# Patient Record
Sex: Male | Born: 1950 | Race: White | Hispanic: No | Marital: Married | Smoking: Former smoker
Health system: Southern US, Community
[De-identification: ages and names within clinical notes are randomized; demographics above are authoritative.]

## PROBLEM LIST (undated history)

## (undated) DIAGNOSIS — R7989 Other specified abnormal findings of blood chemistry: Secondary | ICD-10-CM

## (undated) DIAGNOSIS — H9190 Unspecified hearing loss, unspecified ear: Secondary | ICD-10-CM

## (undated) DIAGNOSIS — D126 Benign neoplasm of colon, unspecified: Secondary | ICD-10-CM

## (undated) DIAGNOSIS — R945 Abnormal results of liver function studies: Secondary | ICD-10-CM

## (undated) DIAGNOSIS — I10 Essential (primary) hypertension: Secondary | ICD-10-CM

## (undated) DIAGNOSIS — E669 Obesity, unspecified: Secondary | ICD-10-CM

## (undated) DIAGNOSIS — M1990 Unspecified osteoarthritis, unspecified site: Secondary | ICD-10-CM

## (undated) DIAGNOSIS — G4733 Obstructive sleep apnea (adult) (pediatric): Secondary | ICD-10-CM

## (undated) DIAGNOSIS — N400 Enlarged prostate without lower urinary tract symptoms: Secondary | ICD-10-CM

## (undated) DIAGNOSIS — E785 Hyperlipidemia, unspecified: Secondary | ICD-10-CM

## (undated) DIAGNOSIS — C4491 Basal cell carcinoma of skin, unspecified: Secondary | ICD-10-CM

## (undated) DIAGNOSIS — L718 Other rosacea: Secondary | ICD-10-CM

## (undated) HISTORY — DX: Unspecified osteoarthritis, unspecified site: M19.90

## (undated) HISTORY — DX: Obesity, unspecified: E66.9

## (undated) HISTORY — DX: Obstructive sleep apnea (adult) (pediatric): G47.33

## (undated) HISTORY — DX: Essential (primary) hypertension: I10

## (undated) HISTORY — DX: Benign neoplasm of colon, unspecified: D12.6

## (undated) HISTORY — DX: Benign prostatic hyperplasia without lower urinary tract symptoms: N40.0

## (undated) HISTORY — DX: Hyperlipidemia, unspecified: E78.5

## (undated) HISTORY — DX: Basal cell carcinoma of skin, unspecified: C44.91

## (undated) HISTORY — DX: Other rosacea: L71.8

## (undated) HISTORY — DX: Other specified abnormal findings of blood chemistry: R79.89

## (undated) HISTORY — DX: Unspecified hearing loss, unspecified ear: H91.90

## (undated) HISTORY — DX: Abnormal results of liver function studies: R94.5

## (undated) HISTORY — PX: TONSILLECTOMY: SUR1361

---

## 2004-01-22 LAB — HM COLONOSCOPY

## 2005-01-04 ENCOUNTER — Ambulatory Visit: Payer: Self-pay | Admitting: Cardiology

## 2005-03-08 ENCOUNTER — Ambulatory Visit: Payer: Self-pay | Admitting: Cardiology

## 2005-06-14 ENCOUNTER — Ambulatory Visit: Payer: Self-pay | Admitting: Cardiology

## 2007-01-09 ENCOUNTER — Ambulatory Visit: Payer: Self-pay | Admitting: Cardiology

## 2007-02-20 ENCOUNTER — Ambulatory Visit: Payer: Self-pay

## 2007-02-20 LAB — CONVERTED CEMR LAB
ALT: 77 units/L — ABNORMAL HIGH (ref 0–53)
AST: 47 units/L — ABNORMAL HIGH (ref 0–37)
Albumin: 4.1 g/dL (ref 3.5–5.2)
Alkaline Phosphatase: 44 units/L (ref 39–117)
BUN: 12 mg/dL (ref 6–23)
Calcium: 9.3 mg/dL (ref 8.4–10.5)
Chloride: 108 meq/L (ref 96–112)
Creatinine, Ser: 0.9 mg/dL (ref 0.4–1.5)
GFR calc non Af Amer: 92 mL/min
LDL Cholesterol: 67 mg/dL (ref 0–99)
PSA: 0.53 ng/mL (ref 0.10–4.00)
VLDL: 13 mg/dL (ref 0–40)

## 2007-03-27 ENCOUNTER — Ambulatory Visit: Payer: Self-pay | Admitting: Cardiology

## 2007-03-27 LAB — CONVERTED CEMR LAB
ALT: 75 units/L — ABNORMAL HIGH (ref 0–53)
AST: 51 units/L — ABNORMAL HIGH (ref 0–37)
Bilirubin, Direct: 0.1 mg/dL (ref 0.0–0.3)
Total Bilirubin: 0.9 mg/dL (ref 0.3–1.2)
Total Protein: 7 g/dL (ref 6.0–8.3)

## 2007-09-25 ENCOUNTER — Ambulatory Visit: Payer: Self-pay | Admitting: Cardiology

## 2008-05-11 DIAGNOSIS — E785 Hyperlipidemia, unspecified: Secondary | ICD-10-CM

## 2008-05-11 DIAGNOSIS — R030 Elevated blood-pressure reading, without diagnosis of hypertension: Secondary | ICD-10-CM | POA: Insufficient documentation

## 2008-05-13 ENCOUNTER — Encounter: Payer: Self-pay | Admitting: Cardiology

## 2008-05-13 ENCOUNTER — Ambulatory Visit: Payer: Self-pay | Admitting: Cardiology

## 2008-05-20 ENCOUNTER — Ambulatory Visit: Payer: Self-pay | Admitting: Cardiology

## 2008-05-23 ENCOUNTER — Encounter (INDEPENDENT_AMBULATORY_CARE_PROVIDER_SITE_OTHER): Payer: Self-pay | Admitting: *Deleted

## 2008-05-23 LAB — CONVERTED CEMR LAB
AST: 40 units/L — ABNORMAL HIGH (ref 0–37)
Albumin: 4 g/dL (ref 3.5–5.2)
Cholesterol: 130 mg/dL (ref 0–200)
Triglycerides: 109 mg/dL (ref 0.0–149.0)
VLDL: 21.8 mg/dL (ref 0.0–40.0)

## 2008-06-03 ENCOUNTER — Ambulatory Visit: Payer: Self-pay | Admitting: Internal Medicine

## 2008-06-03 DIAGNOSIS — M722 Plantar fascial fibromatosis: Secondary | ICD-10-CM | POA: Insufficient documentation

## 2008-06-03 DIAGNOSIS — M1990 Unspecified osteoarthritis, unspecified site: Secondary | ICD-10-CM

## 2008-08-01 ENCOUNTER — Telehealth: Payer: Self-pay | Admitting: Cardiology

## 2008-08-12 ENCOUNTER — Ambulatory Visit: Payer: Self-pay | Admitting: Cardiology

## 2008-08-15 ENCOUNTER — Encounter: Payer: Self-pay | Admitting: Cardiology

## 2008-08-19 ENCOUNTER — Telehealth: Payer: Self-pay | Admitting: Cardiology

## 2008-12-09 ENCOUNTER — Ambulatory Visit: Payer: Self-pay | Admitting: Internal Medicine

## 2008-12-09 DIAGNOSIS — R945 Abnormal results of liver function studies: Secondary | ICD-10-CM

## 2008-12-14 LAB — CONVERTED CEMR LAB
ALT: 65 units/L — ABNORMAL HIGH (ref 0–53)
AST: 41 units/L — ABNORMAL HIGH (ref 0–37)
BUN: 14 mg/dL (ref 6–23)
Creatinine, Ser: 1 mg/dL (ref 0.4–1.5)
GFR calc non Af Amer: 80.84 mL/min (ref >60)
HDL: 36.5 mg/dL — ABNORMAL LOW (ref >39.00)
Total CK: 480 units/L — ABNORMAL HIGH (ref 7–232)
VLDL: 28 mg/dL (ref 0.0–40.0)

## 2009-01-27 ENCOUNTER — Ambulatory Visit: Payer: Self-pay | Admitting: Internal Medicine

## 2009-01-30 LAB — CONVERTED CEMR LAB
ALT: 60 units/L — ABNORMAL HIGH (ref 0–53)
Total CHOL/HDL Ratio: 5

## 2009-03-24 ENCOUNTER — Telehealth (INDEPENDENT_AMBULATORY_CARE_PROVIDER_SITE_OTHER): Payer: Self-pay | Admitting: *Deleted

## 2009-06-09 ENCOUNTER — Ambulatory Visit: Payer: Self-pay | Admitting: Internal Medicine

## 2009-06-09 LAB — CONVERTED CEMR LAB
ALT: 64 units/L — ABNORMAL HIGH (ref 0–53)
AST: 41 units/L — ABNORMAL HIGH (ref 0–37)

## 2009-06-13 ENCOUNTER — Telehealth: Payer: Self-pay | Admitting: Internal Medicine

## 2009-06-13 DIAGNOSIS — H9190 Unspecified hearing loss, unspecified ear: Secondary | ICD-10-CM | POA: Insufficient documentation

## 2009-06-15 ENCOUNTER — Encounter (INDEPENDENT_AMBULATORY_CARE_PROVIDER_SITE_OTHER): Payer: Self-pay | Admitting: *Deleted

## 2009-07-26 ENCOUNTER — Encounter: Payer: Self-pay | Admitting: Internal Medicine

## 2009-08-06 ENCOUNTER — Encounter: Payer: Self-pay | Admitting: Internal Medicine

## 2009-08-07 ENCOUNTER — Encounter: Payer: Self-pay | Admitting: Internal Medicine

## 2009-08-09 ENCOUNTER — Telehealth (INDEPENDENT_AMBULATORY_CARE_PROVIDER_SITE_OTHER): Payer: Self-pay | Admitting: *Deleted

## 2009-09-14 ENCOUNTER — Telehealth (INDEPENDENT_AMBULATORY_CARE_PROVIDER_SITE_OTHER): Payer: Self-pay | Admitting: *Deleted

## 2009-09-15 DIAGNOSIS — G4733 Obstructive sleep apnea (adult) (pediatric): Secondary | ICD-10-CM

## 2009-09-22 ENCOUNTER — Ambulatory Visit: Payer: Self-pay | Admitting: Cardiology

## 2009-09-22 DIAGNOSIS — R351 Nocturia: Secondary | ICD-10-CM | POA: Insufficient documentation

## 2009-10-03 ENCOUNTER — Telehealth (INDEPENDENT_AMBULATORY_CARE_PROVIDER_SITE_OTHER): Payer: Self-pay | Admitting: *Deleted

## 2009-11-10 ENCOUNTER — Ambulatory Visit: Payer: Self-pay | Admitting: Cardiology

## 2009-11-14 LAB — CONVERTED CEMR LAB
ALT: 61 units/L — ABNORMAL HIGH (ref 0–53)
AST: 44 units/L — ABNORMAL HIGH (ref 0–37)
HDL: 30.5 mg/dL — ABNORMAL LOW (ref >39.00)
Total Bilirubin: 0.6 mg/dL (ref 0.3–1.2)
Triglycerides: 184 mg/dL — ABNORMAL HIGH (ref 0.0–149.0)

## 2009-11-15 ENCOUNTER — Telehealth: Payer: Self-pay | Admitting: Cardiology

## 2009-12-01 ENCOUNTER — Ambulatory Visit: Payer: Self-pay | Admitting: Internal Medicine

## 2009-12-11 LAB — CONVERTED CEMR LAB: ALT: 65 units/L — ABNORMAL HIGH (ref 0–53)

## 2010-02-18 LAB — CONVERTED CEMR LAB
ALT: 52 units/L (ref 0–53)
ALT: 79 units/L — ABNORMAL HIGH (ref 0–53)
AST: 36 units/L (ref 0–37)
Bilirubin, Direct: 0 mg/dL (ref 0.0–0.3)
HDL: 32.8 mg/dL — ABNORMAL LOW (ref >39.00)
PSA: 0.56 ng/mL (ref 0.10–4.00)
Total Bilirubin: 0.9 mg/dL (ref 0.3–1.2)
Total Bilirubin: 0.9 mg/dL (ref 0.3–1.2)
Total CHOL/HDL Ratio: 5
Total CK: 290 units/L — ABNORMAL HIGH (ref 7–232)
Total CK: 485 units/L — ABNORMAL HIGH (ref 7–232)
VLDL: 25.8 mg/dL (ref 0.0–40.0)

## 2010-02-20 NOTE — Assessment & Plan Note (Signed)
Summary: 6 month roa//lch   Vital Signs:  Patient profile:   60 year old male Weight:      265.13 pounds Pulse rate:   57 / minute Pulse rhythm:   regular BP sitting:   132 / 84  (left arm) Cuff size:   large  Vitals Entered By: Army Fossa CMA (December 01, 2009 10:36 AM) CC: 6 month f/u- fasting Comments [facility]   History of Present Illness: here for followup Since the  last office visit w/ me , he had blood work, his LFTs were slightly high, CK was in the 400s and he was having muscle aches and leg cramps. Cardiology discontinue Pravachol, labs were  rechecked-----> LFTs  were about the same, CK was not redone. he is not taking Pravachol  Review of systems Feeling well, no for muscle aches or cramps He is trying to walk 10 miles a week He is trying to eat healthier     Current Medications (verified): 1)  Aspirin 81 Mg Tbec (Aspirin) .... Take One Tablet By Mouth Daily 2)  Vitamin D 1000 Unit Tabs (Cholecalciferol) .Marland Kitchen.. 1 Tab Once Daily 3)  Naproxen 500 Mg Tabs (Naproxen) .Marland Kitchen.. 1 Tab Once Daily 4)  Fish Oil 1000 Mg Caps (Omega-3 Fatty Acids) .Marland Kitchen.. 1 Cap Once Daily 5)  Cpap Machine .... At Bedtime  Allergies (verified): No Known Drug Allergies  Past History:  Past Medical History: Reviewed history from 12/09/2008 and no changes required. HYPERTENSION, BORDERLINE   HYPERLIPIDEMIA-MIXED   OBESITY  Plantar fascitis increased LFTs  chronically     Past Surgical History: Reviewed history from 06/03/2008 and no changes required. Tonsillectomy  Social History: Reviewed history from 12/09/2008 and no changes required. 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   Lungs:  normal respiratory effort, no intercostal retractions, no accessory muscle use, and normal breath sounds.   Heart:  normal rate, regular rhythm, and no murmur.   Extremities:  no pretibial edema  bilaterally    Impression & Recommendations:  Problem # 1:  HYPERLIPIDEMIA-MIXED (ICD-272.4) he had muscle aches and cramps ,  CK was  in the 400s. Symptoms resolved after Pravachol was discontinued. CK was not recheck LFTs were rechecked and they are slt elevated  with or  without Pravachol   plan-- LFTs, CK. Restart Pravachol?   The following medications were removed from the medication list:    Pravachol 40 Mg Tabs (Pravastatin sodium) .Marland Kitchen... Take one tablet at bedtime -  Orders: Venipuncture (04540) TLB-CK Total Only(Creatine Kinase/CPK) (82550-CK) TLB-A1C / Hgb A1C (Glycohemoglobin) (83036-A1C) Specimen Handling (98119)  Problem # 2:  FUNCTION TESTS, ABNORMAL (ICD-794.9) LFTs are elevated slightly with or without pravachol He had a hepatitis serology consistent with her  previous Hep B. immunization  Orders: TLB-ALT (SGPT) (84460-ALT) TLB-AST (SGOT) (84450-SGOT) Specimen Handling (14782)  Complete Medication List: 1)  Aspirin 81 Mg Tbec (Aspirin) .... Take one tablet by mouth daily 2)  Vitamin D 1000 Unit Tabs (Cholecalciferol) .Marland Kitchen.. 1 tab once daily 3)  Naproxen 500 Mg Tabs (Naproxen) .Marland Kitchen.. 1 tab once daily 4)  Fish Oil 1000 Mg Caps (Omega-3 fatty acids) .Marland Kitchen.. 1 cap once daily 5)  Cpap Machine  .... At bedtime  Patient Instructions: 1)  Please schedule a follow-up appointment in 4 months .    Orders Added: 1)  Venipuncture [36415] 2)  TLB-ALT (SGPT) [84460-ALT] 3)  TLB-AST (SGOT) [84450-SGOT] 4)  TLB-CK Total Only(Creatine Kinase/CPK) [82550-CK] 5)  TLB-A1C / Hgb A1C (Glycohemoglobin) [83036-A1C] 6)  Specimen Handling [99000] 7)  Est. Patient Level III [56213]   Immunization History:  Influenza Immunization History:    Influenza:  historical (11/09/2009)   Immunization History:  Influenza Immunization History:    Influenza:  Historical (11/09/2009)

## 2010-02-20 NOTE — Progress Notes (Signed)
Summary: BLOOD RESULTS  Phone Note Call from Patient Call back at Home Phone [phone]   Caller: Patient Reason for Call: Talk to Nurse, Lab or Test Results Summary of Call: PT WANT TO KNOW BLOOD WORK RESULTS. PT WOULD LIKE TO HAVE A COPY FAXED TO HIM # [phone]. PT STATES NURSE CAN LEAVE A MESSAGE ON VM @ HOME. Initial call taken by: Roe Coombs,  November 15, 2009 1:31 PM  Follow-up for Phone Call        did we not call him? Please send ASAP. Follow-up by: Gaylord Shih, MD, [facility],  November 15, 2009 2:49 PM     Appended Document: BLOOD RESULTS Pt aware of results and faxed to him. Mylo Red RN

## 2010-02-20 NOTE — Progress Notes (Signed)
Summary: REFILL REQUEST  Phone Note Refill Request Call back at [phone] Message from:  Pharmacy on August 09, 2009 12:51 PM  Refills Requested: Medication #1:  PRAVACHOL 40 MG TABS take one tablet at bedtime -.   Dosage confirmed as above?Dosage Confirmed   Supply Requested: 1 month   Last Refilled: 07/03/2009 [facility]  [address] [place] [address]  Next Appointment Scheduled: NOV 19TH 2011 Initial call taken by: Lavell Islam,  August 09, 2009 12:52 PM    Prescriptions: PRAVACHOL 40 MG TABS (PRAVASTATIN SODIUM) take one tablet at bedtime -  #30 x 0   Entered by:   Army Fossa CMA   Authorized by:   Nolon Rod. Paz MD   Signed by:   Army Fossa CMA on 08/09/2009   Method used:   Print then Give to Patient   RxID:   [id number]  Faxed to pharmacy. Army Fossa CMA  August 09, 2009 1:09 PM

## 2010-02-20 NOTE — Consult Note (Signed)
Summary: RX-- sleep study----[facility]  [facility]   Imported By: Lanelle Bal 08/07/2009 12:34:47  _____________________________________________________________________  External Attachment:    Type:   Image     Comment:   External Document

## 2010-02-20 NOTE — Assessment & Plan Note (Signed)
Summary: 7mo f/u - jr   Vital Signs:  Patient profile:   60 year old male Height:      70.75 inches Weight:      261 pounds BMI:     36.79 Pulse rate:   60 / minute BP sitting:   130 / 82  Vitals Entered By: Shary Decamp (Jun 09, 2009 9:18 AM) CC: rov, fasting   History of Present Illness: routine office visit Here with his wife Feeling well  Current Medications (verified): 1)  Aspirin 81 Mg Tbec (Aspirin) .... Take One Tablet By Mouth Daily 2)  Vitamin D 2000 Unit Tabs (Cholecalciferol) .Marland Kitchen.. 1 Tab Once Daily 3)  Naproxen 500 Mg Tabs (Naproxen) .Marland Kitchen.. 1 Tab Once Daily 4)  Advil 200 Mg Tabs (Ibuprofen) .... As Needed 5)  Pravachol 40 Mg Tabs (Pravastatin Sodium) .... Take One Tablet At Bedtime -  Allergies (verified): No Known Drug Allergies  Past History:  Past Medical History: Reviewed history from 12/09/2008 and no changes required. HYPERTENSION, BORDERLINE   HYPERLIPIDEMIA-MIXED   OBESITY  Plantar fascitis increased LFTs  chronically     Past Surgical History: Reviewed history from 06/03/2008 and no changes required. Tonsillectomy  Social History: Reviewed history from 12/09/2008 and no changes required. 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  Physical Exam  General:  alert and well-developed.   Lungs:  normal respiratory effort, no intercostal retractions, no accessory muscle use, and normal breath sounds.   Heart:  normal rate, regular rhythm, and no murmur.   Abdomen:  soft, non-tender, no hepatomegaly, and no splenomegaly.   Skin:  no erythema palmaris   Impression  & Recommendations:  Problem # 1:  FUNCTION TESTS, ABNORMAL (ICD-794.9) long h/o increase in LFTs (> 20 years)  was told before he had a fatty liver  patient reports no previous ?u/s, ? hepatitis markers at some point, GI recommended a biopsy Interestingly his LFTs were normal on 07/2008 4 days  after he stopped vytorin today we discussed further testing, he agreed to have hepatitis markers He was started on Pravachol  and fortunately his LFTs remained stable  Orders: T-Hepatitis C Antibody ([address]) T-Hepatitis B Surface Antigen (09811-91478) T-Hepatitis B Core Antibody (29562-13086) T-Hepatitis B Surface Antibody (57846-96295)  Problem # 2:  HYPERTENSION, BORDERLINE (ICD-401.9) BP normal today BP today: 130/82 Prior BP: 110/80 (12/09/2008)  Labs Reviewed: K+: 5.1 (12/09/2008) Creat: : 1.0 (12/09/2008)   Chol: 157 (01/27/2009)   HDL: 34.30 (01/27/2009)   LDL: 102 (01/27/2009)   TG: 105.0 (01/27/2009)  Problem # 3:  HYPERLIPIDEMIA-MIXED (ICD-272.4) tolerates well Pravachol Monitor LFTs His updated medication list for this problem includes:    Pravachol 40 Mg Tabs (Pravastatin sodium) .Marland Kitchen... Take one tablet at bedtime -  Labs Reviewed: SGOT: 38 (01/27/2009)   SGPT: 60 (01/27/2009)   HDL:34.30 (01/27/2009), 36.50 (12/09/2008)  LDL:102 (01/27/2009), 77 (28/41/3244)  Chol:157 (01/27/2009), 231 (12/09/2008)  Trig:105.0 (01/27/2009), 140.0 (12/09/2008)  Orders: TLB-ALT (SGPT) (84460-ALT) TLB-AST (SGOT) (84450-SGOT)  Problem # 4:  HEALTH SCREENING (ICD-V70.0) tetanus shot 2010 shingles shot  05-2008  PSA 11-2008 had  a colonoscopy ([facility]) aprox 2007, + polyps      Complete Medication List: 1)  Aspirin 81 Mg Tbec (Aspirin) .... Take one tablet by mouth daily 2)  Vitamin D 2000 Unit Tabs (Cholecalciferol) .Marland Kitchen.. 1 tab once daily 3)  Naproxen 500 Mg Tabs (Naproxen) .Marland Kitchen.. 1 tab once daily 4)  Pravachol 40 Mg Tabs (Pravastatin sodium) .... Take one tablet at bedtime -  Patient  Instructions: 1)  Please schedule a follow-up appointment in 6 months .

## 2010-02-20 NOTE — Letter (Signed)
Summary: Primary Care Consult Scheduled Letter  [facility] [place]  [address] [place], [place] [address]   Phone: [phone]  Fax: [phone]      06/15/2009 MRN: [id number]  [facility] [address] [place], [place]  [address]    Dear Dale [name],    We have scheduled an appointment for you.  At the recommendation of Dr. Willow Ora, we have scheduled you a consult with Dr. Adriana Mccallum of [facility] on 06-23-2009 arrive by 9:45am.  Their address is [address], [place] [address]. The office phone number is [phone].  If this appointment day and time is not convenient for you, please feel free to call the office of the doctor you are being referred to at the number listed above and reschedule the appointment.    It is important for you to keep your scheduled appointments. We are here to make sure you are given good patient care.   Thank you,    Renee, Patient Care Coordinator [place] [facility]

## 2010-02-20 NOTE — Progress Notes (Signed)
Summary: REFILL  Phone Note Refill Request Message from:  Fax from Pharmacy on March 24, 2009 10:07 AM  Refills Requested: Medication #1:  PRAVACHOL 40 MG TABS take one tablet at bedtime. [facility] FAX [phone]   Method Requested: Fax to Local Pharmacy Next Appointment Scheduled: 06/09/09 Initial call taken by: Barb Merino,  March 24, 2009 10:12 AM    New/Updated Medications: PRAVACHOL 40 MG TABS (PRAVASTATIN SODIUM) take one tablet at bedtime - due office visit 05/2009 Prescriptions: PRAVACHOL 40 MG TABS (PRAVASTATIN SODIUM) take one tablet at bedtime - due office visit 05/2009  #30 x 3   Entered by:   Shary Decamp   Authorized by:   Nolon Rod. Paz MD   Signed by:   Shary Decamp on 03/24/2009   Method used:   Printed then faxed to ...         RxID:   [id number]

## 2010-02-20 NOTE — Progress Notes (Signed)
Summary: NEED LABS FAXED  Phone Note Call from Patient Call back at Home Phone [phone] Call back at [phone]   Caller: Patient Summary of Call: PT WOULD LIKE LABS FAXED TO HIM ,HE DID NOT GET THEM YESTERDAY Initial call taken by: Judie Grieve,  October 03, 2009 10:52 AM  Follow-up for Phone Call        fax'd to [phone]. Claris Gladden, RN

## 2010-02-20 NOTE — Progress Notes (Signed)
Summary: lab results  Phone Note Outgoing Call   Summary of Call: advise patient LFTs are stable Hepatitis serology negative, it does indicate that at some point he had the  hepatitis B shots ( if he never had them --->let me know) Lizzeth Meder E. Debralee Braaksma MD  Jun 13, 2009 5:39 PM   Follow-up for Phone Call        discussed with wife.  patient was to have a audiology referral @ last ov due to hearing problems......Marland Kitchen referral done, copy of labs mailed to pt....Marland KitchenMarland KitchenShary Decamp  Jun 14, 2009 4:02 PM   New Problems: DECREASED HEARING (ICD-389.9)   New Problems: DECREASED HEARING (ICD-389.9)

## 2010-02-20 NOTE — Assessment & Plan Note (Signed)
Summary: follow up 1 year/mt   Visit Type:  1 yr f/u Primary Cederic Mozley:  Nolon Rod. Paz MD  CC:  no cardiac complaints today.  History of Present Illness: Mr. Windmiller returns today for his cardiovascular risk factors including hypertension, hyperlipidemia, obesity, and obstructive sleep apnea.  He has been wearing his CPAP partially through the night for the last month. His daytime sleepiness has improved. His energy has not improved yet. He admits to the fact that his mask is uncomfortable and he only wears it for about half the night.  He had muscle aches with elevated CPK last fall. He was switched to Pravachol but has not returned for blood work. He denies muscle aches now.  His weight has not changed. Stress level is high. He's compliant with his medications. He's had no angina.  Clinical Reports Reviewed:  Nuclear Study:  02/20/2007:  Excerise capacity: Fair exercise capacity  Blood Pressure response: Normal blood pressure response  Clinical symptoms: No chest pain or dyspnea  ECG impression: No significant ST segment change suggestive of ischemia  Overall impression: There is no sign of scar or ischemia  Olga Millers, MD   10/05/2002:  Impression: Negative stress Cardiolite study revealing impaired exercise capacity, a normal stress EKG, normal left ventricular systolic function, and normal left ventricular size. By scintigraphic imaging, there was normal myocardial pefusion without evidence for ischemia or infarction. Other findings as noted.   Gerrit Friends. Dietrich Pates, MD, [facility]   Current Medications (verified): 1)  Aspirin 81 Mg Tbec (Aspirin) .... Take One Tablet By Mouth Daily 2)  Vitamin D 1000 Unit Tabs (Cholecalciferol) .Marland Kitchen.. 1 Tab Once Daily 3)  Naproxen 500 Mg Tabs (Naproxen) .Marland Kitchen.. 1 Tab Once Daily 4)  Pravachol 40 Mg Tabs (Pravastatin Sodium) .... Take One Tablet At Bedtime - 5)  Fish Oil 1000 Mg Caps (Omega-3 Fatty Acids) .Marland Kitchen.. 1 Cap Once Daily 6)  Cpap Machine  .... At Bedtime  Allergies (verified): No Known Drug Allergies  Review of Systems       negative other than history of present illness  Vital Signs:  Patient profile:   60 year old male Height:      70.75 inches Weight:      260.8 pounds BMI:     36.76 Pulse rate:   60 / minute Pulse rhythm:   regular BP sitting:   114 / 70  (left arm) Cuff size:   large  Vitals Entered By: Danielle Rankin, CMA (September 22, 2009 10:05 AM)  Physical Exam  General:  obese.  acute distress Head:  normocephalic and atraumatic Eyes:  PERRLA/EOM intact; conjunctiva and lids normal. Neck:  Neck supple, no JVD. No masses, thyromegaly or abnormal cervical nodes. Chest Wall:  no deformities or breast masses noted Lungs:  Clear bilaterally to auscultation and percussion. Heart:  PMI poorly appreciated, normal S1-S2, regular rate and rhythm, carotids equal bilaterally without bruits Msk:  decreased ROM.  no muscle tenderness Pulses:  pulses normal in all 4 extremities Extremities:  No clubbing or cyanosis. Neurologic:  Alert and oriented x 3. Skin:  Intact without lesions or rashes. Psych:  Normal affect.   Impression & Recommendations:  Problem # 1:  OBSTRUCTIVE SLEEP APNEA (ICD-327.23) Assessment Improved He has been advised to have his CPAP machine and mask adjusted. Hopefully can start wearing it throughout the night and received full benefit. It is artery had a significant impact on his blood pressure.  Problem # 2:  HYPERTENSION, BORDERLINE (ICD-401.9) Assessment: Improved  His updated medication list for this problem includes:    Aspirin 81 Mg Tbec (Aspirin) .Marland Kitchen... Take one tablet by mouth daily  Orders: EKG w/ Interpretation (93000) TLB-Lipid Panel (80061-LIPID) TLB-Hepatic/Liver Function Pnl (80076-HEPATIC) TLB-CK Total Only(Creatine Kinase/CPK) (82550-CK)  Problem # 3:  HYPERLIPIDEMIA-MIXED (ICD-272.4)  He has not had labs sent starting Pravachol. He also like to have a CPK  though he does not have any significant muscle aches. He clearly had it with Vytorin. His updated medication list for this problem includes:    Pravachol 40 Mg Tabs (Pravastatin sodium) .Marland Kitchen... Take one tablet at bedtime -  Orders: EKG w/ Interpretation (93000) TLB-Lipid Panel (80061-LIPID) TLB-Hepatic/Liver Function Pnl (80076-HEPATIC) TLB-CK Total Only(Creatine Kinase/CPK) (82550-CK)  Problem # 4:  OBESITY (ICD-278.00) Assessment: Unchanged  Other Orders: TLB-PSA (Prostate Specific Antigen) (84153-PSA)  Patient Instructions: 1)  Your physician recommends that you schedule a follow-up appointment in: 1 year with Dr. Daleen Squibb 2)  Your physician recommends that you haveFASTING lipid profile, liver cpk, psa  3)  Your physician recommends that you continue on your current medications as directed. Please refer to the Current Medication list given to you today.

## 2010-02-20 NOTE — Progress Notes (Signed)
Summary: REFILL REQUEST  Phone Note Refill Request Call back at [phone] Message from:  Pharmacy on September 14, 2009 2:27 PM  Refills Requested: Medication #1:  PRAVACHOL 40 MG TABS take one tablet at bedtime -.   Dosage confirmed as above?Dosage Confirmed   Supply Requested: 1 month   Last Refilled: 08/09/2009 [facility] [address] [id number]  Next Appointment Scheduled: 9.2.11 Initial call taken by: Lavell Islam,  September 14, 2009 2:29 PM    Prescriptions: PRAVACHOL 40 MG TABS (PRAVASTATIN SODIUM) take one tablet at bedtime -  #30 x 0   Entered by:   Army Fossa CMA   Authorized by:   Nolon Rod. Paz MD   Signed by:   Army Fossa CMA on 09/15/2009   Method used:   Printed then faxed to ...         RxID:   [id number]

## 2010-02-20 NOTE — Letter (Signed)
Summary: Rx a CPAP  [facility]   Imported By: Lanelle Bal 08/14/2009 13:57:53  _____________________________________________________________________  External Attachment:    Type:   Image     Comment:   External Document

## 2010-03-23 ENCOUNTER — Other Ambulatory Visit: Payer: Self-pay | Admitting: Internal Medicine

## 2010-03-23 ENCOUNTER — Ambulatory Visit (INDEPENDENT_AMBULATORY_CARE_PROVIDER_SITE_OTHER): Payer: Self-pay | Admitting: Internal Medicine

## 2010-03-23 ENCOUNTER — Encounter: Payer: Self-pay | Admitting: Internal Medicine

## 2010-03-23 DIAGNOSIS — R948 Abnormal results of function studies of other organs and systems: Secondary | ICD-10-CM

## 2010-03-23 DIAGNOSIS — E785 Hyperlipidemia, unspecified: Secondary | ICD-10-CM

## 2010-03-23 LAB — LIPID PANEL
Cholesterol: 158 mg/dL (ref 0–200)
HDL: 37.3 mg/dL — ABNORMAL LOW (ref >39.00)
LDL Cholesterol: 103 mg/dL — ABNORMAL HIGH (ref 0–99)
Total CHOL/HDL Ratio: 4
Triglycerides: 87 mg/dL (ref 0.0–149.0)
VLDL: 17.4 mg/dL (ref 0.0–40.0)

## 2010-03-23 LAB — CK: Total CK: 469 U/L — ABNORMAL HIGH (ref 7–232)

## 2010-03-23 LAB — HEPATIC FUNCTION PANEL
Albumin: 4.2 g/dL (ref 3.5–5.2)
Bilirubin, Direct: 0.1 mg/dL (ref 0.0–0.3)
Total Protein: 6.8 g/dL (ref 6.0–8.3)

## 2010-03-29 NOTE — Assessment & Plan Note (Signed)
Summary: 4 MONTH ROV/NTA   Vital Signs:  Patient profile:   60 year old male Weight:      262.13 pounds Pulse rate:   76 / minute Pulse rhythm:   regular BP sitting:   128 / 84  (left arm) Cuff size:   large  Vitals Entered By: Army Fossa CMA (March 23, 2010 9:14 AM) CC: 4 month f/u- fasting  Comments [facility]    History of Present Illness:  since the last office visit he is doing well, he started Lipitor, no apparent side effects   Review of systems  diet no healthy lately  he continues to exercise routinely No claudication perse but sometimes his legs get tired when he starts walking, as he cont. walking they feel ok  Current Medications (verified): 1)  Aspirin 81 Mg Tbec (Aspirin) .... Take One Tablet By Mouth Daily 2)  Vitamin D 1000 Unit Tabs (Cholecalciferol) .Marland Kitchen.. 1 Tab Once Daily 3)  Naproxen 500 Mg Tabs (Naproxen) .Marland Kitchen.. 1 Tab Once Daily 4)  Fish Oil 1000 Mg Caps (Omega-3 Fatty Acids) .Marland Kitchen.. 1 Cap Once Daily 5)  Cpap Machine .... At Bedtime 6)  Lipitor 10 Mg Tabs (Atorvastatin Calcium) .Marland Kitchen.. 1 By Mouth At Bedtime.  Allergies (verified): No Known Drug Allergies  Past History:  Past Medical History: Reviewed history from 12/09/2008 and no changes required. HYPERTENSION, BORDERLINE   HYPERLIPIDEMIA-MIXED   OBESITY  Plantar fascitis increased LFTs  chronically     Physical Exam  General:  alert and well-developed.   Lungs:  normal respiratory effort, no intercostal retractions, no accessory muscle use, and normal breath sounds.   Heart:  normal rate, regular rhythm, and no murmur.   Abdomen:  soft, non-tender, no hepatomegaly, and no splenomegaly.     Impression & Recommendations:  Problem # 1:  FUNCTION TESTS, ABNORMAL (ICD-794.9)   chronically elevated LFTs for many years We recently started Lipitor Recheck LFTs to ensure they are close to baseline   he also has moderately increased CKs , recheck CKs again to be sure they're close to baseline. At  some point we may need to refer him for further workup  (without insurance that can be very costly)  Orders: TLB-Hepatic/Liver Function Pnl (80076-HEPATIC) Specimen Handling ([address])  Problem # 2:  HYPERLIPIDEMIA-MIXED (ICD-272.4)   tolerating Lipitor well , labs His updated medication list for this problem includes:    Lipitor 10 Mg Tabs (Atorvastatin calcium) .Marland Kitchen... 1 by mouth at bedtime.  Labs Reviewed: SGOT: 42 (12/01/2009)   SGPT: 65 (12/01/2009)   HDL:30.50 (11/10/2009), 32.80 (09/22/2009)  LDL:121 (11/10/2009), 110 (09/22/2009)  Chol:188 (11/10/2009), 169 (09/22/2009)  Trig:184.0 (11/10/2009), 129.0 (09/22/2009)  Orders: Venipuncture ([address]) TLB-Lipid Panel (80061-LIPID) TLB-CK Total Only(Creatine Kinase/CPK) (82550-CK) Specimen Handling (09811)  Complete Medication List: 1)  Aspirin 81 Mg Tbec (Aspirin) .... Take one tablet by mouth daily 2)  Vitamin D 1000 Unit Tabs (Cholecalciferol) .Marland Kitchen.. 1 tab once daily 3)  Naproxen 500 Mg Tabs (Naproxen) .Marland Kitchen.. 1 tab once daily 4)  Fish Oil 1000 Mg Caps (Omega-3 fatty acids) .Marland Kitchen.. 1 cap once daily 5)  Cpap Machine  .... At bedtime 6)  Lipitor 10 Mg Tabs (Atorvastatin calcium) .Marland Kitchen.. 1 by mouth at bedtime.  Patient Instructions: 1)  Please schedule a follow-up appointment in 4 to 5  months, physical exam    Orders Added: 1)  Venipuncture [36415] 2)  TLB-Lipid Panel [80061-LIPID] 3)  TLB-Hepatic/Liver Function Pnl [80076-HEPATIC] 4)  TLB-CK Total Only(Creatine Kinase/CPK) [82550-CK] 5)  Specimen  Handling [99000] 6)  Est. Patient Level II [91478]

## 2010-04-04 ENCOUNTER — Telehealth (INDEPENDENT_AMBULATORY_CARE_PROVIDER_SITE_OTHER): Payer: Self-pay | Admitting: *Deleted

## 2010-04-10 NOTE — Progress Notes (Signed)
Summary: Lipitor refill  Phone Note Refill Request Message from:  Fax from Pharmacy on April 04, 2010 12:47 PM  Refills Requested: Medication #1:  LIPITOR 10 MG TABS 1 by mouth at bedtime.[facility] [address] [facility]  [address] [place], [place]   phone = [phone],    fax = [phone]   qty = 30  Next Appointment Scheduled: Fri 8/3   [facility] Initial call taken by: Jerolyn Shin,  April 04, 2010 12:48 PM    Prescriptions: LIPITOR 10 MG TABS (ATORVASTATIN CALCIUM) 1 by mouth at bedtime.  #30 x 5   Entered by:   Army Fossa CMA   Authorized by:   Nolon Rod. Paz MD   Signed by:   Army Fossa CMA on 04/04/2010   Method used:   Printed then faxed to ...         RxID:   [id number]

## 2010-06-05 NOTE — Assessment & Plan Note (Signed)
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  Unfortunately, she has been diagnosed with uterine cancer with a large  pelvic mass in June of this year.  She is on chemo and she has lost lot  of weight and her hair.  It has been a very difficult time for their  family and him as well.   He has had a remarkable response with his mixed hyperlipidemia to  Vytorin.  His LFTs have been mildly increased, but have been stable.  We  checked them several times.   He is having no chest pain or angina.  He is not very active and she is  trying to encourage him to walk on his treadmill.  I have tried to  reinforce that today.   MEDICATIONS:  1. Vytorin 10/40 daily.  2. Enteric-coated aspirin 81 mg a day.  3. Vitamin D.  4. Naproxen 500 mg a day.   PHYSICAL EXAMINATION:  VITAL SIGNS:  His blood pressure is 126/76, pulse  72 and regular, his weight is 254, which is stable.  HEENT:  Normal.  NECK:  Carotid upstrokes are equal bilaterally without bruits.  No JVD.  Thyroid is not enlarged.  Trachea is midline.  LUNGS:  Clear.  HEART:  Regular rate and rhythm.  Soft S1 and S2.  ABDOMEN:  Protuberant, good bowel sounds.  No obvious organomegaly.  No  pulsatile mass.  EXTREMITIES:  No cyanosis, clubbing, or edema.  Pulses are intact.  NEURO:  Intact.   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  During the stressful  time, I have asked him to try to walk 3 hours a week on his treadmill,  which will make him feel better and it will not only reduce his  cardiovascular risk but reduce stress.  It is not a good time to try to  lose weight because he is a stress eater.  We talked quite openly about  this.  He will stay on his  Vytorin and aspirin.  I will see him back in  February 2010.     Thomas C. Daleen Squibb, MD, [facility]  Electronically Signed    TCW/MedQ  DD: 09/25/2007  DT: 09/26/2007  Job #: [id number]

## 2010-06-05 NOTE — Assessment & Plan Note (Signed)
[name]                            CARDIOLOGY OFFICE NOTE   NAME:[name], [name]                   MRN:          [id number]  DATE:03/27/2007                            DOB:          October 28, 1950    REASON FOR VISIT:  Dr. Greer [name] returns today for close followup of the  following issues:  1. Mixed hyperlipidemia.  He has had an excellent response to Vytorin      with total cholesterol of 112, triglycerides 66, HDL 31.7, LDL 67.      His LFTs were remarkable for  AST of 47, ALT is 77.  I suspect this      is probably from fatty liver.  We will repeat this today.  2. Obesity.  3. Sedentary lifestyle.  4. Borderline hypertension.  His blood pressure at home is about      120/80.  He has lost 5 pounds in several months.  At this rate, he      will lose 20 a year which is great!  I have reinforced this today.      I have also encouraged him to walk 3 hours week on a treadmill.   PHYSICAL EXAMINATION:  VITAL SIGNS:  Blood pressure 125/79, pulse 57 and  regular, weight 249.  HEENT:  Unchanged.  NECK:  Carotid upstrokes are equal bilaterally without bruits, no JVD.  Thyroid is not enlarged.  Trachea is midline.  LUNGS:  Clear.  HEART:  Reveals a nondisplaced PMI, soft S1-S2.  ABDOMEN:  Soft, good bowel sounds.  There is no hepatomegaly or hepatic  tenderness.  EXTREMITIES:  No cyanosis or clubbing.  There is trace edema.  Pulses  are intact.  NEURO:  Intact.   ASSESSMENT/PLAN:  Mr. Dale [name] is doing well.  He has had an excellent  response to Vytorin.  He has lost 5 pounds.  I have encouraged him to  walk and to try to lose 5 pounds every several months.  At this rate, he  will lose 20 pounds which would be great by the time we see him back.   I will check LFTs today to follow up on his slightly elevated  transaminases.  We will get him scheduled to see Dr. Willow [name], his  primary care doctor along with his wife in  [place].  I will  see him back again in 6 months.     Dale C. Daleen Squibb, MD, [facility]  Electronically Signed    TCW/MedQ  DD: 03/27/2007  DT: 03/28/2007  Job #: [id number]   cc:   Dale Ora, MD

## 2010-06-05 NOTE — Assessment & Plan Note (Signed)
[facility]                            CARDIOLOGY OFFICE NOTE   NAME:Dale [name], Dale [name]                   MRN:          [id number]  DATE:01/09/2007                            DOB:          04-17-1950    Dr. Greer Pickerel returns today for further management of the following  issues:  1. Mixed hyperlipidemia.  2. Hypertension.  3. Sedentary lifestyle.  4. Obesity.   Since I last saw him, he discontinued Vytorin because of fatty liver.  We knew that his liver enzymes were mildly elevated back in 2004.  His  transaminases on Jun 14, 2005 were 50 and 84 on Vytorin 10/40.  He had a  really good result with the Vytorin with his LDL dropping down to 73.  His LDL was always low and was down at 29.   He became what sounds like orthostatic, taking Benicar 1 time.  He  discontinued it.   He does not check his blood pressure on a regular basis.   He is currently having no symptoms of angina.   CURRENT MEDICATIONS:  Naproxen 500 mg p.o. b.i.d.   He denies orthopnea, PND, or peripheral edema.   PHYSICAL EXAMINATION:  His blood pressure today is 133/82.  His pulse 60  and regular. His EKG is normal.  Weight is 254.  HEENT:  Normocephalic, atraumatic.  PERRLA.  Extraocular movements  intact.  Sclerae are clear.  Facial symmetry is normal.  NECK:  Carotid upstrokes are equal bilaterally without bruits.  No JVD.  Thyroid is not enlarged.  Trachea is midline.  LUNGS:  Clear.  HEART:  Reveals a nondisplaced PMI, but poorly appreciated.  Normal S1  and S2, without any murmur, rub, or gallop.  ABDOMEN:  Protuberant.  Good bowel sounds.  No midline bruit.  No  hepatomegalia.  EXTREMITIES:  No cyanosis, clubbing, or edema.  Pulses are intact.  NEUROLOGIC:  Intact.   ASSESSMENT AND PLAN:  I had about a 30 minute discussion with Dr.  Greer Pickerel and his wife.  I have strongly recommended treatment to  stabilize his plaques and decrease his risk of future vascular  events.  I do not think Vytorin is contraindicated if his LFTs are 3 times normal  or higher.  He also probably needs his blood pressure treated, but I  would like to get some readings.   PLAN:  1. Begin Vytorin 10/40.  2. Check lipids and comprehensive metabolic panel when he returns in 6      weeks.  3. Exercise rest stress Myoview.   I have encouraged him to lose weight.  I have encouraged him to try to  increase his activities.  He will check his blood pressure.  If he is  running about 135/85, will recommend antihypertensive therapy.  Otherwise I will see him back in 3 months.     Thomas C. Daleen Squibb, MD, [facility]  Electronically Signed    TCW/MedQ  DD: 01/09/2007  DT: 01/10/2007  Job #: [id number]

## 2010-06-07 ENCOUNTER — Encounter: Payer: Self-pay | Admitting: Internal Medicine

## 2010-06-08 ENCOUNTER — Ambulatory Visit (INDEPENDENT_AMBULATORY_CARE_PROVIDER_SITE_OTHER): Payer: Self-pay | Admitting: Internal Medicine

## 2010-06-08 ENCOUNTER — Encounter: Payer: Self-pay | Admitting: Internal Medicine

## 2010-06-08 VITALS — BP 130/88 | HR 65 | Wt 261.8 lb

## 2010-06-08 DIAGNOSIS — M21379 Foot drop, unspecified foot: Secondary | ICD-10-CM

## 2010-06-08 DIAGNOSIS — M216X9 Other acquired deformities of unspecified foot: Secondary | ICD-10-CM

## 2010-06-08 NOTE — Progress Notes (Signed)
  Subjective:    Patient ID: Dale [name], male    DOB: 12/27/1950, 60 y.o.   MRN: [id number]  HPI ~ 1 month ago, jumped from a rock (the height of a table) landed on his feet. 2 days later noted pain at the L lower back, worse if he stands for long. Also noted weakness at the muscles in the external side of the calves. L foot drop? Past Medical History  Diagnosis Date  . Hypertension   . Hyperlipidemia     mixed  . Obesity   . Plantar fasciitis   . Elevated liver function tests    Past Surgical History  Procedure Date  . 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 Neurological exam: Motor symmetric DTRs symmetric, slightly decreased throughout the lower extremities. Pinprick examination of the lower extremity normal Gait normal except for a very subtle left foot drop. Back: No tender to palpation       Assessment & Plan:

## 2010-06-08 NOTE — Assessment & Plan Note (Signed)
Develop back pain and foot drop 2 days after he jumped. Neurological exam showed a very subtle left foot drop and symmetrically decreased DTRs. I would like to get a neurologists opinion, and see  if further testing is needed v. observation

## 2010-06-11 NOTE — Progress Notes (Signed)
Addended by: Army Fossa on: 06/11/2010 02:16 PM   Modules accepted: Orders

## 2010-08-14 ENCOUNTER — Encounter: Payer: Self-pay | Admitting: Cardiology

## 2010-08-17 ENCOUNTER — Ambulatory Visit (INDEPENDENT_AMBULATORY_CARE_PROVIDER_SITE_OTHER): Payer: Self-pay | Admitting: Cardiology

## 2010-08-17 ENCOUNTER — Encounter: Payer: Self-pay | Admitting: Cardiology

## 2010-08-17 VITALS — BP 124/78 | HR 56 | Ht 70.0 in | Wt 261.8 lb

## 2010-08-17 DIAGNOSIS — M216X9 Other acquired deformities of unspecified foot: Secondary | ICD-10-CM

## 2010-08-17 DIAGNOSIS — E785 Hyperlipidemia, unspecified: Secondary | ICD-10-CM

## 2010-08-17 DIAGNOSIS — M21379 Foot drop, unspecified foot: Secondary | ICD-10-CM

## 2010-08-17 DIAGNOSIS — R948 Abnormal results of function studies of other organs and systems: Secondary | ICD-10-CM

## 2010-08-17 DIAGNOSIS — E669 Obesity, unspecified: Secondary | ICD-10-CM

## 2010-08-17 DIAGNOSIS — I10 Essential (primary) hypertension: Secondary | ICD-10-CM

## 2010-08-17 NOTE — Assessment & Plan Note (Signed)
Improved.  No change in treatment 

## 2010-08-17 NOTE — Assessment & Plan Note (Signed)
He is under well on atorvastatin. Values reviewed. No change in treatment.

## 2010-08-17 NOTE — Assessment & Plan Note (Signed)
Encouraged to increase activity and lose weight.

## 2010-08-17 NOTE — Progress Notes (Signed)
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 Hypertension, and obesity.  He injured his back and suffered left foot drop. He has been much less active since then. He is getting ready to train for a race this fall.  He denies any angina or chest discomfort. He has had no significant dyspnea on exertion, orthopnea, or edema.  He is tolerating atorvastatin well. His numbers look the best in March that they have looked. He only has a slight elevation in his LFTs.    EKG today shows sinus bradycardia otherwise normal.a Past Medical History  Diagnosis Date  . Hypertension   . Hyperlipidemia     mixed  . Obesity   . Plantar fasciitis   . Elevated liver function tests     Past Surgical History  Procedure Date  . Tonsillectomy     Family History  Problem Relation Age of Onset  . Prostate cancer Father   . Prostate cancer Father   . Prostate cancer      uncle  . Colon cancer Neg Hx   . Coronary artery disease Paternal Grandmother   . Coronary artery disease Mother     CHF  . Diabetes Mother   . Hypertension Mother   . Stroke Neg Hx     History   Social History  . Marital Status: Married    Spouse Name: N/A    Number of Children: 3  . Years of Education: N/A   Occupational History  . dentist    Social History Main Topics  . Smoking status: Former Games developer  . Smokeless tobacco: Not on file  . Alcohol Use: Yes     socially  . Drug Use: No  . Sexually Active: Not on file   Other Topics Concern  . Not on file   Social History Narrative  . No narrative on file    No Known Allergies  Current Outpatient Prescriptions  Medication Sig Dispense Refill  . aspirin 81 MG tablet Take 81 mg by mouth daily.        Marland Kitchen atorvastatin (LIPITOR) 10 MG tablet Take 10 mg by mouth daily.        . Cholecalciferol (VITAMIN D) 1000 UNITS capsule Take 1,000 Units by mouth daily.        . fish oil-omega-3  fatty acids 1000 MG capsule Take 2 g by mouth daily.        . naproxen (NAPROSYN) 500 MG tablet Take 500 mg by mouth daily.          ROS Negative other than HPI.   PE General Appearance: well developed, well nourished in no acute distress, obese HEENT: symmetrical face, PERRLA, good dentition  Neck: no JVD, thyromegaly, or adenopathy, trachea midline Chest: symmetric without deformity Cardiac: PMI non-displaced, RRR, normal S1, S2, no gallop or murmur Lung: clear to ausculation and percussion Vascular: all pulses full without bruits  Abdominal: nondistended, nontender, good bowel sounds, no HSM, no bruits Extremities: no cyanosis, clubbing or edema, no sign of DVT, no varicosities  Skin: normal color, no rashes Neuro: alert and oriented x 3, non-focal Pysch: normal affectwill Filed Vitals:   08/17/10 1023  BP: 124/78  Pulse: 56  Height: 5\' 10"  (1.778 m)  Weight: 261 lb 12.8 oz (118.752 kg)    EKG  Labs and Studies Reviewed.   No results found for this basename: WBC, HGB, HCT, MCV, PLT      Chemistry  Component Value Date/Time   NA 145 12/09/2008 1002   K 5.1 12/09/2008 1002   CL 108 12/09/2008 1002   CO2 30 12/09/2008 1002   BUN 14 12/09/2008 1002   CREATININE 1.0 12/09/2008 1002      Component Value Date/Time   CALCIUM 9.4 12/09/2008 1002   ALKPHOS 50 03/23/2010 1006   AST 35 03/23/2010 1006   ALT 46 03/23/2010 1006   BILITOT 1.1 03/23/2010 1006       Lab Results  Component Value Date   CHOL 158 03/23/2010   CHOL 188 11/10/2009   CHOL 169 09/22/2009   Lab Results  Component Value Date   HDL 37.30* 03/23/2010   HDL 16.10* 11/10/2009   HDL 32.80* 09/22/2009   Lab Results  Component Value Date   LDLCALC 103* 03/23/2010   LDLCALC 121* 11/10/2009   LDLCALC 110* 09/22/2009   Lab Results  Component Value Date   TRIG 87.0 03/23/2010   TRIG 184.0* 11/10/2009   TRIG 129.0 09/22/2009   Lab Results  Component Value Date   CHOLHDL 4 03/23/2010   CHOLHDL 6 11/10/2009    CHOLHDL 5 09/22/2009   Lab Results  Component Value Date   HGBA1C 5.4 12/01/2009   Lab Results  Component Value Date   ALT 46 03/23/2010   AST 35 03/23/2010   ALKPHOS 50 03/23/2010   BILITOT 1.1 03/23/2010   No results found for this basename: TSH  her

## 2010-08-17 NOTE — Patient Instructions (Signed)
Your physician recommends that you schedule a follow-up appointment in: 1 year with Dr. Wall  

## 2010-08-24 ENCOUNTER — Encounter: Payer: Self-pay | Admitting: Internal Medicine

## 2010-08-31 ENCOUNTER — Ambulatory Visit (INDEPENDENT_AMBULATORY_CARE_PROVIDER_SITE_OTHER): Payer: Self-pay | Admitting: Internal Medicine

## 2010-08-31 ENCOUNTER — Encounter: Payer: Self-pay | Admitting: Internal Medicine

## 2010-08-31 DIAGNOSIS — M21379 Foot drop, unspecified foot: Secondary | ICD-10-CM

## 2010-08-31 DIAGNOSIS — Z0000 Encounter for general adult medical examination without abnormal findings: Secondary | ICD-10-CM | POA: Insufficient documentation

## 2010-08-31 DIAGNOSIS — M778 Other enthesopathies, not elsewhere classified: Secondary | ICD-10-CM | POA: Insufficient documentation

## 2010-08-31 DIAGNOSIS — H9190 Unspecified hearing loss, unspecified ear: Secondary | ICD-10-CM

## 2010-08-31 LAB — TSH: TSH: 1.71 u[IU]/mL (ref 0.35–5.50)

## 2010-08-31 LAB — HEMOGLOBIN A1C: Hgb A1c MFr Bld: 5.4 % (ref 4.6–6.5)

## 2010-08-31 LAB — BASIC METABOLIC PANEL
BUN: 17 mg/dL (ref 6–23)
Calcium: 9.4 mg/dL (ref 8.4–10.5)
Creatinine, Ser: 1 mg/dL (ref 0.4–1.5)

## 2010-08-31 LAB — LIPID PANEL
HDL: 37.9 mg/dL — ABNORMAL LOW (ref >39.00)
LDL Cholesterol: 86 mg/dL (ref 0–99)
Total CHOL/HDL Ratio: 4
Triglycerides: 107 mg/dL (ref 0.0–149.0)

## 2010-08-31 NOTE — Assessment & Plan Note (Signed)
Likes to try PT 2 more weeks, if no better will call ortho, see instructions

## 2010-08-31 NOTE — Patient Instructions (Addendum)
Please call the  gastroenterologist and find out when you are due for a colonoscopy . [facility]  [address], [place] [address] Phone: [phone] Fax: [phone]

## 2010-08-31 NOTE — Assessment & Plan Note (Signed)
Worse on the left, has seen audiology before

## 2010-08-31 NOTE — Progress Notes (Signed)
  Subjective:    Patient ID: UZZIAH RIGG, male    DOB: Jun 03, 1950, 60 y.o.   MRN: [id number]  HPI Complaint physical exam Pain at left arm from the base of the thumb radiating to the forearm----> Diagnosed with tendinitis, status post physical therapy, still hurting.  Past Medical History  Diagnosis Date  . Hypertension   . Hyperlipidemia     mixed  . Obesity   . Elevated liver function tests   . Decreased hearing     Left, saw audiologist in 2011 per pt   . OSA (obstructive sleep apnea)     on CPAP   Past Surgical History  Procedure Date  . Tonsillectomy    Family History  Problem Relation Age of Onset  . Prostate cancer      F age 52s died at 58, uncle dx at age 33s   . Colon cancer Neg Hx   . Coronary artery disease Paternal Grandmother     M (CHF) , GM  . Diabetes Mother   . Hypertension Mother   . Stroke Neg Hx     Social History:  Married , 3 children Tobacco Use - quit 1979 Drug Use - no Occupation:  Education officer, community    Diet- not good at all (butter! Sugar!) Exercise-- training for a 5 K run  Review of Systems  Respiratory: Negative for cough and shortness of breath.   Cardiovascular: Negative for chest pain and leg swelling.  Gastrointestinal: Negative for abdominal pain and blood in stool.  Genitourinary: Negative for dysuria, hematuria and difficulty urinating.  Psychiatric/Behavioral:       No anxiety-depression       Objective:   Physical Exam  Constitutional: He is oriented to person, place, and time. He appears well-developed and well-nourished. No distress.  HENT:  Head: Normocephalic and atraumatic.  Neck: No thyromegaly present.       Normal carotid pulses  Cardiovascular: Normal rate, regular rhythm and normal heart sounds.   No murmur heard. Pulmonary/Chest: Effort normal and breath sounds normal. No respiratory distress. He has no wheezes. He has no rales.  Abdominal: Soft. Bowel sounds are normal. He exhibits no distension. There is no  tenderness. There is no rebound and no guarding.  Genitourinary: Rectum normal and prostate normal.  Musculoskeletal: He exhibits no edema.       Inspection and palpation of the wrists, hands and forearms normal  Neurological: He is alert and oriented to person, place, and time.  Skin: He is not diaphoretic.          Assessment & Plan:

## 2010-08-31 NOTE — Assessment & Plan Note (Addendum)
Status post neurology evaluation, felt to be traumatic; issue self-resolved

## 2010-08-31 NOTE — Assessment & Plan Note (Addendum)
Td 2010 Zostavax ~ 2010 Cscope w/ Dr Loreta [facility])  ~ 2008, had polyps, redundant colon , next ? 2013 ?----rec pt to call them  Getting more active, getting ready for a 5K run Diet remains his main challenge, several suggestions provided: Nutrition referral, Weight Watchers, calorie counting, etc.

## 2010-11-12 ENCOUNTER — Telehealth: Payer: Self-pay | Admitting: Internal Medicine

## 2010-11-12 MED ORDER — NAPROXEN 500 MG PO TABS: 500.0000 mg | ORAL_TABLET | Freq: Every day | ORAL | Status: DC

## 2010-11-12 MED ORDER — ATORVASTATIN CALCIUM 10 MG PO TABS: 10.0000 mg | ORAL_TABLET | Freq: Every day | ORAL | Status: DC

## 2010-11-12 NOTE — Telephone Encounter (Signed)
Done

## 2010-11-23 ENCOUNTER — Ambulatory Visit (INDEPENDENT_AMBULATORY_CARE_PROVIDER_SITE_OTHER): Payer: Self-pay

## 2010-11-23 DIAGNOSIS — Z23 Encounter for immunization: Secondary | ICD-10-CM

## 2011-02-14 ENCOUNTER — Telehealth: Payer: Self-pay | Admitting: Internal Medicine

## 2011-02-14 NOTE — Telephone Encounter (Signed)
Refill- naprosyn 500mg  tabs. Take one tablet by mouth daily. Qty 30 last fill 12.21.12

## 2011-02-14 NOTE — Telephone Encounter (Signed)
Refill-lipitor 10mg . Take one tablet by mouth daily. Qty 30 last fill 12.21.12

## 2011-02-15 MED ORDER — NAPROXEN 500 MG PO TABS: 500.0000 mg | ORAL_TABLET | Freq: Every day | ORAL | Status: DC

## 2011-02-15 MED ORDER — ATORVASTATIN CALCIUM 10 MG PO TABS: 10.0000 mg | ORAL_TABLET | Freq: Every day | ORAL | Status: DC

## 2011-02-15 NOTE — Telephone Encounter (Signed)
Refill done.  

## 2011-05-20 ENCOUNTER — Other Ambulatory Visit: Payer: Self-pay | Admitting: Internal Medicine

## 2011-05-20 MED ORDER — ATORVASTATIN CALCIUM 10 MG PO TABS: 10.0000 mg | ORAL_TABLET | Freq: Every day | ORAL | Status: DC

## 2011-05-20 MED ORDER — NAPROXEN 500 MG PO TABS: 500.0000 mg | ORAL_TABLET | Freq: Every day | ORAL | Status: DC

## 2011-05-20 NOTE — Telephone Encounter (Signed)
Refill done.  

## 2011-05-20 NOTE — Telephone Encounter (Signed)
refill x 2 for  Naprosyn EC 500MG  Tabs Qty 30 tabs  Take one tablet by mouth daily Last filled 3.28.13   &   Atorvastatin 10MG  Tablet Qty 30 tabs Take one tablet by mouth daily  Last filled 3.28.13  Last OV 08.10.12 Future f/u appt 5.24.13

## 2011-06-14 ENCOUNTER — Ambulatory Visit (INDEPENDENT_AMBULATORY_CARE_PROVIDER_SITE_OTHER): Payer: Self-pay | Admitting: Internal Medicine

## 2011-06-14 ENCOUNTER — Encounter: Payer: Self-pay | Admitting: Internal Medicine

## 2011-06-14 VITALS — BP 132/78 | HR 53 | Temp 97.8°F | Wt 263.0 lb

## 2011-06-14 DIAGNOSIS — I10 Essential (primary) hypertension: Secondary | ICD-10-CM

## 2011-06-14 DIAGNOSIS — E785 Hyperlipidemia, unspecified: Secondary | ICD-10-CM

## 2011-06-14 LAB — AST: AST: 38 U/L — ABNORMAL HIGH (ref 0–37)

## 2011-06-14 LAB — POTASSIUM: Potassium: 4.2 mEq/L (ref 3.5–5.1)

## 2011-06-14 NOTE — Progress Notes (Signed)
  Subjective:    Patient ID: Dale [name], male    DOB: August 31, 1950, 61 y.o.   MRN: [id number]  HPI 6 months followup. In general doing well. Reports he has been unable to exercise much, he has some stress and is not eating healthy. Gain a couple pounds since her last visit   Past Medical History  Diagnosis Date  . Hypertension   . Hyperlipidemia     mixed  . Obesity   . Elevated liver function tests   . Decreased hearing     Left, saw audiologist in 2011 per pt   . OSA (obstructive sleep apnea)     on CPAP     Review of Systems Good medication compliance, ambulatory BPs normal. Good compliance with Lipitor, denies any leg cramps or myalgias. Was seen with tendinitis, that resolved. Still uses naproxen when necessary. Good compliance with CPAP, energy level is normal.     Objective:   Physical Exam  General -- alert, well-developed, and overweight appearing. No apparent distress.  Lungs -- normal respiratory effort, no intercostal retractions, no accessory muscle use, and normal breath sounds.   Heart-- normal rate, regular rhythm, no murmur, and no gallop.   Extremities-- no pretibial edema bilaterally  psych-- Cognition and judgment appear intact. Alert and cooperative with normal attention span and concentration.  not anxious appearing and not depressed appearing.       Assessment & Plan:

## 2011-06-14 NOTE — Assessment & Plan Note (Signed)
Recheck a potassium level

## 2011-06-14 NOTE — Assessment & Plan Note (Addendum)
Good compliance with medications, check LFTs We discussed diet, [employer]? Also exercise at least 3 hours a week.

## 2011-06-18 ENCOUNTER — Encounter: Payer: Self-pay | Admitting: *Deleted

## 2011-07-22 ENCOUNTER — Ambulatory Visit (INDEPENDENT_AMBULATORY_CARE_PROVIDER_SITE_OTHER): Payer: Self-pay | Admitting: Family Medicine

## 2011-07-22 ENCOUNTER — Encounter: Payer: Self-pay | Admitting: Family Medicine

## 2011-07-22 ENCOUNTER — Telehealth: Payer: Self-pay | Admitting: Internal Medicine

## 2011-07-22 VITALS — BP 134/80 | HR 66 | Temp 98.6°F | Ht 70.25 in | Wt 263.2 lb

## 2011-07-22 DIAGNOSIS — H66019 Acute suppurative otitis media with spontaneous rupture of ear drum, unspecified ear: Secondary | ICD-10-CM

## 2011-07-22 MED ORDER — NEOMYCIN-COLIST-HC-THONZONIUM 3.3-3-10-0.5 MG/ML OT SUSP: 3.0000 [drp] | Freq: Three times a day (TID) | OTIC | Status: AC

## 2011-07-22 MED ORDER — AMOXICILLIN 875 MG PO TABS: 875.0000 mg | ORAL_TABLET | Freq: Two times a day (BID) | ORAL | Status: AC

## 2011-07-22 NOTE — Progress Notes (Signed)
  Subjective:    Patient ID: Dale [name], male    DOB: May 18, 1950, 61 y.o.   MRN: [id number]  HPI L ear pain- early last week had feeling of 'fluid in the ear'.  By late week feeling was 'significant', developed 'shooting pain', some dizziness, was unable to close back teeth normally.  Started Amox twice daily on Friday.  Started having 'brown liquid drainage' w/ only mild improvement in ear pressure.  No fevers.  Marked difference in hearing loss but this has improved since drainage started.  Now able to close jaw normally.   Review of Systems For ROS see HPI     Objective:   Physical Exam  Vitals reviewed. Constitutional: He appears well-developed and well-nourished. No distress.  HENT:  Head: Normocephalic and atraumatic.  Nose: Nose normal.  Mouth/Throat: Oropharynx is clear and moist. No oropharyngeal exudate.       R TM normal L TM obscured by copious ear drainage No TTP over L mastoid or w/ manipulation of pinna          Assessment & Plan:

## 2011-07-22 NOTE — Telephone Encounter (Signed)
Caller: Birdena Jubilee; PCP: Willow Ora; CB#: [phone]; ; ; Call regarding Ear Pain-Left;  Wife calling regarding left ear pain and brown drainage. Asking for same day appt. Onset 6/27. 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 Pain is a bit better this week but still having drainage and can not put back teeth together. Afebrile. Pt at work-not with caller. Appt made per wifes request at 1600pm with Dr. Chester Holstein appt's available with Dr. Drue Novel.

## 2011-07-22 NOTE — Patient Instructions (Addendum)
Start the Amoxicillin twice daily- take w/ food Use the Cortisporin ear drops as directed Tylenol/Ibuprofen as needed for pain Call with any questions or concerns Hang in there!!!

## 2011-07-22 NOTE — Assessment & Plan Note (Signed)
New.  Continue Amox as pt has already started this.  Start ear drops due to suspected perforation.  Reviewed supportive care and red flags that should prompt return.  Pt expressed understanding and is in agreement w/ plan.

## 2011-08-29 ENCOUNTER — Encounter: Payer: Self-pay | Admitting: *Deleted

## 2011-08-30 ENCOUNTER — Ambulatory Visit (INDEPENDENT_AMBULATORY_CARE_PROVIDER_SITE_OTHER): Payer: Self-pay | Admitting: Cardiology

## 2011-08-30 ENCOUNTER — Encounter: Payer: Self-pay | Admitting: Cardiology

## 2011-08-30 VITALS — BP 132/74 | HR 57 | Ht 70.25 in | Wt 264.0 lb

## 2011-08-30 DIAGNOSIS — I10 Essential (primary) hypertension: Secondary | ICD-10-CM

## 2011-08-30 DIAGNOSIS — E785 Hyperlipidemia, unspecified: Secondary | ICD-10-CM

## 2011-08-30 DIAGNOSIS — E669 Obesity, unspecified: Secondary | ICD-10-CM

## 2011-08-30 DIAGNOSIS — G4733 Obstructive sleep apnea (adult) (pediatric): Secondary | ICD-10-CM

## 2011-08-30 NOTE — Assessment & Plan Note (Signed)
Under good control. I've encouraged him to exercise and try to lose weight. I'll see him back when necessary. Further care by primary care.

## 2011-08-30 NOTE — Progress Notes (Signed)
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  He offers no complaints of angina or chest pain. Is not exercising and has not lost any weight. He is compliant with his meds. He is followed through primary care with Dr. Drue Novel.  Has orthopnea, PND, or edema.  Past Medical History  Diagnosis Date  . Hypertension   . Hyperlipidemia     mixed  . Obesity   . Elevated liver function tests   . Decreased hearing     Left, saw audiologist in 2011 per pt   . OSA (obstructive sleep apnea)     on CPAP    Current Outpatient Prescriptions  Medication Sig Dispense Refill  . aspirin 81 MG tablet Take 81 mg by mouth daily.        Marland Kitchen atorvastatin (LIPITOR) 10 MG tablet Take 1 tablet (10 mg total) by mouth daily.  30 tablet  3  . Cholecalciferol (VITAMIN D) 1000 UNITS capsule Take 1,000 Units by mouth daily.        . fish oil-omega-3 fatty acids 1000 MG capsule Take 1 g by mouth daily.       Marland Kitchen KRILL OIL PO Take 2 tablets by mouth daily.      . naproxen (NAPROSYN) 500 MG tablet Take 1 tablet (500 mg total) by mouth daily.  30 tablet  3    No Known Allergies  Family History  Problem Relation Age of Onset  . Prostate cancer Father 23    age 68s died at 7  . Colon cancer Neg Hx   . Coronary artery disease Paternal Grandmother   . Diabetes Mother   . Hypertension Mother   . Stroke Neg Hx   . Prostate cancer  48    uncle  . Heart failure Maternal Grandmother     History   Social History  . Marital Status: Married    Spouse Name: N/A    Number of Children: 3  . Years of Education: N/A   Occupational History  . dentist    Social History Main Topics  . Smoking status: Former Games developer  . Smokeless tobacco: Not on file  . Alcohol Use: Yes     socially  . Drug Use: No  . Sexually Active: Not on file   Other Topics Concern  . Not on file   Social History Narrative  . No narrative on file    ROS ALL NEGATIVE EXCEPT THOSE NOTED IN  HPI  PE  General Appearance: well developed, well nourished in no acute distress, obese HEENT: symmetrical face, PERRLA, good dentition  Neck: no JVD, thyromegaly, or adenopathy, trachea midline Chest: symmetric without deformity Cardiac: PMI non-displaced, RRR, normal S1, S2, no gallop or murmur Lung: clear to ausculation and percussion Vascular: all pulses full without bruits  Abdominal: nondistended, nontender, good bowel sounds, no HSM, no bruits Extremities: no cyanosis, clubbing or edema, no sign of DVT, no varicosities  Skin: normal color, no rashes Neuro: alert and oriented x 3, non-focal Pysch: normal affect  EKG Sinus bradycardia, otherwise normal EKG BMET    Component Value Date/Time   NA 144 08/31/2010 0904   K 4.2 06/14/2011 0842   CL 110 08/31/2010 0904   CO2 29 08/31/2010 0904   GLUCOSE 101* 08/31/2010 0904   BUN 17 08/31/2010 0904   CREATININE 1.0 08/31/2010 0904   CALCIUM 9.4 08/31/2010 0904   GFRNONAA 80.84 12/09/2008 1002   GFRAA 111 02/20/2007 0956  Lipid Panel     Component Value Date/Time   CHOL 145 08/31/2010 0904   TRIG 107.0 08/31/2010 0904   HDL 37.90* 08/31/2010 0904   CHOLHDL 4 08/31/2010 0904   VLDL 21.4 08/31/2010 0904   LDLCALC 86 08/31/2010 0904    CBC No results found for this basename: wbc, rbc, hgb, hct, plt, mcv, mch, mchc, rdw, neutrabs, lymphsabs, monoabs, eosabs, basosabs

## 2011-08-30 NOTE — Patient Instructions (Addendum)
Your physician recommends that you continue on your current medications as directed. Please refer to the Current Medication list given to you today.  Your physician recommends that you schedule a follow-up appointment in: 1year with Dr. Wall.  

## 2011-09-02 NOTE — Addendum Note (Signed)
Addended by: Micki Riley C on: 09/02/2011 03:55 PM   Modules accepted: Orders

## 2011-09-12 ENCOUNTER — Other Ambulatory Visit: Payer: Self-pay | Admitting: Internal Medicine

## 2011-09-12 MED ORDER — NAPROXEN 500 MG PO TABS: 500.0000 mg | ORAL_TABLET | Freq: Every day | ORAL | Status: DC

## 2011-09-12 NOTE — Telephone Encounter (Signed)
Naprosyn- EC- 500mg   Qty:30 Last refilled: 08/23/11 Take one tablet once a day by mouth

## 2011-09-12 NOTE — Telephone Encounter (Signed)
Refill done.  

## 2011-09-16 ENCOUNTER — Telehealth: Payer: Self-pay | Admitting: Internal Medicine

## 2011-09-16 MED ORDER — ATORVASTATIN CALCIUM 10 MG PO TABS: 10.0000 mg | ORAL_TABLET | Freq: Every day | ORAL | Status: DC

## 2011-09-16 NOTE — Telephone Encounter (Signed)
Refill done.  

## 2011-09-16 NOTE — Telephone Encounter (Signed)
Refill: Atorvastatin 10mg  tablet. Take one tablet once a day by mouth. Qty 30. Last fill 08-23-11

## 2011-11-08 ENCOUNTER — Ambulatory Visit: Payer: Self-pay | Admitting: *Deleted

## 2011-11-22 ENCOUNTER — Encounter: Payer: Self-pay | Admitting: Internal Medicine

## 2011-11-22 ENCOUNTER — Ambulatory Visit (INDEPENDENT_AMBULATORY_CARE_PROVIDER_SITE_OTHER): Payer: Self-pay | Admitting: Internal Medicine

## 2011-11-22 VITALS — BP 114/66 | HR 59 | Temp 98.3°F | Wt 265.0 lb

## 2011-11-22 DIAGNOSIS — E785 Hyperlipidemia, unspecified: Secondary | ICD-10-CM

## 2011-11-22 DIAGNOSIS — Z0000 Encounter for general adult medical examination without abnormal findings: Secondary | ICD-10-CM

## 2011-11-22 DIAGNOSIS — I10 Essential (primary) hypertension: Secondary | ICD-10-CM

## 2011-11-22 LAB — COMPREHENSIVE METABOLIC PANEL
ALT: 59 U/L — ABNORMAL HIGH (ref 0–53)
AST: 44 U/L — ABNORMAL HIGH (ref 0–37)
Albumin: 4.2 g/dL (ref 3.5–5.2)
Alkaline Phosphatase: 44 U/L (ref 39–117)
Potassium: 4.3 mEq/L (ref 3.5–5.1)
Sodium: 139 mEq/L (ref 135–145)
Total Protein: 7 g/dL (ref 6.0–8.3)

## 2011-11-22 LAB — LIPID PANEL
LDL Cholesterol: 86 mg/dL (ref 0–99)
Total CHOL/HDL Ratio: 4
Triglycerides: 132 mg/dL (ref 0.0–149.0)

## 2011-11-22 MED ORDER — NAPROXEN 500 MG PO TABS: 500.0000 mg | ORAL_TABLET | Freq: Every day | ORAL | Status: DC

## 2011-11-22 MED ORDER — ATORVASTATIN CALCIUM 10 MG PO TABS: 10.0000 mg | ORAL_TABLET | Freq: Every day | ORAL | Status: DC

## 2011-11-22 NOTE — Assessment & Plan Note (Signed)
On medications, normal ambulatory BPs.

## 2011-11-22 NOTE — Progress Notes (Signed)
  Subjective:    Patient ID: Dale [name], male    DOB: 10/22/1950, 61 y.o.   MRN: [id number]  HPI Routine visit Good medication compliance, no apparent side effects from the Lipitor   Past Medical History  Diagnosis Date  . Hypertension   . Hyperlipidemia     mixed  . Obesity   . Elevated liver function tests   . Decreased hearing     Left, saw audiologist in 2011 per pt   . OSA (obstructive sleep apnea)     on CPAP   Past Surgical History  Procedure Date  . Tonsillectomy    History   Social History  . Marital Status: Married    Spouse Name: N/A    Number of Children: 3  . Years of Education: N/A   Occupational History  . dentist    Social History Main Topics  . Smoking status: Former Smoker    Quit date: 11/21/1968  . Smokeless tobacco: Never Used   Comment: quit late 70s  . Alcohol Use: Yes     socially  . Drug Use: No  . Sexually Active: Not on file   Other Topics Concern  . Not on file   Social History Narrative  . No narrative on file    Review of Systems History of hypertension, on no medications. Normal ambulatory BPs Diet and exercise not improved since last visit mostly due to to lack of time    Objective:   Physical Exam General -- alert, well-developed, and overweight appearing. No apparent distress.  Lungs -- normal respiratory effort, no intercostal retractions, no accessory muscle use, and normal breath sounds.   Heart-- normal rate, regular rhythm, no murmur, and no gallop.    Psych-- Cognition and judgment appear intact. Alert and cooperative with normal attention span and concentration.  not anxious appearing and not depressed appearing.       Assessment & Plan:

## 2011-11-22 NOTE — Patient Instructions (Addendum)
Next visit in 6 to 8 months for a physical Naproxen --- always take it with food. Watch for stomach side effects (gastritis): nausea, stomach pain, change in the color of stools.

## 2011-11-22 NOTE — Assessment & Plan Note (Signed)
Good compliance with medication, check FLP and CMP

## 2011-11-22 NOTE — Assessment & Plan Note (Addendum)
Got a Flu shot already Reports he knows he is due for a colonoscopy, will schedule at his convenience

## 2011-11-26 ENCOUNTER — Encounter: Payer: Self-pay | Admitting: Internal Medicine

## 2012-03-07 ENCOUNTER — Other Ambulatory Visit: Payer: Self-pay

## 2012-06-24 ENCOUNTER — Other Ambulatory Visit: Payer: Self-pay | Admitting: Internal Medicine

## 2012-06-24 NOTE — Telephone Encounter (Signed)
Refill done.  

## 2012-07-31 ENCOUNTER — Ambulatory Visit (INDEPENDENT_AMBULATORY_CARE_PROVIDER_SITE_OTHER): Payer: Self-pay | Admitting: Internal Medicine

## 2012-07-31 ENCOUNTER — Encounter: Payer: Self-pay | Admitting: Internal Medicine

## 2012-07-31 VITALS — BP 128/75 | HR 63 | Temp 97.7°F | Ht 71.25 in | Wt 269.0 lb

## 2012-07-31 DIAGNOSIS — Z0000 Encounter for general adult medical examination without abnormal findings: Secondary | ICD-10-CM

## 2012-07-31 DIAGNOSIS — M549 Dorsalgia, unspecified: Secondary | ICD-10-CM

## 2012-07-31 LAB — COMPREHENSIVE METABOLIC PANEL
Albumin: 4.4 g/dL (ref 3.5–5.2)
BUN: 15 mg/dL (ref 6–23)
CO2: 28 mEq/L (ref 19–32)
Calcium: 9 mg/dL (ref 8.4–10.5)
GFR: 78.08 mL/min (ref >60.00)
Glucose, Bld: 84 mg/dL (ref 70–99)
Potassium: 5.1 mEq/L (ref 3.5–5.1)
Sodium: 141 mEq/L (ref 135–145)
Total Protein: 6.8 g/dL (ref 6.0–8.3)

## 2012-07-31 LAB — LIPID PANEL
Cholesterol: 153 mg/dL (ref 0–200)
HDL: 38.3 mg/dL — ABNORMAL LOW (ref >39.00)
LDL Cholesterol: 86 mg/dL (ref 0–99)
Total CHOL/HDL Ratio: 4
Triglycerides: 143 mg/dL (ref 0.0–149.0)
VLDL: 28.6 mg/dL (ref 0.0–40.0)

## 2012-07-31 LAB — CBC WITH DIFFERENTIAL/PLATELET
Basophils Relative: 0.7 % (ref 0.0–3.0)
Eosinophils Relative: 4.4 % (ref 0.0–5.0)
Lymphocytes Relative: 29.7 % (ref 12.0–46.0)
Monocytes Absolute: 0.4 10*3/uL (ref 0.1–1.0)
Monocytes Relative: 6.8 % (ref 3.0–12.0)
Neutrophils Relative %: 58.4 % (ref 43.0–77.0)
Platelets: 144 10*3/uL — ABNORMAL LOW (ref 150.0–400.0)
RBC: 4.93 Mil/uL (ref 4.22–5.81)
WBC: 6.1 10*3/uL (ref 4.5–10.5)

## 2012-07-31 LAB — TSH: TSH: 2.12 u[IU]/mL (ref 0.35–5.50)

## 2012-07-31 NOTE — Assessment & Plan Note (Signed)
On chronic naproxen, no apparent s/e

## 2012-07-31 NOTE — Assessment & Plan Note (Addendum)
Td 2010 Zostavax ~ 2010 Cscope w/ Dr Loreta [address] [facility])  ~ 2008, had polyps, redundant colon , next Cscope is due (cost is an issue), hemocult (-) today,IFOB provided  Self d/c ASA b/c does not like to take too many meds and worried about long term issues w/ ASA ( no FH CAD, no DM) Diet- exercise:  remains his main challenge, several suggestions provided  Labs  RTC 6 months

## 2012-07-31 NOTE — Progress Notes (Signed)
  Subjective:    Patient ID: Dale [name], male    DOB: 1950-02-16, 62 y.o.   MRN: [id number]  HPI  CPX  Past Medical History  Diagnosis Date  . Hypertension   . Hyperlipidemia     mixed  . Obesity   . Elevated liver function tests   . Decreased hearing     Left, saw audiologist in 2011 per pt   . OSA (obstructive sleep apnea)     on CPAP  . DJD (degenerative joint disease)    Past Surgical History  Procedure Laterality Date  . Tonsillectomy     History   Social History  . Marital Status: Married    Spouse Name: N/A    Number of Children: 3  . Years of Education: N/A   Occupational History  . dentist    Social History Main Topics  . Smoking status: Former Smoker    Quit date: 11/21/1968  . Smokeless tobacco: Never Used     Comment: quit late 70s  . Alcohol Use: Yes     Comment: socially  . Drug Use: No  . Sexually Active: Not on file   Other Topics Concern  . Not on file   Social History Narrative  . No narrative on file   Family History  Problem Relation Age of Onset  . Prostate cancer Father 48    age 66s died at 92  . Colon cancer Neg Hx   . Coronary artery disease Paternal Grandmother   . Diabetes Mother   . Hypertension Mother   . Stroke Neg Hx   . Prostate cancer  38    uncle  . Heart failure Maternal Grandmother      Review of Systems Diet exercise--definitely needs improvement. No chest pain or shortness or breath Denies nausea, vomiting, diarrhea or blood in the stools. No dysuria or gross hematuria. Emotionally doing well. Occasional leg cramps, Lipitor, would like to check a CK.     Objective:   Physical Exam BP 128/75  Pulse 63  Temp(Src) 97.7 F (36.5 C) (Oral)  Ht 5' 11.25" (1.81 m)  Wt 269 lb (122.018 kg)  BMI 37.24 kg/m2  SpO2 96%  General -- alert, well-developed,NAD  Neck --no thyromegaly , normal carotid pulse Lungs -- normal respiratory effort, no intercostal retractions, no accessory muscle use, and  normal breath sounds.   Heart-- normal rate, regular rhythm, no murmur, and no gallop.   Abdomen--soft, non-tender, no distention, no masses, no HSM, no guarding, and no rigidity.   Extremities-- no pretibial edema bilaterally Rectal-- No external abnormalities noted. Normal sphincter tone. No rectal masses or tenderness. Brown stool, Hemoccult negative Prostate:  Prostate gland firm and smooth, no enlargement, nodularity, tenderness, mass, asymmetry or induration. Neurologic-- alert & oriented X3 and strength normal in all extremities. Psych-- Cognition and judgment appear intact. Alert and cooperative with normal attention span and concentration.  not anxious appearing and not depressed appearing.       Assessment & Plan:

## 2012-08-01 ENCOUNTER — Encounter: Payer: Self-pay | Admitting: Internal Medicine

## 2012-08-10 ENCOUNTER — Encounter: Payer: Self-pay | Admitting: Internal Medicine

## 2012-08-11 NOTE — Telephone Encounter (Signed)
FYI

## 2012-08-13 ENCOUNTER — Telehealth: Payer: Self-pay | Admitting: *Deleted

## 2012-08-13 DIAGNOSIS — E785 Hyperlipidemia, unspecified: Secondary | ICD-10-CM

## 2012-08-13 NOTE — Telephone Encounter (Signed)
Message copied by Shirlee More I on Thu Aug 13, 2012  2:32 PM ------      Message from: Dale [name]      Created: Wed Aug 12, 2012  8:58 AM       Please enter a future order to be one month from now      Total CK ---dx  hyperlipidemia ------

## 2012-08-13 NOTE — Telephone Encounter (Signed)
Orders entered. Need to schedule labs only for one month from now.

## 2012-08-14 NOTE — Telephone Encounter (Signed)
Lab apt scheduled for 09/18/12.

## 2012-08-24 ENCOUNTER — Telehealth: Payer: Self-pay | Admitting: Internal Medicine

## 2012-08-25 NOTE — Telephone Encounter (Signed)
done

## 2012-08-25 NOTE — Telephone Encounter (Signed)
Ok to refill? Last OV 11.1.13 Last filled 6.4.14 #30 with 1 refill

## 2012-09-18 ENCOUNTER — Other Ambulatory Visit: Payer: Self-pay

## 2012-09-18 DIAGNOSIS — E785 Hyperlipidemia, unspecified: Secondary | ICD-10-CM

## 2012-09-19 ENCOUNTER — Telehealth: Payer: Self-pay | Admitting: Internal Medicine

## 2012-09-19 LAB — CK TOTAL AND CKMB (NOT AT ARMC): Relative Index: 2.5 (ref 0.0–2.5)

## 2012-09-19 NOTE — Telephone Encounter (Signed)
Dale [name] from [facility] [phone]) calling with Alert Lab Result done on blood drawn yesterday 09/18/2012 at 9:17 am.  Has Alert Lab CKMB High at 11.6 CK total 468 High with relative incex 2.5. Report called to [facility] office.

## 2012-09-19 NOTE — Telephone Encounter (Signed)
Received abnormal lab result - CK 400s, CKMB 11.  Not indicative of cardiac dysfunction in setting of elevated CK.  Reviewing chart, CK is being monitored for h/o HLD.  Will route to Dr. Drue Novel.

## 2012-10-22 ENCOUNTER — Ambulatory Visit (INDEPENDENT_AMBULATORY_CARE_PROVIDER_SITE_OTHER): Payer: Self-pay

## 2012-10-22 DIAGNOSIS — Z23 Encounter for immunization: Secondary | ICD-10-CM

## 2012-11-27 ENCOUNTER — Ambulatory Visit (INDEPENDENT_AMBULATORY_CARE_PROVIDER_SITE_OTHER): Payer: Self-pay | Admitting: Cardiology

## 2012-11-27 ENCOUNTER — Encounter: Payer: Self-pay | Admitting: Cardiology

## 2012-11-27 VITALS — BP 140/60 | HR 66 | Ht 71.5 in | Wt 273.0 lb

## 2012-11-27 DIAGNOSIS — I10 Essential (primary) hypertension: Secondary | ICD-10-CM

## 2012-11-27 DIAGNOSIS — E785 Hyperlipidemia, unspecified: Secondary | ICD-10-CM

## 2012-11-27 NOTE — Assessment & Plan Note (Signed)
Continue statin. Discussed importance of diet.

## 2012-11-27 NOTE — Assessment & Plan Note (Signed)
Blood pressure is controlled on no medications. Continue lifestyle modification. We discussed exercise.

## 2012-11-27 NOTE — Patient Instructions (Signed)
Your physician wants you to follow-up in: ONE YEAR WITH DR CRENSHAW You will receive a reminder letter in the mail two months in advance. If you don't receive a letter, please call our office to schedule the follow-up appointment.  

## 2012-11-27 NOTE — Progress Notes (Signed)
      HPI: 62 year old male previously followed by Dr. Daleen Squibb for followup of hypertension. Patient denies dyspnea on exertion, orthopnea, PND, pedal edema, chest pain or syncope.  Current Outpatient Prescriptions  Medication Sig Dispense Refill  . amoxicillin (AMOXIL) 500 MG capsule Take 500 mg by mouth 3 (three) times daily.      Marland Kitchen aspirin 81 MG tablet Take 81 mg by mouth daily.        Marland Kitchen atorvastatin (LIPITOR) 10 MG tablet Take 1 tablet (10 mg total) by mouth daily.  30 tablet  12  . Cholecalciferol (VITAMIN D) 1000 UNITS capsule Take 1,000 Units by mouth daily.        Marland Kitchen doxycycline (VIBRAMYCIN) 100 MG capsule Take 100 mg by mouth 2 (two) times daily.      Marland Kitchen EC-NAPROSYN 500 MG EC tablet TAKE ONE TABLET BY MOUTH DAILY.  30 tablet  6  . fish oil-omega-3 fatty acids 1000 MG capsule Take 1 g by mouth daily.       Marland Kitchen ibuprofen (ADVIL,MOTRIN) 800 MG tablet Take 800 mg by mouth every 8 (eight) hours as needed.      . Multiple Vitamin (MULTIVITAMIN) tablet Take 1 tablet by mouth daily.       No current facility-administered medications for this visit.     Past Medical History  Diagnosis Date  . Hypertension   . Hyperlipidemia     mixed  . Obesity   . Elevated liver function tests   . Decreased hearing     Left, saw audiologist in 2011 per pt   . OSA (obstructive sleep apnea)     on CPAP  . DJD (degenerative joint disease)     Past Surgical History  Procedure Laterality Date  . Tonsillectomy      History   Social History  . Marital Status: Married    Spouse Name: N/A    Number of Children: 3  . Years of Education: N/A   Occupational History  . dentist    Social History Main Topics  . Smoking status: Former Smoker    Quit date: 11/21/1968  . Smokeless tobacco: Never Used     Comment: quit late 70s  . Alcohol Use: Yes     Comment: socially  . Drug Use: No  . Sexual Activity: Not on file   Other Topics Concern  . Not on file   Social History Narrative  . No  narrative on file    ROS: no fevers or chills, productive cough, hemoptysis, dysphasia, odynophagia, melena, hematochezia, dysuria, hematuria, rash, seizure activity, orthopnea, PND, pedal edema, claudication. Remaining systems are negative.  Physical Exam: Well-developed obese in no acute distress.  Skin is warm and dry.  HEENT is normal.  Neck is supple.  Chest is clear to auscultation with normal expansion.  Cardiovascular exam is regular rate and rhythm.  Abdominal exam nontender or distended. No masses palpated. Extremities show no edema. neuro grossly intact  ECG sinus rhythm at a rate of 66. No ST changes.

## 2013-02-05 ENCOUNTER — Ambulatory Visit (INDEPENDENT_AMBULATORY_CARE_PROVIDER_SITE_OTHER): Payer: Self-pay | Admitting: Internal Medicine

## 2013-02-05 ENCOUNTER — Encounter: Payer: Self-pay | Admitting: Internal Medicine

## 2013-02-05 VITALS — BP 138/73 | HR 69 | Temp 98.2°F | Wt 272.0 lb

## 2013-02-05 DIAGNOSIS — I10 Essential (primary) hypertension: Secondary | ICD-10-CM

## 2013-02-05 DIAGNOSIS — L719 Rosacea, unspecified: Secondary | ICD-10-CM

## 2013-02-05 DIAGNOSIS — H579 Unspecified disorder of eye and adnexa: Secondary | ICD-10-CM

## 2013-02-05 DIAGNOSIS — E785 Hyperlipidemia, unspecified: Secondary | ICD-10-CM

## 2013-02-05 DIAGNOSIS — C4491 Basal cell carcinoma of skin, unspecified: Secondary | ICD-10-CM

## 2013-02-05 DIAGNOSIS — L718 Other rosacea: Secondary | ICD-10-CM | POA: Insufficient documentation

## 2013-02-05 LAB — CBC WITH DIFFERENTIAL/PLATELET
Basophils Absolute: 0 10*3/uL (ref 0.0–0.1)
Basophils Relative: 0.7 % (ref 0.0–3.0)
EOS ABS: 0.2 10*3/uL (ref 0.0–0.7)
Eosinophils Relative: 3.7 % (ref 0.0–5.0)
HCT: 42.5 % (ref 39.0–52.0)
Hemoglobin: 14.3 g/dL (ref 13.0–17.0)
Lymphocytes Relative: 25.2 % (ref 12.0–46.0)
Lymphs Abs: 1.5 10*3/uL (ref 0.7–4.0)
MCHC: 33.7 g/dL (ref 30.0–36.0)
MCV: 86 fl (ref 78.0–100.0)
MONO ABS: 0.4 10*3/uL (ref 0.1–1.0)
Monocytes Relative: 6.9 % (ref 3.0–12.0)
NEUTROS PCT: 63.5 % (ref 43.0–77.0)
Neutro Abs: 3.7 10*3/uL (ref 1.4–7.7)
PLATELETS: 143 10*3/uL — AB (ref 150.0–400.0)
RBC: 4.94 Mil/uL (ref 4.22–5.81)
RDW: 13.5 % (ref 11.5–14.6)
WBC: 5.8 10*3/uL (ref 4.5–10.5)

## 2013-02-05 LAB — AST: AST: 44 U/L — ABNORMAL HIGH (ref 0–37)

## 2013-02-05 LAB — ALT: ALT: 64 U/L — ABNORMAL HIGH (ref 0–53)

## 2013-02-05 NOTE — Assessment & Plan Note (Addendum)
Recheck LFTs, cholesterol well-controlled, good compliance with medication. Has chronic only mildly elevated CKs but no myalgias per se.

## 2013-02-05 NOTE — Assessment & Plan Note (Signed)
Diagnosed by Dr. Allyson Sabal, was prescribed doxycycline, redness is slightly decreased. Recommend to see ophthalmology

## 2013-02-05 NOTE — Progress Notes (Signed)
Pre visit review using our clinic review tool, if applicable. No additional management support is needed unless otherwise documented below in the visit note. 

## 2013-02-05 NOTE — Assessment & Plan Note (Signed)
Followup by Dr. Allyson Sabal

## 2013-02-05 NOTE — Progress Notes (Signed)
   Subjective:    Patient ID: Dale [name], male    DOB: November 03, 1950, 63 y.o.   MRN: [id number]  HPI Routine office visit, we discussed the following Hypertension--on no medication, no ambulatory BPs High cholesterol, good compliance with medication. Last CK was elevated, a chronic issue, no particular myalgias at this time. He was diagnosed with BCC and ocular rosacea, was a started on doxycycline by Dr. Allyson Sabal rosacea  is  slightly better.  Past Medical History  Diagnosis Date  . Hypertension   . Hyperlipidemia     mixed  . Obesity   . Elevated liver function tests   . Decreased hearing     Left, saw audiologist in 2011 per pt   . OSA (obstructive sleep apnea)     on CPAP  . DJD (degenerative joint disease)   . BCC (basal cell carcinoma of skin)     dr Allyson Sabal  . Ocular rosacea     on doxy as of 01-2013   Past Surgical History  Procedure Laterality Date  . Tonsillectomy      Review of Systems No chest pain, difficulty breathing. Diet and exercise--not improved    Objective:   Physical Exam  BP 138/73  Pulse 69  Temp(Src) 98.2 F (36.8 C)  Wt 272 lb (123.378 kg)  SpO2 97% General -- alert, well-developed, NAD.  HEENT-- Conjunctiva slightly red, worse on the left Lungs -- normal respiratory effort, no intercostal retractions, no accessory muscle use, and normal breath sounds.  Heart-- normal rate, regular rhythm, no murmur.  Extremities-- no pretibial edema bilaterally  Psych-- Cognition and judgment appear intact. Cooperative with normal attention span and concentration. No anxious or depressed appearing.     Assessment & Plan:

## 2013-02-05 NOTE — Assessment & Plan Note (Addendum)
BP today is satisfactory, not checking ambulatory BPs. CBC with mild thrombocytopenia, we'll recheck a CBC

## 2013-02-05 NOTE — Patient Instructions (Signed)
Get your blood work before you leave   Next visit is for a physical exam in 6 months , fasting Please make an appointment     If you need more information about a healthy diet,    hypertension visit  the American Heart Association, it  is a Financial planner at:  [email or web address]

## 2013-02-06 ENCOUNTER — Telehealth: Payer: Self-pay | Admitting: Internal Medicine

## 2013-02-06 NOTE — Telephone Encounter (Signed)
Relevant patient education assigned to patient using Emmi. ° °

## 2013-02-09 ENCOUNTER — Encounter: Payer: Self-pay | Admitting: Internal Medicine

## 2013-03-08 ENCOUNTER — Other Ambulatory Visit: Payer: Self-pay | Admitting: Internal Medicine

## 2013-04-07 ENCOUNTER — Other Ambulatory Visit: Payer: Self-pay | Admitting: Internal Medicine

## 2013-05-13 ENCOUNTER — Other Ambulatory Visit: Payer: Self-pay | Admitting: Internal Medicine

## 2013-05-13 DIAGNOSIS — M549 Dorsalgia, unspecified: Secondary | ICD-10-CM

## 2013-05-13 NOTE — Telephone Encounter (Signed)
Requesting EC-Naprosyn 500mg -Take 1 tablet by  Mouth every day. Last refill:04-08-13;#30,0 Last OV:02-05-13 Please advise.//AB/CMA

## 2013-05-20 ENCOUNTER — Other Ambulatory Visit: Payer: Self-pay | Admitting: Internal Medicine

## 2013-05-26 NOTE — Telephone Encounter (Signed)
Refill for naprosyn sent to [facility]

## 2013-08-06 ENCOUNTER — Ambulatory Visit (INDEPENDENT_AMBULATORY_CARE_PROVIDER_SITE_OTHER): Payer: Self-pay | Admitting: Internal Medicine

## 2013-08-06 ENCOUNTER — Encounter: Payer: Self-pay | Admitting: Internal Medicine

## 2013-08-06 VITALS — BP 151/78 | HR 66 | Temp 98.0°F | Ht 71.0 in | Wt 269.0 lb

## 2013-08-06 DIAGNOSIS — I10 Essential (primary) hypertension: Secondary | ICD-10-CM

## 2013-08-06 DIAGNOSIS — L718 Other rosacea: Secondary | ICD-10-CM

## 2013-08-06 DIAGNOSIS — Z0000 Encounter for general adult medical examination without abnormal findings: Secondary | ICD-10-CM

## 2013-08-06 LAB — COMPREHENSIVE METABOLIC PANEL
ALK PHOS: 49 U/L (ref 39–117)
ALT: 70 U/L — ABNORMAL HIGH (ref 0–53)
AST: 46 U/L — AB (ref 0–37)
Albumin: 4.2 g/dL (ref 3.5–5.2)
BILIRUBIN TOTAL: 0.9 mg/dL (ref 0.2–1.2)
BUN: 11 mg/dL (ref 6–23)
CO2: 26 mEq/L (ref 19–32)
Calcium: 9.4 mg/dL (ref 8.4–10.5)
Chloride: 107 mEq/L (ref 96–112)
Creatinine, Ser: 1 mg/dL (ref 0.4–1.5)
GFR: 83.47 mL/min (ref >60.00)
Glucose, Bld: 83 mg/dL (ref 70–99)
POTASSIUM: 4.1 meq/L (ref 3.5–5.1)
Sodium: 141 mEq/L (ref 135–145)
Total Protein: 7.1 g/dL (ref 6.0–8.3)

## 2013-08-06 LAB — LIPID PANEL
Cholesterol: 148 mg/dL (ref 0–200)
HDL: 36.8 mg/dL — AB (ref >39.00)
LDL Cholesterol: 91 mg/dL (ref 0–99)
NONHDL: 111.2
Total CHOL/HDL Ratio: 4
Triglycerides: 101 mg/dL (ref 0.0–149.0)
VLDL: 20.2 mg/dL (ref 0.0–40.0)

## 2013-08-06 NOTE — Assessment & Plan Note (Signed)
BP slightly elevated, we agreed on self- monitoring, see instructions

## 2013-08-06 NOTE — Assessment & Plan Note (Addendum)
Td 2010 pnm shot --likes to wait Zostavax ~ 2010 Cscope w/ Dr Fredda Hammed [facility])  ~ 2008, had polyps, redundant colon , next Cscope is due (cost is an issue) ; pt will get medicare soon, plans to do a cscope then Prostate cancer screening: DRE last year normal, PSA is stable over time. Will screening next year Diet- exercise discussed  Labs  RTC 1 year Check amb BPs

## 2013-08-06 NOTE — Progress Notes (Signed)
Pre visit review using our clinic review tool, if applicable. No additional management support is needed unless otherwise documented below in the visit note. 

## 2013-08-06 NOTE — Progress Notes (Signed)
Subjective:    Patient ID: Dale [name], male    DOB: 02-25-1950, 63 y.o.   MRN: [id number]  DOS:  08/06/2013 Type of visit - description: CPX History: doing well   ROS Denies chest pain or difficulty breathing, no lower extremity edema No nausea, vomiting, diarrhea. No GERD symptoms Emotionally doing well, no anxiety  depression No dysuria, gross hematuria, urinary frequency. Occasionally has difficulty urinating if he "hold the urine for too long".   Past Medical History  Diagnosis Date  . Hypertension   . Hyperlipidemia     mixed  . Obesity   . Elevated liver function tests   . Decreased hearing     Left, saw audiologist in 2011 per pt   . OSA (obstructive sleep apnea)     on CPAP  . DJD (degenerative joint disease)   . BCC (basal cell carcinoma of skin)     dr Allyson Sabal  . Ocular rosacea     on doxy as of 01-2013    Past Surgical History  Procedure Laterality Date  . Tonsillectomy      History   Social History  . Marital Status: Married    Spouse Name: N/A    Number of Children: 3  . Years of Education: N/A   Occupational History  . dentist    Social History Main Topics  . Smoking status: Former Smoker    Quit date: 11/21/1968  . Smokeless tobacco: Never Used     Comment: quit late 70s  . Alcohol Use: Yes     Comment: socially  . Drug Use: No  . Sexual Activity: Not on file   Other Topics Concern  . Not on file   Social History Narrative   Household , one child at home      Family History  Problem Relation Age of Onset  . Prostate cancer Father 74    age 29s died at 27, also an UNCLE  . Colon cancer Neg Hx   . Coronary artery disease Paternal Grandmother   . Diabetes Mother   . Hypertension Mother   . Stroke Neg Hx   . Heart failure Maternal Grandmother        Medication List       This list is accurate as of: 08/06/13 11:59 PM.  Always use your most recent med list.               aspirin 81 MG tablet  Take 81 mg by  mouth daily.     atorvastatin 10 MG tablet  Commonly known as:  LIPITOR  TAKE ONE TABLET BY MOUTH DAILY.     fish oil-omega-3 fatty acids 1000 MG capsule  Take 1 g by mouth daily.     ibuprofen 800 MG tablet  Commonly known as:  ADVIL,MOTRIN  Take 800 mg by mouth every 8 (eight) hours as needed.     multivitamin tablet  Take 1 tablet by mouth daily.     Vitamin D 1000 UNITS capsule  Take 1,000 Units by mouth daily.           Objective:   Physical Exam BP 151/78  Pulse 66  Temp(Src) 98 F (36.7 C)  Ht 5\' 11"  (1.803 m)  Wt 269 lb (122.018 kg)  BMI 37.53 kg/m2  SpO2 98% General -- alert, well-developed, NAD.  Neck --no thyromegaly  HEENT-- Not pale.  Lungs -- normal respiratory effort, no intercostal retractions, no accessory muscle use, and normal breath sounds.  Heart-- normal rate, regular rhythm, no murmur.  Abdomen-- Not distended, good bowel sounds,soft, non-tender. Extremities-- no pretibial edema bilaterally  Neurologic--  alert & oriented X3. Speech normal, gait appropriate for age, strength symmetric and appropriate for age.  Psych-- Cognition and judgment appear intact. Cooperative with normal attention span and concentration. No anxious or depressed appearing.        Assessment & Plan:

## 2013-08-06 NOTE — Assessment & Plan Note (Signed)
Of antibiotics, was recommended warm compresses as needed

## 2013-08-06 NOTE — Patient Instructions (Signed)
Get your blood work before you leave    Check the  blood pressure  Weekly  be sure it is between 110/60 and 140/85. Ideal blood pressure is 120/80. If it is consistently higher or lower, let me know  Next visit is for a physical exam in 1 year, fasting Please make an appointment

## 2013-10-19 ENCOUNTER — Other Ambulatory Visit: Payer: Self-pay | Admitting: Internal Medicine

## 2014-01-07 ENCOUNTER — Ambulatory Visit (INDEPENDENT_AMBULATORY_CARE_PROVIDER_SITE_OTHER): Payer: Self-pay

## 2014-01-07 DIAGNOSIS — Z23 Encounter for immunization: Secondary | ICD-10-CM

## 2014-01-07 NOTE — Progress Notes (Signed)
Pre visit review using our clinic review tool, if applicable. No additional management support is needed unless otherwise documented below in the visit note. 

## 2014-01-07 NOTE — Progress Notes (Signed)
Pt tolerated injection well.  No signs of a reaction upon leaving the clinic.   

## 2014-07-18 ENCOUNTER — Other Ambulatory Visit: Payer: Self-pay

## 2014-07-26 ENCOUNTER — Telehealth: Payer: Self-pay | Admitting: Internal Medicine

## 2014-07-26 NOTE — Telephone Encounter (Signed)
pre visit letter mailed 07/22/14

## 2014-08-11 ENCOUNTER — Encounter: Payer: Self-pay | Admitting: Behavioral Health

## 2014-08-11 ENCOUNTER — Telehealth: Payer: Self-pay | Admitting: Behavioral Health

## 2014-08-11 NOTE — Addendum Note (Signed)
Addended by: Eduard Roux E on: 08/11/2014 03:00 PM   Modules accepted: Medications

## 2014-08-11 NOTE — Telephone Encounter (Signed)
Unable to reach patient at time of Pre-Visit Call.  Left message for patient to return call when available.    

## 2014-08-11 NOTE — Telephone Encounter (Signed)
Pre-Visit Call completed with patient and chart updated.   Pre-Visit Info documented in Specialty Comments under [employer].    

## 2014-08-12 ENCOUNTER — Ambulatory Visit (INDEPENDENT_AMBULATORY_CARE_PROVIDER_SITE_OTHER): Payer: Medicare Other | Admitting: Internal Medicine

## 2014-08-12 ENCOUNTER — Encounter: Payer: Self-pay | Admitting: Internal Medicine

## 2014-08-12 VITALS — BP 128/76 | HR 78 | Temp 98.0°F | Ht 71.25 in | Wt 267.0 lb

## 2014-08-12 DIAGNOSIS — G4733 Obstructive sleep apnea (adult) (pediatric): Secondary | ICD-10-CM

## 2014-08-12 DIAGNOSIS — Z0000 Encounter for general adult medical examination without abnormal findings: Secondary | ICD-10-CM

## 2014-08-12 DIAGNOSIS — Z125 Encounter for screening for malignant neoplasm of prostate: Secondary | ICD-10-CM

## 2014-08-12 DIAGNOSIS — R945 Abnormal results of liver function studies: Secondary | ICD-10-CM

## 2014-08-12 DIAGNOSIS — L718 Other rosacea: Secondary | ICD-10-CM

## 2014-08-12 DIAGNOSIS — IMO0001 Reserved for inherently not codable concepts without codable children: Secondary | ICD-10-CM

## 2014-08-12 DIAGNOSIS — E785 Hyperlipidemia, unspecified: Secondary | ICD-10-CM

## 2014-08-12 DIAGNOSIS — R030 Elevated blood-pressure reading, without diagnosis of hypertension: Secondary | ICD-10-CM

## 2014-08-12 DIAGNOSIS — Z23 Encounter for immunization: Secondary | ICD-10-CM

## 2014-08-12 DIAGNOSIS — R7989 Other specified abnormal findings of blood chemistry: Secondary | ICD-10-CM

## 2014-08-12 LAB — CBC WITH DIFFERENTIAL/PLATELET
BASOS ABS: 0 10*3/uL (ref 0.0–0.1)
Basophils Relative: 0.5 % (ref 0.0–3.0)
EOS ABS: 0.2 10*3/uL (ref 0.0–0.7)
Eosinophils Relative: 3.7 % (ref 0.0–5.0)
HCT: 43.8 % (ref 39.0–52.0)
Hemoglobin: 14.7 g/dL (ref 13.0–17.0)
Lymphocytes Relative: 31.1 % (ref 12.0–46.0)
Lymphs Abs: 1.8 10*3/uL (ref 0.7–4.0)
MCHC: 33.7 g/dL (ref 30.0–36.0)
MCV: 86.5 fl (ref 78.0–100.0)
Monocytes Absolute: 0.4 10*3/uL (ref 0.1–1.0)
Monocytes Relative: 6.7 % (ref 3.0–12.0)
Neutro Abs: 3.4 10*3/uL (ref 1.4–7.7)
Neutrophils Relative %: 58 % (ref 43.0–77.0)
PLATELETS: 150 10*3/uL (ref 150.0–400.0)
RBC: 5.06 Mil/uL (ref 4.22–5.81)
RDW: 13.9 % (ref 11.5–15.5)
WBC: 5.9 10*3/uL (ref 4.0–10.5)

## 2014-08-12 LAB — BASIC METABOLIC PANEL
BUN: 17 mg/dL (ref 6–23)
CALCIUM: 9.1 mg/dL (ref 8.4–10.5)
CO2: 29 mEq/L (ref 19–32)
Chloride: 106 mEq/L (ref 96–112)
Creatinine, Ser: 1.02 mg/dL (ref 0.40–1.50)
GFR: 77.59 mL/min (ref >60.00)
Glucose, Bld: 86 mg/dL (ref 70–99)
POTASSIUM: 4.1 meq/L (ref 3.5–5.1)
Sodium: 141 mEq/L (ref 135–145)

## 2014-08-12 LAB — LIPID PANEL
CHOL/HDL RATIO: 4
CHOLESTEROL: 165 mg/dL (ref 0–200)
HDL: 40.5 mg/dL (ref >39.00)
LDL CALC: 92 mg/dL (ref 0–99)
NONHDL: 124.5
Triglycerides: 164 mg/dL — ABNORMAL HIGH (ref 0.0–149.0)
VLDL: 32.8 mg/dL (ref 0.0–40.0)

## 2014-08-12 LAB — ALT: ALT: 50 U/L (ref 0–53)

## 2014-08-12 LAB — AST: AST: 35 U/L (ref 0–37)

## 2014-08-12 LAB — PSA: PSA: 0.98 ng/mL (ref 0.10–4.00)

## 2014-08-12 MED ORDER — ATORVASTATIN CALCIUM 10 MG PO TABS: 10.0000 mg | ORAL_TABLET | Freq: Every day | ORAL | Status: DC

## 2014-08-12 NOTE — Assessment & Plan Note (Signed)
Reports LFTs have been elevated for many years,  blood work available is hepatitis serology, costo of further  workup is an issue, we'll reassess next year.

## 2014-08-12 NOTE — Assessment & Plan Note (Addendum)
Failed to improve with Doxy, ophthalmology recommended when necessary use of warm compresses

## 2014-08-12 NOTE — Assessment & Plan Note (Signed)
Reports he uses his CPAP consistently

## 2014-08-12 NOTE — Patient Instructions (Signed)
Get your blood work before you leave    

## 2014-08-12 NOTE — Assessment & Plan Note (Signed)
Td 2010 pnm shot --today Reports previous hepatitis B shots Zostavax ~ 2010 Cscope w/ Dr Fredda Hammed ([place])  ~ 2008, had polyps, redundant colon , next Cscope is due (cost is an issue) ; pt will get medicare then do the cscope, hopefully this year Prostate cancer screening : Father had prostate cancer at age 66, DRE normal today, check a PSA   Diet- exercise discussed  Labs  RTC 1 year Check amb BPs

## 2014-08-12 NOTE — Progress Notes (Signed)
Pre visit review using our clinic review tool, if applicable. No additional management support is needed unless otherwise documented below in the visit note. 

## 2014-08-12 NOTE — Progress Notes (Signed)
Subjective:    Patient ID: Dale [name], male    DOB: 1950-02-05, 64 y.o.   MRN: [id number]  DOS:  08/12/2014 Type of visit - description : CPX  In general feeling well. Has chronic problems with hearing but declined referral. Not very physically active.     Review of Systems Constitutional: No fever. No chills. No unexplained wt changes. No unusual sweats  HEENT: No dental problems, no ear discharge, no facial swelling, no voice changes. No eye discharge, no eye  redness , no  intolerance to light   Respiratory: No wheezing , no  difficulty breathing. No cough , no mucus production  Cardiovascular: No CP, no leg swelling , no  Palpitations  GI: no nausea, no vomiting, no diarrhea , no  abdominal pain.  No blood in the stools. No dysphagia, no odynophagia    Endocrine: No polyphagia, no polyuria , no polydipsia  GU: No dysuria, gross hematuria, difficulty urinating. No urinary urgency, no frequency.  Musculoskeletal: No joint swellings or unusual aches or pains  Skin: No change in the color of the skin, palor , no  Rash  Allergic, immunologic: No environmental allergies , no  food allergies  Neurological: No dizziness no  syncope. No headaches. No diplopia, no slurred, no slurred speech, no motor deficits, no facial  Numbness  Hematological: No enlarged lymph nodes, no easy bruising , no unusual bleedings  Psychiatry: No suicidal ideas, no hallucinations, no beavior problems, no confusion.  No unusual/severe anxiety, no depression   Past Medical History  Diagnosis Date  . Hypertension   . Hyperlipidemia     mixed  . Obesity   . Elevated liver function tests   . Decreased hearing     Left, saw audiologist in 2011 per pt   . OSA (obstructive sleep apnea)     on CPAP  . DJD (degenerative joint disease)   . BCC (basal cell carcinoma of skin)     dr Allyson Sabal  . Ocular rosacea     on doxy as of 01-2013    Past Surgical History  Procedure Laterality Date  .  Tonsillectomy      History   Social History  . Marital Status: Married    Spouse Name: N/A  . Number of Children: 3  . Years of Education: N/A   Occupational History  . dentist    Social History Main Topics  . Smoking status: Former Smoker    Quit date: 11/21/1968  . Smokeless tobacco: Never Used     Comment: quit late 70s  . Alcohol Use: Yes     Comment: socially  . Drug Use: No  . Sexual Activity: Not on file   Other Topics Concern  . Not on file   Social History Narrative   Household , one child at home      Family History  Problem Relation Age of Onset  . Prostate cancer Father 17    age 66s died at 33, also an UNCLE  . Colon cancer Neg Hx   . Coronary artery disease Paternal Grandmother   . Diabetes Mother   . Hypertension Mother   . Stroke Neg Hx   . Heart failure Maternal Grandmother        Medication List       This list is accurate as of: 08/12/14  8:29 PM.  Always use your most recent med list.  aspirin 81 MG tablet  Take 81 mg by mouth daily.     atorvastatin 10 MG tablet  Commonly known as:  LIPITOR  Take 1 tablet (10 mg total) by mouth daily.     fish oil-omega-3 fatty acids 1000 MG capsule  Take 1 g by mouth daily.     ibuprofen 800 MG tablet  Commonly known as:  ADVIL,MOTRIN  Take 800 mg by mouth every 8 (eight) hours as needed.     Vitamin D 1000 UNITS capsule  Take 1,000 Units by mouth daily.           Objective:   Physical Exam BP 128/76 mmHg  Pulse 78  Temp(Src) 98 F (36.7 C) (Oral)  Ht 5' 11.25" (1.81 m)  Wt 267 lb (121.11 kg)  BMI 36.97 kg/m2  SpO2 97%  General:   Well developed, well nourished . NAD.  Neck:  Full range of motion. Supple. No  thyromegaly , normal carotid pulse HEENT:  Normocephalic . Face symmetric, atraumatic Lungs:  CTA B Normal respiratory effort, no intercostal retractions, no accessory muscle use. Heart: RRR,  no murmur.  No pretibial edema bilaterally  Abdomen:    Not distended, soft, non-tender. No rebound or rigidity. No mass,organomegaly Rectal:  External abnormalities: none. Normal sphincter tone. No rectal masses or tenderness.  Stool brown  Prostate: Prostate gland firm and smooth, no enlargement, nodularity, tenderness, mass, asymmetry or induration.  Skin: Exposed areas without rash. Not pale. Not jaundice Neurologic:  alert & oriented X3.  Speech normal, gait appropriate for age and unassisted Strength symmetric and appropriate for age.  Psych: Cognition and judgment appear intact.  Cooperative with normal attention span and concentration.  Behavior appropriate. No anxious or depressed appearing.    Assessment & Plan:

## 2014-10-07 ENCOUNTER — Ambulatory Visit (INDEPENDENT_AMBULATORY_CARE_PROVIDER_SITE_OTHER): Payer: Self-pay

## 2014-10-07 DIAGNOSIS — Z23 Encounter for immunization: Secondary | ICD-10-CM

## 2014-10-07 NOTE — Progress Notes (Signed)
Pt tolerated injection well

## 2014-10-07 NOTE — Progress Notes (Signed)
Pre visit review using our clinic review tool, if applicable. No additional management support is needed unless otherwise documented below in the visit note. 

## 2015-01-22 DIAGNOSIS — D126 Benign neoplasm of colon, unspecified: Secondary | ICD-10-CM

## 2015-01-22 HISTORY — DX: Benign neoplasm of colon, unspecified: D12.6

## 2015-08-18 ENCOUNTER — Ambulatory Visit (HOSPITAL_BASED_OUTPATIENT_CLINIC_OR_DEPARTMENT_OTHER)
Admission: RE | Admit: 2015-08-18 | Discharge: 2015-08-18 | Disposition: A | Discharge status: Home / Self Care | Payer: Medicare Other | Source: Ambulatory Visit | Attending: Internal Medicine | Admitting: Internal Medicine

## 2015-08-18 ENCOUNTER — Encounter: Payer: Self-pay | Admitting: Internal Medicine

## 2015-08-18 ENCOUNTER — Ambulatory Visit (INDEPENDENT_AMBULATORY_CARE_PROVIDER_SITE_OTHER): Payer: Medicare Other | Admitting: Internal Medicine

## 2015-08-18 VITALS — BP 126/70 | HR 58 | Temp 98.4°F | Resp 14 | Ht 71.0 in | Wt 258.5 lb

## 2015-08-18 DIAGNOSIS — M8938 Hypertrophy of bone, other site: Secondary | ICD-10-CM | POA: Diagnosis not present

## 2015-08-18 DIAGNOSIS — E785 Hyperlipidemia, unspecified: Secondary | ICD-10-CM | POA: Diagnosis not present

## 2015-08-18 DIAGNOSIS — Z1211 Encounter for screening for malignant neoplasm of colon: Secondary | ICD-10-CM

## 2015-08-18 DIAGNOSIS — IMO0001 Reserved for inherently not codable concepts without codable children: Secondary | ICD-10-CM

## 2015-08-18 DIAGNOSIS — E669 Obesity, unspecified: Secondary | ICD-10-CM

## 2015-08-18 DIAGNOSIS — M47897 Other spondylosis, lumbosacral region: Secondary | ICD-10-CM | POA: Diagnosis not present

## 2015-08-18 DIAGNOSIS — M150 Primary generalized (osteo)arthritis: Secondary | ICD-10-CM | POA: Insufficient documentation

## 2015-08-18 DIAGNOSIS — Z23 Encounter for immunization: Secondary | ICD-10-CM

## 2015-08-18 DIAGNOSIS — Z0000 Encounter for general adult medical examination without abnormal findings: Secondary | ICD-10-CM

## 2015-08-18 DIAGNOSIS — M47816 Spondylosis without myelopathy or radiculopathy, lumbar region: Secondary | ICD-10-CM | POA: Diagnosis not present

## 2015-08-18 DIAGNOSIS — Z09 Encounter for follow-up examination after completed treatment for conditions other than malignant neoplasm: Secondary | ICD-10-CM | POA: Insufficient documentation

## 2015-08-18 DIAGNOSIS — R030 Elevated blood-pressure reading, without diagnosis of hypertension: Secondary | ICD-10-CM | POA: Diagnosis not present

## 2015-08-18 DIAGNOSIS — M25552 Pain in left hip: Secondary | ICD-10-CM | POA: Diagnosis not present

## 2015-08-18 DIAGNOSIS — M159 Polyosteoarthritis, unspecified: Secondary | ICD-10-CM

## 2015-08-18 LAB — LIPID PANEL
Cholesterol: 138 mg/dL (ref 0–200)
HDL: 34.4 mg/dL — AB (ref >39.00)
LDL Cholesterol: 74 mg/dL (ref 0–99)
NONHDL: 103.41
TRIGLYCERIDES: 146 mg/dL (ref 0.0–149.0)
Total CHOL/HDL Ratio: 4
VLDL: 29.2 mg/dL (ref 0.0–40.0)

## 2015-08-18 LAB — COMPREHENSIVE METABOLIC PANEL
ALK PHOS: 47 U/L (ref 39–117)
ALT: 48 U/L (ref 0–53)
AST: 36 U/L (ref 0–37)
Albumin: 4.5 g/dL (ref 3.5–5.2)
BUN: 11 mg/dL (ref 6–23)
CALCIUM: 9.4 mg/dL (ref 8.4–10.5)
CO2: 32 mEq/L (ref 19–32)
Chloride: 104 mEq/L (ref 96–112)
Creatinine, Ser: 0.99 mg/dL (ref 0.40–1.50)
GFR: 80.06 mL/min (ref >60.00)
GLUCOSE: 90 mg/dL (ref 70–99)
POTASSIUM: 4.2 meq/L (ref 3.5–5.1)
Sodium: 138 mEq/L (ref 135–145)
Total Bilirubin: 1.1 mg/dL (ref 0.2–1.2)
Total Protein: 7.2 g/dL (ref 6.0–8.3)

## 2015-08-18 LAB — TSH: TSH: 2.26 u[IU]/mL (ref 0.35–4.50)

## 2015-08-18 NOTE — Progress Notes (Signed)
Pre visit review using our clinic review tool, if applicable. No additional management support is needed unless otherwise documented below in the visit note. 

## 2015-08-18 NOTE — Progress Notes (Signed)
Subjective:    Patient ID: Dale [name], male    DOB: 1951/01/07, 65 y.o.   MRN: [id number]  DOS:  08/18/2015 Type of visit - description : ROV    Musculoskeletal:  Over the last year has developed a midline low back pain, occasional anterior hip pain without a bulging mass, often times both legs feel weak, also has sometimes paresthesias on the left on a L5 distribution. High cholesterol: Good compliance with medications, due for labs Obesity: Trying to look better, has not lost much weight. OSA: Good compliance with CPAP Ocular rosacea: Sees ophthalmology regularly.  Review of Systems  Constitutional: No fever. No chills. No unexplained wt changes. No unusual sweats  HEENT: No dental problems, no ear discharge, no facial swelling, no voice changes. No eye discharge, no eye  redness , no  intolerance to light   Respiratory: No wheezing , no  difficulty breathing. No cough , no mucus production  Cardiovascular: No CP, no leg swelling , no  Palpitations  GI: no nausea, no vomiting, no diarrhea , no  abdominal pain.  No blood in the stools. No dysphagia, no odynophagia    Endocrine: No polyphagia, no polyuria , no polydipsia  GU: No dysuria, gross hematuria, difficulty urinating. No urinary urgency, no frequency.   Skin: No change in the color of the skin, palor , no  Rash  Allergic, immunologic: No environmental allergies , no  food allergies  Neurological: No dizziness no  syncope. No headaches. No diplopia, no slurred, no slurred speech, no motor deficits, no facial  Numbness  Hematological: No enlarged lymph nodes, no easy bruising , no unusual bleedings  Psychiatry: No suicidal ideas, no hallucinations, no beavior problems, no confusion.  No unusual/severe anxiety, no depression  Past Medical History:  Diagnosis Date  . BCC (basal cell carcinoma of skin)    dr Allyson Sabal  . Decreased hearing    Left, saw audiologist in 2011 per pt   . DJD (degenerative joint  disease)   . Elevated liver function tests   . Hyperlipidemia    mixed  . Hypertension   . Obesity   . Ocular rosacea    on doxy as of 01-2013  . OSA (obstructive sleep apnea)    on CPAP    Past Surgical History:  Procedure Laterality Date  . TONSILLECTOMY      Social History   Social History  . Marital status: Married    Spouse name: N/A  . Number of children: 3  . Years of education: N/A   Occupational History  . dentist    Social History Main Topics  . Smoking status: Former Smoker    Quit date: 11/21/1968  . Smokeless tobacco: Never Used     Comment: quit late 70s  . Alcohol use Yes     Comment: socially  . Drug use: No  . Sexual activity: Not on file   Other Topics Concern  . Not on file   Social History Narrative   Household , one child at home      Family History  Problem Relation Age of Onset  . Prostate cancer Father 25    age 90s died at 40, also an UNCLE  . Coronary artery disease Paternal Grandmother   . Diabetes Mother   . Hypertension Mother   . Heart failure Maternal Grandmother   . Esophageal cancer Paternal Grandfather   . Colon cancer Neg Hx   . Stroke Neg Hx  Medication List       Accurate as of 08/18/15  5:18 PM. Always use your most recent med list.          aspirin 81 MG tablet Take 81 mg by mouth daily.   atorvastatin 10 MG tablet Commonly known as:  LIPITOR Take 1 tablet (10 mg total) by mouth daily.   fish oil-omega-3 fatty acids 1000 MG capsule Take 1 g by mouth daily.   ibuprofen 800 MG tablet Commonly known as:  ADVIL,MOTRIN Take 800 mg by mouth every 8 (eight) hours as needed.   Vitamin D 1000 units capsule Take 1,000 Units by mouth daily.          Objective:   Physical Exam BP 126/70 (BP Location: Left Arm, Patient Position: Sitting, Cuff Size: Normal)   Pulse (!) 58   Temp 98.4 F (36.9 C) (Oral)   Resp 14   Ht 5\' 11"  (1.803 m)   Wt 258 lb 8 oz (117.3 kg)   SpO2 97%   BMI 36.05 kg/m     General:   Well developed, well nourished . NAD.  Neck: No  thyromegaly  HEENT:  Normocephalic . Face symmetric, atraumatic Lungs:  CTA B Normal respiratory effort, no intercostal retractions, no accessory muscle use. Heart: RRR,  no murmur.  No pretibial edema bilaterally  Abdomen:  Not distended, soft, non-tender. No rebound or rigidity.   Groin: no hernias Skin: Exposed areas without rash. Not pale. Not jaundice Neurologic:  alert & oriented X3.  Speech normal, gait appropriate for age and unassisted Strength symmetric and appropriate for age.  Straight leg test negative, strength symmetric, DTRs: Absent right knee, otherwise decreased throughout. Psych: Cognition and judgment appear intact.  Cooperative with normal attention span and concentration.  Behavior appropriate. No anxious or depressed appearing.    Assessment & Plan:   Assessment Elevated BP Hyperlipidemia Elevated LFTs DJD Obesity OSA, + CPAP BCC, Dr Allyson Sabal Ocular rosacea HOH, L   PLAN: Elevated BP?: BP normal today Hyperlipidemia: Continue statins, checking labs DJD: Having back and L hip pain with some radicular features. Will get x-rays and refer to orthopedic. Obesity: Counseled about diet and exercise RTC 1 year

## 2015-08-18 NOTE — Assessment & Plan Note (Addendum)
We review his chart today: Td 2010;  pnm shot --2016; prevnar 07-2015  Reports previous hepatitis B shots Zostavax ~ 2010  Cscope w/ Dr Fredda Hammed ([place])  ~ 2008, had polyps, redundant colon , next Cscope is due ; refer to GI (first pt's choice would be  Dr Fredda Hammed since he know him)   Prostate cancer screening : Father had prostate cancer at age 67, DRE - PSA  wnl 2016 Diet- exercise discussed  Labs: CMP, FLP, TSH.

## 2015-08-18 NOTE — Assessment & Plan Note (Addendum)
Elevated BP?: BP normal today Hyperlipidemia: Continue statins, checking labs DJD: Having back and L hip pain with some radicular features. Will get x-rays and refer to orthopedic. Obesity: Counseled about diet and exercise RTC 1 year

## 2015-08-18 NOTE — Patient Instructions (Signed)
GO TO THE LAB : Get the blood work     GO TO THE FRONT DESK Schedule your next appointment for a  Physical 1 year     STOP BY THE FIRST FLOOR:  get the XR

## 2015-09-04 ENCOUNTER — Other Ambulatory Visit: Payer: Self-pay | Admitting: Internal Medicine

## 2015-09-22 DIAGNOSIS — M25552 Pain in left hip: Secondary | ICD-10-CM | POA: Diagnosis not present

## 2015-10-03 DIAGNOSIS — M25552 Pain in left hip: Secondary | ICD-10-CM | POA: Diagnosis not present

## 2015-10-06 ENCOUNTER — Ambulatory Visit (INDEPENDENT_AMBULATORY_CARE_PROVIDER_SITE_OTHER): Payer: Medicare Other | Admitting: Family Medicine

## 2015-10-06 ENCOUNTER — Encounter: Payer: Self-pay | Admitting: Family Medicine

## 2015-10-06 VITALS — BP 110/50 | HR 61 | Temp 98.3°F | Ht 71.0 in | Wt 259.4 lb

## 2015-10-06 DIAGNOSIS — R195 Other fecal abnormalities: Secondary | ICD-10-CM

## 2015-10-06 DIAGNOSIS — R1032 Left lower quadrant pain: Secondary | ICD-10-CM

## 2015-10-06 NOTE — Progress Notes (Signed)
Chief Complaint  Patient presents with  . Diarrhea    clear mucous-started on Wed and Thurs    Subjective: Patient is a 65 y.o. male here for strange bowel movements.  For the past 2 days, he had clear mucus and flatulence. No stool appeared. His gut was fine until the evening of the following night where he had another episode. His last normal bowel movement was 4 days ago. He had scant amounts of stool in his other 2 episodes this week. He denies nausea, vomiting, bleeding, fevers, pain, recent travel, medication change, or sick contacts.  ROS: Const: No fevers Abd: As noted in HPI  Family History  Problem Relation Age of Onset  . Prostate cancer Father 32    age 48s died at 63, also an UNCLE  . Coronary artery disease Paternal Grandmother   . Diabetes Mother   . Hypertension Mother   . Heart failure Maternal Grandmother   . Esophageal cancer Paternal Grandfather   . Colon cancer Neg Hx   . Stroke Neg Hx    Past Medical History:  Diagnosis Date  . BCC (basal cell carcinoma of skin)    dr Allyson Sabal  . Decreased hearing    Left, saw audiologist in 2011 per pt   . DJD (degenerative joint disease)   . Elevated liver function tests   . Hyperlipidemia    mixed  . Hypertension   . Obesity   . Ocular rosacea    on doxy as of 01-2013  . OSA (obstructive sleep apnea)    on CPAP   No Known Allergies  Current Outpatient Prescriptions:  .  aspirin 81 MG tablet, Take 81 mg by mouth daily.  , Disp: , Rfl:  .  atorvastatin (LIPITOR) 10 MG tablet, Take 1 tablet (10 mg total) by mouth daily., Disp: 90 tablet, Rfl: 3 .  Cholecalciferol (VITAMIN D) 1000 UNITS capsule, Take 1,000 Units by mouth daily.  , Disp: , Rfl:  .  fish oil-omega-3 fatty acids 1000 MG capsule, Take 1 g by mouth daily. , Disp: , Rfl:  .  ibuprofen (ADVIL,MOTRIN) 800 MG tablet, Take 800 mg by mouth every 8 (eight) hours as needed., Disp: , Rfl:   Objective: BP (!) 110/50 (BP Location: Left Arm, Patient Position:  Sitting, Cuff Size: Large)   Pulse 61   Temp 98.3 F (36.8 C) (Oral)   Ht 5\' 11"  (1.803 m)   Wt 259 lb 6.4 oz (117.7 kg)   SpO2 98%   BMI 36.18 kg/m  General: Awake, appears stated age HEENT: MMM, EOMi Heart: RRR, no murmurs Lungs: CTAB, no rales, wheezes or rhonchi. Normal effort Abd: BS+, soft, TTP in the LLQ, ND, no masses or organomegaly; Neg Murphy's, Rovsing's, McBurney's, and Carnett's.  Psych: Age appropriate judgment and insight, normal affect and mood  Assessment and Plan: LLQ abdominal pain  Mucus in stool  Discussed using a laxative to facilitate stool passage given LLQ and lack of poop. Could also keep going with conservative measures. Offered stool culture as well to rule out bacterial etiology. Seek care if he starts developing fevers, bleeding, worsening pain/nausea/vomiting. F/u prn otherwise. The patient voiced understanding and agreement to the plan.  [place], DO 10/06/15  2:36 PM

## 2015-10-06 NOTE — Progress Notes (Signed)
Pre visit review using our clinic review tool, if applicable. No additional management support is needed unless otherwise documented below in the visit note. 

## 2015-10-06 NOTE — Patient Instructions (Addendum)
You could try a stool softener or laxative. OK to continue with supportive care.  If you have worsening nausea or develop vomiting, have a fever, or worsening pain, seek emergent care.

## 2015-10-12 DIAGNOSIS — M25552 Pain in left hip: Secondary | ICD-10-CM | POA: Diagnosis not present

## 2015-10-20 DIAGNOSIS — Z1211 Encounter for screening for malignant neoplasm of colon: Secondary | ICD-10-CM | POA: Diagnosis not present

## 2015-10-20 DIAGNOSIS — D123 Benign neoplasm of transverse colon: Secondary | ICD-10-CM | POA: Diagnosis not present

## 2015-10-20 HISTORY — PX: COLONOSCOPY W/ POLYPECTOMY: SHX1380

## 2015-10-20 LAB — HM COLONOSCOPY

## 2015-10-31 ENCOUNTER — Encounter: Payer: Self-pay | Admitting: Internal Medicine

## 2015-12-01 DIAGNOSIS — M542 Cervicalgia: Secondary | ICD-10-CM | POA: Diagnosis not present

## 2015-12-08 DIAGNOSIS — M542 Cervicalgia: Secondary | ICD-10-CM | POA: Diagnosis not present

## 2015-12-29 ENCOUNTER — Ambulatory Visit (INDEPENDENT_AMBULATORY_CARE_PROVIDER_SITE_OTHER): Payer: Medicare Other | Admitting: Behavioral Health

## 2015-12-29 DIAGNOSIS — Z23 Encounter for immunization: Secondary | ICD-10-CM | POA: Diagnosis not present

## 2015-12-29 DIAGNOSIS — M50122 Cervical disc disorder at C5-C6 level with radiculopathy: Secondary | ICD-10-CM | POA: Diagnosis not present

## 2015-12-29 DIAGNOSIS — M542 Cervicalgia: Secondary | ICD-10-CM | POA: Diagnosis not present

## 2016-02-01 ENCOUNTER — Encounter: Payer: Self-pay | Admitting: Physical Therapy

## 2016-02-01 ENCOUNTER — Ambulatory Visit (INDEPENDENT_AMBULATORY_CARE_PROVIDER_SITE_OTHER): Payer: Medicare Other | Admitting: Physical Therapy

## 2016-02-01 DIAGNOSIS — M62838 Other muscle spasm: Secondary | ICD-10-CM | POA: Diagnosis not present

## 2016-02-01 DIAGNOSIS — M436 Torticollis: Secondary | ICD-10-CM | POA: Diagnosis not present

## 2016-02-01 DIAGNOSIS — M25552 Pain in left hip: Secondary | ICD-10-CM | POA: Diagnosis not present

## 2016-02-01 DIAGNOSIS — M542 Cervicalgia: Secondary | ICD-10-CM

## 2016-02-01 NOTE — Patient Instructions (Addendum)
Trigger Point Dry Needling  . What is Trigger Point Dry Needling (DN)? o DN is a physical therapy technique used to treat muscle pain and dysfunction. Specifically, DN helps deactivate muscle trigger points (muscle knots).  o A thin filiform needle is used to penetrate the skin and stimulate the underlying trigger point. The goal is for a local twitch response (LTR) to occur and for the trigger point to relax. No medication of any kind is injected during the procedure.   . What Does Trigger Point Dry Needling Feel Like?  o The procedure feels different for each individual patient. Some patients report that they do not actually feel the needle enter the skin and overall the process is not painful. Very mild bleeding may occur. However, many patients feel a deep cramping in the muscle in which the needle was inserted. This is the local twitch response.   Marland Kitchen How Will I feel after the treatment? o Soreness is normal, and the onset of soreness may not occur for a few hours. Typically this soreness does not last longer than two days.  o Bruising is uncommon, however; ice can be used to decrease any possible bruising.  o In rare cases feeling tired or nauseous after the treatment is normal. In addition, your symptoms may get worse before they get better, this period will typically not last longer than 24 hours.   . What Can I do After My Treatment? o Increase your hydration by drinking more water for the next 24 hours. o You may place ice or heat on the areas treated that have become sore, however, do not use heat on inflamed or bruised areas. Heat often brings more relief post needling. o You can continue your regular activities, but vigorous activity is not recommended initially after the treatment for 24 hours. o DN is best combined with other physical therapy such as strengthening, stretching, and other therapies.   Flexibility: Neck Retraction    Pull head straight back, keeping eyes and jaw  level. Repeat __3-5__ times per set. Do between patients.  Scapular Retraction (Standing)    With arms at sides, pinch shoulder blades together. Repeat __3-5__ times per set. Do through out the day between patients  Resisted Horizontal Abduction: Bilateral    Sit or stand, tubing in both hands, arms out in front. Keeping arms straight, pinch shoulder blades together and stretch arms out. Repeat _10___ times per set. Do __2-3__ sets per session. Do __1__ sessions per day.  Thoracic Self-Mobilization Stretch (Supine)    With small rolled towel at lower ribs level, gently lie back until stretch is felt. Hold __30-60__ seconds. Relax. Can press elbows down.  Repeat __1__ times per set. Do __1__ sets per session. Do _1___ sessions per day.   Thoracic Self-Mobilization (Supine) -     With rolled towel placed lengthwise at lower ribs level, lie back on towel with arms outstretched. Hold _120-180___ seconds. Relax. Repeat __1__ times per set. Do _1___ sets per session. Do _1___ sessions per day Copyright  [employer]. All rights reserved.

## 2016-02-01 NOTE — Therapy (Signed)
[facility] [place], [place], [address] Phone: [phone]   Fax:  [phone]  Physical Therapy Evaluation  Patient Details  Name: Dale [name] MRN: [id number] Date of Birth: Apr 08, 1950 Referring Provider: Dr Trenton Gammon  Encounter Date: 02/01/2016      PT End of Session - 02/01/16 1508    Visit Number 1   Number of Visits 8   Date for PT Re-Evaluation 02/29/16   PT Start Time 1509   PT Stop Time 1605   PT Time Calculation (min) 56 min      Past Medical History:  Diagnosis Date  . BCC (basal cell carcinoma of skin)    dr Allyson Sabal  . Decreased hearing    Left, saw audiologist in 2011 per pt   . DJD (degenerative joint disease)   . Elevated liver function tests   . Hyperlipidemia    mixed  . Hypertension   . Obesity   . Ocular rosacea    on doxy as of 01-2013  . OSA (obstructive sleep apnea)    on CPAP  . Tubular adenoma of colon 2017    Past Surgical History:  Procedure Laterality Date  . COLONOSCOPY W/ POLYPECTOMY  10/20/2015   tubular adenoma  . TONSILLECTOMY      There were no vitals filed for this visit.       Subjective Assessment - 02/01/16 1502    Subjective Pt reports about 10 yrs ago he had pain down the Lt UE, this responded to PT in [place].  Then it went away.  A couple of months ago he noticed pain in the upper Lt shoulder/neck.  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  Not dropping anything from the Lt hand.    Pertinent History had a hip injection this AM, had a cervical fx when he was young playing football - has had restricted ROM since   How long can you sit comfortably? 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    Patient Stated Goals hold x-ray up and look at it, get rid of pain   Currently in Pain? Yes   Pain Score 1   on a bad day 10/10 when he has a busy  day at work    Pain Location Neck   Pain Orientation Left  origin of levator, SCM and upper traps   Pain Descriptors / Indicators Aching   Pain Type Acute pain   Pain Onset More than a month ago   Pain Frequency Constant   Aggravating Factors  being up, sitting and standing.    Pain Relieving Factors lying down, advil   Effect of Pain on Daily Activities makes it difficult to work            [facility] PT Assessment - 02/01/16 0001      Assessment   Medical Diagnosis cervical radiculopathy C5-6   Referring Provider Dr Trenton Gammon   Onset Date/Surgical Date 12/02/15   Hand Dominance Right  ambidextrous   Next MD Visit PRN   Prior Therapy not for this      Precautions   Precautions None     Balance Screen   Has the patient fallen in the past 6 months No     Prior Function   Level of Independence Independent   Vocation Full time employment   Vocation Requirements dentist  Leisure nature photography, has trouble getting up/down from the ground, uses a tripod to hold his camera.      Observation/Other Assessments   Focus on Therapeutic Outcomes (FOTO)  40% limited     Posture/Postural Control   Posture/Postural Control Postural limitations   Postural Limitations Forward head;Rounded Shoulders;Increased thoracic kyphosis  extra abdominal girth     ROM / Strength   AROM / PROM / Strength AROM;Strength;PROM     AROM   AROM Assessment Site Shoulder;Cervical   Right/Left Shoulder --  bilat WNL   Cervical Flexion WNL   Cervical Extension 27  feels like he hits a block   Cervical - Right Side Bend 17   Cervical - Left Side Bend 27  stretching on Lt   Cervical - Right Rotation 42  no pain   Cervical - Left Rotation 58     PROM   PROM Assessment Site Cervical   Cervical Extension good through upper cervical , blocked in lower cervical      Strength   Strength Assessment Site Shoulder;Elbow   Right/Left Shoulder --  bilat WNL except Lt ER 4+/5 with slight pain    Right/Left Elbow --  bilat WNL     Palpation   Spinal mobility hypomobile in cervical and thoracic spine   Palpation comment tightness in bilat upper traps, levators      Special Tests    Special Tests --  negative spurlings bilat.                    [facility] Adult PT Treatment/Exercise - 02/01/16 0001      Exercises   Exercises Neck     Neck Exercises: Theraband   Horizontal ABduction 20 reps;Red  seated     Neck Exercises: Seated   Neck Retraction 5 reps   Other Seated Exercise scapular retractions x 5 reps     Neck Exercises: Supine   Other Supine Exercise over pool noodle horizontal in thoracic spine and vertical along whole spine., requires pillow support for head due to posture.                 PT Education - 02/01/16 1608    Education provided Yes   Education Details HEP & DN   Person(s) Educated Patient   Methods Explanation;Demonstration;Handout   Comprehension Returned demonstration;Verbalized understanding             PT Long Term Goals - 02/01/16 1503      PT LONG TERM GOAL #1   Title I with advanced HEP (02/29/16)    Time 4   Period Weeks   Status New     PT LONG TERM GOAL #2   Title report overall reduction on pain =/> 75% at the end of his work day.  (02/29/16)    Time 4   Period Weeks   Status New     PT LONG TERM GOAL #3   Title improve FOTO =/< 31% limited, CJ level (02/29/16)    Time 4   Period Weeks   Status New     PT LONG TERM GOAL #4   Title report =/> 50% improvement in feelings of increased cervical ROM ( 02/29/16)    Time 4   Period Weeks   Status New               Plan - 02/01/16 1611    Clinical Impression Statement 66 yo male presents with a couple month onset of acute  neck pain with intermittent radicular symptoms into the Lt UE.  He works full time as a Pharmacist, community and finds he has increased symptoms at the end of the day.  He has postural changes moving into a forward flexed position, his spine is  hypomobile and he has trigger points in the upper shoulder and neck musculature.  Overall his strength is pretty good.  Saralyn Pilar reports an old neck injury and doesn't think he has had full cervical motion since then.    Rehab Potential Good   PT Frequency 2x / week   PT Duration 4 weeks   PT Treatment/Interventions Moist Heat;Traction;Ultrasound;Therapeutic exercise;Dry needling;Taping;Manual techniques;Neuromuscular re-education;Cryotherapy;Electrical Stimulation;Patient/family education   PT Next Visit Plan manual work to neck and upper shoulders,possible DN.  Postural re-ed exercise.    Consulted and Agree with Plan of Care Patient      Patient will benefit from skilled therapeutic intervention in order to improve the following deficits and impairments:  Postural dysfunction, Pain, Impaired UE functional use, Hypomobility, Decreased range of motion  Visit Diagnosis: Pain, neck - Plan: PT plan of care cert/re-cert  Stiffness of neck - Plan: PT plan of care cert/re-cert  Other muscle spasm - Plan: PT plan of care cert/re-cert     Problem List Patient Active Problem List   Diagnosis Date Noted  . PCP NOTES >>>>>>>>>>>>> 08/18/2015  . BCC (basal cell carcinoma of skin)   . Ocular rosacea   . Annual physical exam 08/31/2010  . NOCTURIA 09/22/2009  . Obstructive sleep apnea 09/15/2009  . DECREASED HEARING 06/13/2009  . Elevated LFTs 12/09/2008  . DJD (degenerative joint disease) 06/03/2008  . Dyslipidemia 05/11/2008  . OBESITY 05/11/2008  . Elevated BP 05/11/2008    Jeral Pinch PT  02/01/2016, 4:17 PM  [facility] [place], [place], [address] Phone: [phone]   Fax:  [phone]  Name: KEIYON LARE MRN: [id number] Date of Birth: [birth year]

## 2016-02-09 ENCOUNTER — Encounter: Payer: Self-pay | Admitting: Rehabilitative and Restorative Service Providers"

## 2016-02-09 ENCOUNTER — Ambulatory Visit (INDEPENDENT_AMBULATORY_CARE_PROVIDER_SITE_OTHER): Payer: Medicare Other | Admitting: Rehabilitative and Restorative Service Providers"

## 2016-02-09 DIAGNOSIS — M436 Torticollis: Secondary | ICD-10-CM | POA: Diagnosis not present

## 2016-02-09 DIAGNOSIS — M542 Cervicalgia: Secondary | ICD-10-CM

## 2016-02-09 DIAGNOSIS — M62838 Other muscle spasm: Secondary | ICD-10-CM | POA: Diagnosis not present

## 2016-02-09 NOTE — Therapy (Signed)
[place]ale [name], [place], [address] Phone: [phone]   Fax:  [phone]  Physical Therapy Treatment  Patient Details  Name: Dale [name] MRN: [id number] Date of Birth: 05-11-1950 Referring Provider: Dr Trenton Gammon  Encounter Date: 02/09/2016      PT End of Session - 02/09/16 0848    Visit Number 2   Number of Visits 8   Date for PT Re-Evaluation 02/29/16   PT Start Time [id number]   PT Stop Time 0942   PT Time Calculation (min) 55 min   Activity Tolerance Patient tolerated treatment well      Past Medical History:  Diagnosis Date  . BCC (basal cell carcinoma of skin)    dr Allyson Sabal  . Decreased hearing    Left, saw audiologist in 2011 per pt   . DJD (degenerative joint disease)   . Elevated liver function tests   . Hyperlipidemia    mixed  . Hypertension   . Obesity   . Ocular rosacea    on doxy as of 01-2013  . OSA (obstructive sleep apnea)    on CPAP  . Tubular adenoma of colon 2017    Past Surgical History:  Procedure Laterality Date  . COLONOSCOPY W/ POLYPECTOMY  10/20/2015   tubular adenoma  . TONSILLECTOMY      There were no vitals filed for this visit.      Subjective Assessment - 02/09/16 0852    Subjective Patient reports that he has been working on the exercises at home. Has had a good week.    Currently in Pain? No/denies            [facility] PT Assessment - 02/09/16 0001      Assessment   Medical Diagnosis cervical radiculopathy C5-6   Referring Provider Dr Trenton Gammon   Onset Date/Surgical Date 12/02/15   Hand Dominance Right  ambidextrous   Next MD Visit PRN   Prior Therapy not for this      Palpation   Palpation comment tightness noted ant/lat/post cervical musculature; upper traps/leveator; pecs bilat Lt > Rt      Special Tests    Special Tests --  (+) neural tension test Lt > Rt UE                     [facility] Adult PT Treatment/Exercise -  02/09/16 0001      Therapeutic Activites    Therapeutic Activities --  myofacial ball release work in standing      Neuro Re-ed    Neuro Re-ed Details  working on posture and alignment      Neck Exercises: Theraband   Scapula Retraction 20 reps;Red  w/ noodle along spine    Shoulder Extension 15 reps;Red   Rows Red;15 reps   Horizontal ABduction 20 reps;Red  seated     Neck Exercises: Seated   Neck Retraction 5 reps;15 secs     Neck Exercises: Supine   Neck Retraction 5 reps;15 secs   Other Supine Exercise occipital inhibition with larger massage blocks    Other Supine Exercise neural mobilization 1 min x 2 Lt tighter than Rt      Moist Heat Therapy   Number Minutes Moist Heat 20 Minutes   Moist Heat Location Cervical     Electrical Stimulation   Electrical Stimulation Location bilat cervical    Electrical Stimulation Action IFC   Electrical Stimulation Parameters to tolerance   Electrical  Stimulation Goals Tone     Manual Therapy   Manual therapy comments pt supine    Joint Mobilization cervical CPA mobs   Soft tissue mobilization anterior/lateral/posterior cervical musculature; upper traps/leveator   Myofascial Release anterior chest   Passive ROM cervical flexion; lateral flexion; flexion with slight rotation - note significant limitations in cervical mobility                 PT Education - 02/09/16 0900    Education provided Yes   Education Details HEP TENS    Person(s) Educated Patient   Methods Explanation;Demonstration;Tactile cues;Verbal cues;Handout   Comprehension Verbalized understanding;Returned demonstration;Verbal cues required;Tactile cues required             PT Long Term Goals - 02/09/16 0853      PT LONG TERM GOAL #1   Title I with advanced HEP (02/29/16)    Time 4   Period Weeks   Status On-going     PT LONG TERM GOAL #2   Title report overall reduction on pain =/> 75% at the end of his work day.  (02/29/16)    Time 4    Period Weeks   Status On-going     PT LONG TERM GOAL #3   Title improve FOTO =/< 31% limited, CJ level (02/29/16)    Time 4   Period Weeks   Status On-going     PT LONG TERM GOAL #4   Title report =/> 50% improvement in feelings of increased cervical ROM ( 02/29/16)    Time 4   Period Weeks   Status On-going               Plan - 02/09/16 [id number]    Clinical Impression Statement Patient reports that he has had a pretty good week. He has worked on exercises at home. Positive neural tension test - added neural mobilization. Added exercises for home without difficulty. gradual progress toward stated goals of therapy.    Rehab Potential Good   PT Frequency 2x / week   PT Duration 4 weeks   PT Treatment/Interventions Moist Heat;Traction;Ultrasound;Therapeutic exercise;Dry needling;Taping;Manual techniques;Neuromuscular re-education;Cryotherapy;Electrical Stimulation;Patient/family education   PT Next Visit Plan manual work to neck and upper shoulders,possible DN.  Postural re-ed exercise.    Consulted and Agree with Plan of Care Patient      Patient will benefit from skilled therapeutic intervention in order to improve the following deficits and impairments:  Postural dysfunction, Pain, Impaired UE functional use, Hypomobility, Decreased range of motion  Visit Diagnosis: Pain, neck  Stiffness of neck  Other muscle spasm     Problem List Patient Active Problem List   Diagnosis Date Noted  . PCP NOTES >>>>>>>>>>>>> 08/18/2015  . BCC (basal cell carcinoma of skin)   . Ocular rosacea   . Annual physical exam 08/31/2010  . NOCTURIA 09/22/2009  . Obstructive sleep apnea 09/15/2009  . DECREASED HEARING 06/13/2009  . Elevated LFTs 12/09/2008  . DJD (degenerative joint disease) 06/03/2008  . Dyslipidemia 05/11/2008  . OBESITY 05/11/2008  . Elevated BP 05/11/2008    Dale [name] Nilda Simmer PT, MPH  02/09/2016, 10:12 AM  [facility] [place], [place], [address] Phone: [phone]   Fax:  [phone]  Name: Dale [name] MRN: [id number] Date of Birth: 1950-10-30

## 2016-02-09 NOTE — Patient Instructions (Addendum)
Resisted External Rotation: in Neutral - Bilateral   PALMS UP Sit or stand, tubing in both hands, elbows at sides, bent to 90, forearms forward. Pinch shoulder blades together and rotate forearms out. Keep elbows at sides. Repeat __10__ times per set. Do _2-3___ sets per session. Do _2-3___ sessions per day.   Low Row: Standing   Face anchor, feet shoulder width apart. Palms up, pull arms back, squeezing shoulder blades together. Repeat 10__ times per set. Do 2-3__ sets per session. Do 2-3__ sessions per week. Anchor Height: Waist   Strengthening: Resisted Extension   Hold tubing in right hand, arm forward. Pull arm back, elbow straight. Repeat _10___ times per set. Do 2-3____ sets per session. Do 2-3____ sessions per day.    Neurovascular: Median Nerve Glide With Cervical Bias - Supine    Lie with neck supported, right arm out to side, elbow straight, thumb down, fingers and wrist bent back. Slowly move opposite side ear toward shoulder as far as possible without pain. 1 min hold Repeat __2__ times per set. Two times/day   TENS UNIT: This is helpful for muscle pain and spasm.   Search and Purchase a TENS 7000 2nd edition at www.tenspros.com. It should be less than $30.     TENS unit instructions: Do not shower or bathe with the unit on Turn the unit off before removing electrodes or batteries If the electrodes lose stickiness add a drop of water to the electrodes after they are disconnected from the unit and place on plastic sheet. If you continued to have difficulty, call the TENS unit company to purchase more electrodes. Do not apply lotion on the skin area prior to use. Make sure the skin is clean and dry as this will help prolong the life of the electrodes. After use, always check skin for unusual red areas, rash or other skin difficulties. If there are any skin problems, does not apply electrodes to the same area. Never remove the electrodes from the unit by pulling  the wires. Do not use the TENS unit or electrodes other than as directed. Do not change electrode placement without consultating your therapist or physician. Keep 2 fingers with between each electrode.

## 2016-02-16 ENCOUNTER — Ambulatory Visit (INDEPENDENT_AMBULATORY_CARE_PROVIDER_SITE_OTHER): Payer: Medicare Other | Admitting: Rehabilitative and Restorative Service Providers"

## 2016-02-16 ENCOUNTER — Encounter: Payer: Self-pay | Admitting: Rehabilitative and Restorative Service Providers"

## 2016-02-16 DIAGNOSIS — M62838 Other muscle spasm: Secondary | ICD-10-CM

## 2016-02-16 DIAGNOSIS — M436 Torticollis: Secondary | ICD-10-CM

## 2016-02-16 DIAGNOSIS — M542 Cervicalgia: Secondary | ICD-10-CM

## 2016-02-16 NOTE — Therapy (Signed)
[place], [place], [address] Phone: [phone]   Fax:  [phone]  Physical Therapy Treatment  Patient Details  Name: Dale [name] MRN: [id number] Date of Birth: [birth year] Referring Provider: Dr Trenton Gammon  Encounter Date: 02/16/2016      PT End of Session - 02/16/16 0845    Visit Number 3   Number of Visits 8   Date for PT Re-Evaluation 02/29/16   PT Start Time 0845   PT Stop Time 0941   PT Time Calculation (min) 56 min   Activity Tolerance Patient tolerated treatment well      Past Medical History:  Diagnosis Date  . BCC (basal cell carcinoma of skin)    dr Allyson Sabal  . Decreased hearing    Left, saw audiologist in 2011 per pt   . DJD (degenerative joint disease)   . Elevated liver function tests   . Hyperlipidemia    mixed  . Hypertension   . Obesity   . Ocular rosacea    on doxy as of 01-2013  . OSA (obstructive sleep apnea)    on CPAP  . Tubular adenoma of colon 2017    Past Surgical History:  Procedure Laterality Date  . COLONOSCOPY W/ POLYPECTOMY  10/20/2015   tubular adenoma  . TONSILLECTOMY      There were no vitals filed for this visit.      Subjective Assessment - 02/16/16 0846    Subjective Patient reports that he is feeling pretty good. Has had a good week. He is working on the posture and notices that he can straighten up his spine when he is Mindful of his posture. Has worked with the noodle on the floor and feels that helps as well.    Currently in Pain? No/denies            [facility] PT Assessment - 02/16/16 0001      Assessment   Medical Diagnosis cervical radiculopathy C5-6   Referring Provider Dr Trenton Gammon   Onset Date/Surgical Date 12/02/15   Hand Dominance Right  ambidextrous   Next MD Visit PRN     AROM   Cervical Flexion 50   Cervical Extension 41   Cervical - Right Side Bend 29   Cervical - Left Side Bend 27   Cervical - Right  Rotation 49   Cervical - Left Rotation 62                     [facility] Adult PT Treatment/Exercise - 02/16/16 0001      Neck Exercises: Theraband   Scapula Retraction 20 reps;Red  w/ noodle along spine    Shoulder Extension 15 reps;Red   Rows Red;15 reps  added row at 45 deg red TB x 15   Shoulder External Rotation 15 reps;Red     Neck Exercises: Standing   Other Standing Exercises W's x 10      Neck Exercises: Seated   Neck Retraction 5 reps;15 secs     Neck Exercises: Supine   Neck Retraction 5 reps;15 secs   Other Supine Exercise neural mobilization 1 min x 2 Lt tighter than Rt      Moist Heat Therapy   Number Minutes Moist Heat 20 Minutes   Moist Heat Location Cervical  Lt shoulder     Electrical Stimulation   Electrical Stimulation Location Lt cervical Lt pecs   Electrical Stimulation Action IFC   Electrical Stimulation Parameters to  tolerance   Electrical Stimulation Goals Tone     Manual Therapy   Manual therapy comments pt supine    Joint Mobilization cervical CPA mobs; mobs Lt clavical/AC joint   Soft tissue mobilization anterior/lateral/posterior cervical musculature; upper traps/leveator; pecs   Myofascial Release anterior chest; upper traps    Passive ROM cervical flexion; lateral flexion; flexion with slight rotation - note significant limitations in cervical mobility      Neck Exercises: Stretches   Other Neck Stretches 3 way doorway 30 sec x 3                 PT Education - 02/16/16 0907    Education provided Yes   Education Details HEP   Person(s) Educated Patient   Methods Explanation;Demonstration;Tactile cues;Verbal cues;Handout   Comprehension Verbalized understanding;Returned demonstration;Verbal cues required;Tactile cues required             PT Long Term Goals - 02/16/16 0845      PT LONG TERM GOAL #1   Title I with advanced HEP (02/29/16)    Time 4   Period Weeks   Status On-going     PT LONG TERM GOAL #2    Title report overall reduction on pain =/> 75% at the end of his work day.  (02/29/16)    Time 4   Period Weeks   Status On-going     PT LONG TERM GOAL #3   Title improve FOTO =/< 31% limited, CJ level (02/29/16)    Time 4   Period Weeks   Status On-going     PT LONG TERM GOAL #4   Title report =/> 50% improvement in feelings of increased cervical ROM ( 02/29/16)    Time 4   Period Weeks   Status On-going               Plan - 02/16/16 [id number]    Clinical Impression Statement Patient is progressing well with rehab. He has less tightness to palpation and demonstrates improving cervical mobilty as well as improved posture and alignment. Progressing well toward stated goals of therapy.    Rehab Potential Good   PT Frequency 2x / week   PT Duration 4 weeks   PT Treatment/Interventions Moist Heat;Traction;Ultrasound;Therapeutic exercise;Dry needling;Taping;Manual techniques;Neuromuscular re-education;Cryotherapy;Electrical Stimulation;Patient/family education   PT Next Visit Plan manual work to neck and upper shoulders,possible DN.  Postural re-ed exercise.       Patient will benefit from skilled therapeutic intervention in order to improve the following deficits and impairments:  Postural dysfunction, Pain, Impaired UE functional use, Hypomobility, Decreased range of motion  Visit Diagnosis: Pain, neck  Stiffness of neck  Other muscle spasm     Problem List Patient Active Problem List   Diagnosis Date Noted  . PCP NOTES >>>>>>>>>>>>> 08/18/2015  . BCC (basal cell carcinoma of skin)   . Ocular rosacea   . Annual physical exam 08/31/2010  . NOCTURIA 09/22/2009  . Obstructive sleep apnea 09/15/2009  . DECREASED HEARING 06/13/2009  . Elevated LFTs 12/09/2008  . DJD (degenerative joint disease) 06/03/2008  . Dyslipidemia 05/11/2008  . OBESITY 05/11/2008  . Elevated BP 05/11/2008    Kinsly Hild Nilda Simmer PT, MPH  02/16/2016, 9:33 AM  [facility] [place], [place], [address] Phone: [phone]   Fax:  [phone]  Name: Dale [name] MRN: [id number] Date of Birth: 1950-05-13

## 2016-02-16 NOTE — Patient Instructions (Addendum)
Scapular Retraction: Rowing (Eccentric) - Arms - 45 Degrees (Resistance Band)    Hold end of band in each hand. Pull back until elbows are even with trunk. Keep elbows out from sides at 45, thumbs up. Slowly release for 3-5 seconds. Use resistance band. _10__ reps per set, ___ sets per day 1 time/day  Strengthening: Resisted External Rotation    Hold tubing in right hand, elbow at side and forearm across body. Rotate forearm out. Repeat _10___ times per set. Do _1-3___ sets per session. Do __1__ sessions per day.    Scapular Retraction: Elbow Flexion (Standing)    With elbows bent to 90, pinch shoulder blades together and rotate arms out, keeping elbows bent. Repeat __10__ times per set. Do ___1-2_ sets per session. Do __2-3__ sessions per day.   Scapula Adduction With Pectoralis Stretch: Low - Standing   Shoulders at 45 hands even with shoulders, keeping weight through legs, shift weight forward until you feel pull or stretch through the front of your chest. Hold _30__ seconds. Do _3__ times, _2-4__ times per day.   Scapula Adduction With Pectoralis Stretch: Mid-Range - Standing   Shoulders at 90 elbows even with shoulders, keeping weight through legs, shift weight forward until you feel pull or strength through the front of your chest. Hold __30_ seconds. Do _3__ times, __2-4_ times per day.   Scapula Adduction With Pectoralis Stretch: High - Standing   Shoulders at 120 hands up high on the doorway, keeping weight on feet, shift weight forward until you feel pull or stretch through the front of your chest. Hold _30__ seconds. Do _3__ times, _2-3__ times per day.

## 2016-02-23 ENCOUNTER — Ambulatory Visit (INDEPENDENT_AMBULATORY_CARE_PROVIDER_SITE_OTHER): Payer: Medicare Other | Admitting: Rehabilitative and Restorative Service Providers"

## 2016-02-23 ENCOUNTER — Encounter: Payer: Self-pay | Admitting: Rehabilitative and Restorative Service Providers"

## 2016-02-23 DIAGNOSIS — M436 Torticollis: Secondary | ICD-10-CM | POA: Diagnosis not present

## 2016-02-23 DIAGNOSIS — M542 Cervicalgia: Secondary | ICD-10-CM | POA: Diagnosis present

## 2016-02-23 DIAGNOSIS — M62838 Other muscle spasm: Secondary | ICD-10-CM | POA: Diagnosis not present

## 2016-02-23 NOTE — Patient Instructions (Addendum)
Scapular Retraction: Elbow Flexion (Standing)    With elbows bent to 90, pinch shoulder blades together and rotate arms out, keeping elbows bent. Repeat __10__ times per set. Do _1-2___ sets per session. Do _several___ sessions per day.    Side Bend, Sitting    Sit, head in comfortable, centered position, chin slightly tucked. Gently tilt head, bringing ear toward same-side shoulder. Hold __15-20_ seconds.  Repeat __3_ times per session. Do __2-4_ sessions per day.    Levator Scapula Stretch, Sitting    Sit, one hand tucked under hip on side to be stretched, other hand over top of head. Turn head toward other side and look down. Use hand on head to gently stretch neck in that position. Hold _20__ seconds. Repeat _2-3__ times per session. Do __2-3_ sessions per day.  Scapular Retraction: Rowing (Eccentric) - Arms - 45 Degrees (Resistance Band)    Hold end of band in each hand. Pull back until elbows are even with trunk. Keep elbows out from sides at 45, thumbs up. Slowly release for 3-5 seconds. Use ___green_____ resistance band. 10___ reps per set, 1-3___ sets per day, _1 time/day

## 2016-02-23 NOTE — Therapy (Addendum)
[name], [place], [address] Phone: [phone]   Fax:  [phone]  Physical Therapy Treatment  Patient Details  Name: Dale [name] MRN: [id number] Date of Birth: 1951-01-19 Referring Provider: Dr Trenton Gammon  Encounter Date: 02/23/2016      PT End of Session - 02/23/16 0856    Visit Number 4   Number of Visits 8   Date for PT Re-Evaluation 02/29/16   PT Start Time 0842   PT Stop Time 0939   PT Time Calculation (min) 57 min   Activity Tolerance Patient tolerated treatment well      Past Medical History:  Diagnosis Date  . BCC (basal cell carcinoma of skin)    dr Allyson Sabal  . Decreased hearing    Left, saw audiologist in 2011 per pt   . DJD (degenerative joint disease)   . Elevated liver function tests   . Hyperlipidemia    mixed  . Hypertension   . Obesity   . Ocular rosacea    on doxy as of 01-2013  . OSA (obstructive sleep apnea)    on CPAP  . Tubular adenoma of colon 2017    Past Surgical History:  Procedure Laterality Date  . COLONOSCOPY W/ POLYPECTOMY  10/20/2015   tubular adenoma  . TONSILLECTOMY      There were no vitals filed for this visit.      Subjective Assessment - 02/23/16 0902    Subjective Pain free - pleased with progress.            [facility] PT Assessment - 02/23/16 0001      Assessment   Medical Diagnosis cervical radiculopathy C5-6   Referring Provider Dr Trenton Gammon   Onset Date/Surgical Date 12/02/15   Hand Dominance Right  ambidextrous   Next MD Visit PRN     Observation/Other Assessments   Focus on Therapeutic Outcomes (FOTO)  26% limitation      AROM   Cervical Flexion 50   Cervical Extension 43   Cervical - Right Side Bend 30   Cervical - Left Side Bend 27   Cervical - Right Rotation 52   Cervical - Left Rotation 61     Palpation   Palpation comment improving tightness noted ant/lat/post cervical musculature; upper traps/leveator;  pecs bilat Lt > Rt                      [facility] Adult PT Treatment/Exercise - 02/23/16 0001      Neck Exercises: Theraband   Scapula Retraction 20 reps;Red  w/ noodle along spine    Shoulder Extension 15 reps;Green   Rows 15 reps;Green  added row at 45 deg red TB x 15   Shoulder External Rotation 15 reps;Green   Other Theraband Exercises row at 45 deg green TB    Other Theraband Exercises W's      Neck Exercises: Seated   Neck Retraction 5 reps;15 secs   Lateral Flexion Right;Left;5 reps  20 sec hold    Other Seated Exercise leveator stretch 20 sec x 3 each      Neck Exercises: Supine   Neck Retraction 5 reps;15 secs   Other Supine Exercise neural mobilization 1 min x 2 Lt tighter than Rt      Moist Heat Therapy   Number Minutes Moist Heat 20 Minutes   Moist Heat Location Cervical  Lt shoulder     Electrical Stimulation   Electrical Stimulation  Location Lt cervical Lt Librarian, academic IFC   Electrical Stimulation Parameters to tolerance   Electrical Stimulation Goals Tone     Manual Therapy   Manual therapy comments pt supine    Joint Mobilization cervical CPA mobs; mobs Lt clavical/AC joint   Soft tissue mobilization anterior/lateral/posterior cervical musculature; upper traps/leveator; pecs   Myofascial Release anterior chest; upper traps    Passive ROM cervical flexion; lateral flexion; flexion with slight rotation - note significant limitations in cervical mobility      Neck Exercises: Stretches   Other Neck Stretches 3 way doorway 30 sec x 3                 PT Education - 02/23/16 0913    Education provided Yes   Education Details HEP   Person(s) Educated Patient   Methods Explanation;Demonstration;Tactile cues;Verbal cues;Handout   Comprehension Verbalized understanding;Returned demonstration;Verbal cues required;Tactile cues required             PT Long Term Goals - 02/23/16 1024      PT LONG TERM GOAL #1    Title I with advanced HEP (02/29/16)    Time 4   Period Weeks   Status Achieved     PT LONG TERM GOAL #2   Title report overall reduction on pain =/> 75% at the end of his work day.  (02/29/16)    Time 4   Period Weeks   Status Achieved     PT LONG TERM GOAL #3   Title improve FOTO =/< 31% limited, CJ level (02/29/16)    Time 4   Period Weeks   Status Achieved     PT LONG TERM GOAL #4   Title report =/> 50% improvement in feelings of increased cervical ROM ( 02/29/16)    Time 4   Period Weeks   Status Achieved               Plan - 02/23/16 1025    Clinical Impression Statement Dale [name] reports that he is doing well and is pleased with progress. He has had no symptoms in the past week. Goals of therapy are accomplished. Patient will continue with independent HEP and call with any questions or problems.    Rehab Potential Good   PT Frequency 2x / week   PT Duration 4 weeks   PT Treatment/Interventions Moist Heat;Traction;Ultrasound;Therapeutic exercise;Dry needling;Taping;Manual techniques;Neuromuscular re-education;Cryotherapy;Electrical Stimulation;Patient/family education   PT Next Visit Plan Patient will continue with HEP and call with any questions or problems. We will keep chart open for 3-4 weeks to see if symptoms return    Consulted and Agree with Plan of Care Patient      Patient will benefit from skilled therapeutic intervention in order to improve the following deficits and impairments:  Postural dysfunction, Pain, Impaired UE functional use, Hypomobility, Decreased range of motion  Visit Diagnosis: Pain, neck  Stiffness of neck  Other muscle spasm     Problem List Patient Active Problem List   Diagnosis Date Noted  . PCP NOTES >>>>>>>>>>>>> 08/18/2015  . BCC (basal cell carcinoma of skin)   . Ocular rosacea   . Annual physical exam 08/31/2010  . NOCTURIA 09/22/2009  . Obstructive sleep apnea 09/15/2009  . DECREASED HEARING 06/13/2009  . Elevated LFTs  12/09/2008  . DJD (degenerative joint disease) 06/03/2008  . Dyslipidemia 05/11/2008  . OBESITY 05/11/2008  . Elevated BP 05/11/2008    Lesle Faron Nilda Simmer PT, MPH  02/23/2016, 10:29 AM  [place], [place], [address] Phone: [phone]   Fax:  [phone]  Name: Dale [name] MRN: [id number] Date of Birth: Feb 20, 1950  PHYSICAL THERAPY DISCHARGE SUMMARY  Visits from Start of Care: 4  Current functional level related to goals / functional outcomes: See last progress note for discharge status   Remaining deficits: Unknown    Education / Equipment: HEP Plan: Patient agrees to discharge.  Patient goals were partially met. Patient is being discharged due to not returning since the last visit.  ?????    Dale [name] P. Helene Kelp PT, MPH 04/05/16 3:53 PM

## 2016-03-28 DIAGNOSIS — L821 Other seborrheic keratosis: Secondary | ICD-10-CM | POA: Diagnosis not present

## 2016-03-28 DIAGNOSIS — L570 Actinic keratosis: Secondary | ICD-10-CM | POA: Diagnosis not present

## 2016-03-28 DIAGNOSIS — L820 Inflamed seborrheic keratosis: Secondary | ICD-10-CM | POA: Diagnosis not present

## 2016-03-28 DIAGNOSIS — D485 Neoplasm of uncertain behavior of skin: Secondary | ICD-10-CM | POA: Diagnosis not present

## 2016-05-02 ENCOUNTER — Encounter: Payer: Medicare Other | Admitting: Physical Therapy

## 2016-05-03 ENCOUNTER — Encounter: Payer: Self-pay | Admitting: Internal Medicine

## 2016-05-03 ENCOUNTER — Ambulatory Visit (INDEPENDENT_AMBULATORY_CARE_PROVIDER_SITE_OTHER): Payer: Medicare Other | Admitting: Internal Medicine

## 2016-05-03 VITALS — BP 124/68 | HR 64 | Temp 98.1°F | Resp 14 | Ht 71.0 in | Wt 261.4 lb

## 2016-05-03 DIAGNOSIS — R1032 Left lower quadrant pain: Secondary | ICD-10-CM

## 2016-05-03 DIAGNOSIS — M542 Cervicalgia: Secondary | ICD-10-CM

## 2016-05-03 DIAGNOSIS — E785 Hyperlipidemia, unspecified: Secondary | ICD-10-CM | POA: Diagnosis not present

## 2016-05-03 MED ORDER — PREDNISONE 10 MG PO TABS: ORAL_TABLET | ORAL | 0 refills | Status: DC

## 2016-05-03 MED ORDER — CYCLOBENZAPRINE HCL 10 MG PO TABS: 10.0000 mg | ORAL_TABLET | Freq: Every evening | ORAL | 0 refills | Status: DC | PRN

## 2016-05-03 NOTE — Patient Instructions (Signed)
Heating pad twice a day  Take prednisone as prescribed  Try to moderate the amount of ibuprofen you take.  Tylenol 500 mg 2 tablets every 8 hours as needed  Flexeril may help, take 1 at night, will cause drowsiness  Call if not gradually improving  Next month, tried to go back on atorvastatin for your cholesterol.

## 2016-05-03 NOTE — Progress Notes (Signed)
Pre visit review using our clinic review tool, if applicable. No additional management support is needed unless otherwise documented below in the visit note. 

## 2016-05-03 NOTE — Progress Notes (Signed)
Subjective:    Patient ID: Dale [name], male    DOB: 08-31-1950, 66 y.o.   MRN: [id number]  DOS:  05/03/2016 Type of visit - description : Acute visit Interval history: Has a history of on and off right neck pain, this "flareup" started 3 days ago, he woke up with pain. Located at the right side of the neck, right trapezoid and supraclavicular area. It is positional, certain positions make it better or worse. Denies any injury, fever, chills. Also, he is self DC Lipitor a few months ago due to severe leg cramps.   Review of Systems Was seen with LLQ pain 09-2015, eventually he figured out he was related to the left hip, had a injection and feels better. Denies any arm paresthesias. No gait difficulties or bladder or bowel incontinence  Past Medical History:  Diagnosis Date  . BCC (basal cell carcinoma of skin)    dr Allyson Sabal  . Decreased hearing    Left, saw audiologist in 2011 per pt   . DJD (degenerative joint disease)   . Elevated liver function tests   . Hyperlipidemia    mixed  . Hypertension   . Obesity   . Ocular rosacea    on doxy as of 01-2013  . OSA (obstructive sleep apnea)    on CPAP  . Tubular adenoma of colon 2017    Past Surgical History:  Procedure Laterality Date  . COLONOSCOPY W/ POLYPECTOMY  10/20/2015   tubular adenoma  . TONSILLECTOMY      Social History   Social History  . Marital status: Married    Spouse name: N/A  . Number of children: 3  . Years of education: N/A   Occupational History  . dentist    Social History Main Topics  . Smoking status: Former Smoker    Quit date: 11/21/1968  . Smokeless tobacco: Never Used     Comment: quit late 70s  . Alcohol use Yes     Comment: socially  . Drug use: No  . Sexual activity: Not on file   Other Topics Concern  . Not on file   Social History Narrative   Household , one child at home       Allergies as of 05/03/2016   No Known Allergies     Medication List       Accurate  as of 05/03/16 11:59 PM. Always use your most recent med list.          aspirin 81 MG tablet Take 81 mg by mouth daily.   atorvastatin 10 MG tablet Commonly known as:  LIPITOR Take 1 tablet (10 mg total) by mouth daily.   cyclobenzaprine 10 MG tablet Commonly known as:  FLEXERIL Take 1 tablet (10 mg total) by mouth at bedtime as needed for muscle spasms.   fish oil-omega-3 fatty acids 1000 MG capsule Take 1 g by mouth daily.   ibuprofen 800 MG tablet Commonly known as:  ADVIL,MOTRIN Take 800 mg by mouth every 8 (eight) hours as needed.   predniSONE 10 MG tablet Commonly known as:  DELTASONE 5 tablets x 2 days, 4 tablets x 2 days, 3 tabs x 2 days, 2 tabs x 2 days, 1 tab x 2 days   Vitamin D 1000 units capsule Take 1,000 Units by mouth daily.          Objective:   Physical Exam  Neck:     BP 124/68 (BP Location: Left Arm, Patient Position: Sitting, Cuff  Size: Normal)   Pulse 64   Temp 98.1 F (36.7 C) (Oral)   Resp 14   Ht 5\' 11"  (1.803 m)   Wt 261 lb 6 oz (118.6 kg)   SpO2 98%   BMI 36.45 kg/m  General:   Well developed, well nourished . NAD.  HEENT:  Normocephalic . Face symmetric, atraumatic Neck: Range of motion slightly limited due to pain. No TTP at the cervical spine. Skin: Not pale. Not jaundice Neurologic:  alert & oriented X3.  Speech normal, gait appropriate for age and unassisted. Motor symmetric. DTRs slightly decreased at the lower extremities but symmetric Psych--  Cognition and judgment appear intact.  Cooperative with normal attention span and concentration.  Behavior appropriate. No anxious or depressed appearing.      Assessment & Plan:    Assessment Elevated BP Hyperlipidemia Elevated LFTs DJD Obesity OSA, + CPAP BCC, Dr Allyson Sabal Ocular rosacea HOH, L   PLAN: Neck pain: Flareup of well known neck pain. He is taking 800 mg ibuprofen, 2-3  (?)  tablets daily. Will prescribe prednisone, Tylenol, minimize ibuprofen use. Has  not use a muscle relaxant, trial with Flexeril, discussed drowsiness. High chol: Decided to stop atorvastatin due to leg cramps, recommend to go back on them next month and see how he does LLQ abd pain: resolved patient thinks he was related to left hip pain. Got a local injection felt better. Did have a colonoscopy a few months ago.

## 2016-05-05 NOTE — Assessment & Plan Note (Signed)
Neck pain: Flareup of well known neck pain. He is taking 800 mg ibuprofen, 2-3  (?)  tablets daily. Will prescribe prednisone, Tylenol, minimize ibuprofen use. Has not use a muscle relaxant, trial with Flexeril, discussed drowsiness. High chol: Decided to stop atorvastatin due to leg cramps, recommend to go back on them next month and see how he does LLQ abd pain: resolved patient thinks he was related to left hip pain. Got a local injection felt better. Did have a colonoscopy a few months ago.

## 2016-05-09 DIAGNOSIS — M542 Cervicalgia: Secondary | ICD-10-CM | POA: Diagnosis not present

## 2016-05-09 DIAGNOSIS — R29898 Other symptoms and signs involving the musculoskeletal system: Secondary | ICD-10-CM | POA: Diagnosis not present

## 2016-05-09 DIAGNOSIS — M50122 Cervical disc disorder at C5-C6 level with radiculopathy: Secondary | ICD-10-CM | POA: Diagnosis not present

## 2016-05-10 DIAGNOSIS — H0289 Other specified disorders of eyelid: Secondary | ICD-10-CM | POA: Diagnosis not present

## 2016-05-10 DIAGNOSIS — H524 Presbyopia: Secondary | ICD-10-CM | POA: Diagnosis not present

## 2016-05-10 DIAGNOSIS — H2513 Age-related nuclear cataract, bilateral: Secondary | ICD-10-CM | POA: Diagnosis not present

## 2016-05-10 DIAGNOSIS — H02832 Dermatochalasis of right lower eyelid: Secondary | ICD-10-CM | POA: Diagnosis not present

## 2016-05-10 DIAGNOSIS — H25041 Posterior subcapsular polar age-related cataract, right eye: Secondary | ICD-10-CM | POA: Diagnosis not present

## 2016-05-10 DIAGNOSIS — H02831 Dermatochalasis of right upper eyelid: Secondary | ICD-10-CM | POA: Diagnosis not present

## 2016-06-21 DIAGNOSIS — M542 Cervicalgia: Secondary | ICD-10-CM | POA: Diagnosis not present

## 2016-08-19 ENCOUNTER — Telehealth: Payer: Self-pay | Admitting: Internal Medicine

## 2016-08-21 NOTE — Telephone Encounter (Signed)
Completed.

## 2016-08-23 ENCOUNTER — Ambulatory Visit (INDEPENDENT_AMBULATORY_CARE_PROVIDER_SITE_OTHER): Payer: Medicare Other | Admitting: Internal Medicine

## 2016-08-23 ENCOUNTER — Encounter: Payer: Self-pay | Admitting: Internal Medicine

## 2016-08-23 VITALS — BP 134/78 | HR 58 | Temp 98.1°F | Resp 14 | Ht 71.0 in | Wt 264.5 lb

## 2016-08-23 DIAGNOSIS — E785 Hyperlipidemia, unspecified: Secondary | ICD-10-CM | POA: Diagnosis not present

## 2016-08-23 DIAGNOSIS — G4733 Obstructive sleep apnea (adult) (pediatric): Secondary | ICD-10-CM | POA: Diagnosis not present

## 2016-08-23 DIAGNOSIS — R030 Elevated blood-pressure reading, without diagnosis of hypertension: Secondary | ICD-10-CM

## 2016-08-23 DIAGNOSIS — R399 Unspecified symptoms and signs involving the genitourinary system: Secondary | ICD-10-CM | POA: Diagnosis not present

## 2016-08-23 LAB — COMPREHENSIVE METABOLIC PANEL
ALT: 84 U/L — AB (ref 0–53)
AST: 48 U/L — ABNORMAL HIGH (ref 0–37)
Albumin: 4.6 g/dL (ref 3.5–5.2)
Alkaline Phosphatase: 48 U/L (ref 39–117)
BILIRUBIN TOTAL: 0.6 mg/dL (ref 0.2–1.2)
BUN: 19 mg/dL (ref 6–23)
CO2: 30 meq/L (ref 19–32)
Calcium: 9.5 mg/dL (ref 8.4–10.5)
Chloride: 107 mEq/L (ref 96–112)
Creatinine, Ser: 1.15 mg/dL (ref 0.40–1.50)
GFR: 67.14 mL/min (ref >60.00)
GLUCOSE: 95 mg/dL (ref 70–99)
POTASSIUM: 5 meq/L (ref 3.5–5.1)
SODIUM: 141 meq/L (ref 135–145)
Total Protein: 6.8 g/dL (ref 6.0–8.3)

## 2016-08-23 LAB — CBC WITH DIFFERENTIAL/PLATELET
BASOS PCT: 1.1 % (ref 0.0–3.0)
Basophils Absolute: 0.1 10*3/uL (ref 0.0–0.1)
EOS PCT: 4.1 % (ref 0.0–5.0)
Eosinophils Absolute: 0.2 10*3/uL (ref 0.0–0.7)
HEMATOCRIT: 46.2 % (ref 39.0–52.0)
Hemoglobin: 15.5 g/dL (ref 13.0–17.0)
LYMPHS ABS: 1.9 10*3/uL (ref 0.7–4.0)
LYMPHS PCT: 32.7 % (ref 12.0–46.0)
MCHC: 33.5 g/dL (ref 30.0–36.0)
MCV: 89.5 fl (ref 78.0–100.0)
MONOS PCT: 8.7 % (ref 3.0–12.0)
Monocytes Absolute: 0.5 10*3/uL (ref 0.1–1.0)
NEUTROS ABS: 3.2 10*3/uL (ref 1.4–7.7)
NEUTROS PCT: 53.4 % (ref 43.0–77.0)
PLATELETS: 159 10*3/uL (ref 150.0–400.0)
RBC: 5.16 Mil/uL (ref 4.22–5.81)
RDW: 13.4 % (ref 11.5–15.5)
WBC: 5.9 10*3/uL (ref 4.0–10.5)

## 2016-08-23 LAB — URINALYSIS, ROUTINE W REFLEX MICROSCOPIC
Bilirubin Urine: NEGATIVE
KETONES UR: NEGATIVE
Leukocytes, UA: NEGATIVE
Nitrite: NEGATIVE
PH: 6 (ref 5.0–8.0)
SPECIFIC GRAVITY, URINE: 1.02 (ref 1.000–1.030)
Total Protein, Urine: NEGATIVE
URINE GLUCOSE: NEGATIVE
Urobilinogen, UA: 0.2 (ref 0.0–1.0)

## 2016-08-23 LAB — LIPID PANEL
Cholesterol: 245 mg/dL — ABNORMAL HIGH (ref 0–200)
HDL: 41.7 mg/dL (ref >39.00)
LDL Cholesterol: 177 mg/dL — ABNORMAL HIGH (ref 0–99)
NonHDL: 203.2
Total CHOL/HDL Ratio: 6
Triglycerides: 131 mg/dL (ref 0.0–149.0)
VLDL: 26.2 mg/dL (ref 0.0–40.0)

## 2016-08-23 LAB — PSA, MEDICARE: PSA: 1.83 ng/mL (ref 0.10–4.00)

## 2016-08-23 NOTE — Progress Notes (Signed)
Pre visit review using our clinic review tool, if applicable. No additional management support is needed unless otherwise documented below in the visit note. 

## 2016-08-23 NOTE — Patient Instructions (Signed)
GO TO THE LAB : Get the blood work     GO TO THE FRONT DESK Schedule your next appointment for a  routine checkup in 6 months  Considered to Medicare wellness exam with one of her nurses.

## 2016-08-23 NOTE — Progress Notes (Signed)
Subjective:    Patient ID: Dale [name], male    DOB: October 06, 1950, 66 y.o.   MRN: [id number]  DOS:  08/23/2016 Type of visit - description : rov, here w/ wife, has concerns  Interval history: Leg cramps: Moderate and sometimes severe leg cramps, with or without Lipitor. when he takes vitamin D cramps improve. Sleep apnea, needs a new machine. LUTS for 2 years, request a urology referral. Reports severe difficulty urinating and in the morning, the rest of the day has mild difficulty. Denies dysuria, gross hematuria but reports significant frequency (urinates about 10 times a day) and occasional severe urgency. He also reports a decreased sensation when he urinates. High cholesterol: Off Lipitor for several months Was seen with neck pain, symptoms improved.  Wt Readings from Last 3 Encounters:  08/23/16 264 lb 8 oz (120 kg)  05/03/16 261 lb 6 oz (118.6 kg)  10/06/15 259 lb 6.4 oz (117.7 kg)     Review of Systems See above  Past Medical History:  Diagnosis Date  . BCC (basal cell carcinoma of skin)    dr Allyson Sabal  . Decreased hearing    Left, saw audiologist in 2011 per pt   . DJD (degenerative joint disease)   . Elevated liver function tests   . Hyperlipidemia    mixed  . Hypertension   . Obesity   . Ocular rosacea    on doxy as of 01-2013  . OSA (obstructive sleep apnea)    on CPAP  . Tubular adenoma of colon 2017    Past Surgical History:  Procedure Laterality Date  . COLONOSCOPY W/ POLYPECTOMY  10/20/2015   tubular adenoma  . TONSILLECTOMY      Social History   Social History  . Marital status: Married    Spouse name: N/A  . Number of children: 3  . Years of education: N/A   Occupational History  . dentist    Social History Main Topics  . Smoking status: Former Smoker    Quit date: 11/21/1968  . Smokeless tobacco: Never Used     Comment: quit late 70s  . Alcohol use Yes     Comment: socially  . Drug use: No  . Sexual activity: Not on file    Other Topics Concern  . Not on file   Social History Narrative   Household , one child at home       Allergies as of 08/23/2016   No Known Allergies     Medication List       Accurate as of 08/23/16 11:59 PM. Always use your most recent med list.          aspirin 81 MG tablet Take 81 mg by mouth daily.   fish oil-omega-3 fatty acids 1000 MG capsule Take 1 g by mouth daily.   ibuprofen 800 MG tablet Commonly known as:  ADVIL,MOTRIN Take 800 mg by mouth every 8 (eight) hours as needed.   Vitamin D 1000 units capsule Take 1,000 Units by mouth daily.          Objective:   Physical Exam BP 134/78 (BP Location: Left Arm, Patient Position: Sitting, Cuff Size: Normal)   Pulse (!) 58   Temp 98.1 F (36.7 C) (Oral)   Resp 14   Ht 5\' 11"  (1.803 m)   Wt 264 lb 8 oz (120 kg)   SpO2 98%   BMI 36.89 kg/m  General:   Well developed, well nourished . NAD.  Neck: No  thyromegaly  HEENT:  Normocephalic . Face symmetric, atraumatic Lungs:  CTA B Normal respiratory effort, no intercostal retractions, no accessory muscle use. Heart: RRR,  no murmur.  No pretibial edema bilaterally  Abdomen:  Not distended, soft, non-tender. No rebound or rigidity.   Skin: Exposed areas without rash. Not pale. Not jaundice DRE: Normal sphincter tone, brown stools. Prostate is a slightly enlarged, slightly more on the right, nontender to palpation, not nodular, slightly more firm that what I was  expecting Neurologic:  alert & oriented X3.  Speech normal, gait appropriate for age and unassisted Strength symmetric and appropriate for age.  Psych: Cognition and judgment appear intact.  Cooperative with normal attention span and concentration.  Behavior appropriate. No anxious or depressed appearing.    Assessment & Plan:   Assessment Elevated BP Hyperlipidemia Elevated LFTs DJD Morbid obesity: BMI 36 + OSA OSA, + CPAP BCC, Dr Allyson Sabal Ocular rosacea HOH, L   PLAN: Elevated  BP: BP today is very good, check a CMP and CBC Hyperlipidemia: Stopped Lipitor many months ago, has leg cramps but they seem to be unrelated to statins. Check labs, consider restart Lipitor. L UTS: See comments under physical exam. Patient is being referred to urology OSA: His CPAP is more than 68 years old, refer to neurology for reevaluation and hopefully get a new CPAP. Morbid obesity: BMI 36, h/o OSA. Diet and exercise discussed. Rec to see a bariatric doctor Leg cramps: Seem unrelated to statins, see HPI. Better with vitamin D. RTC 6 months

## 2016-08-23 NOTE — Assessment & Plan Note (Addendum)
Discussed shingrex Had a colonoscopy 09-2015, no actual report, was told normal. Prostate cancer screening: Has LUTS, DRE slightly abnormal, see physical exam. Will get a PSA, UA, urine culture, refer to urology

## 2016-08-24 LAB — URINE CULTURE: ORGANISM ID, BACTERIA: NO GROWTH

## 2016-08-26 NOTE — Assessment & Plan Note (Signed)
Elevated BP: BP today is very good, check a CMP and CBC Hyperlipidemia: Stopped Lipitor many months ago, has leg cramps but they seem to be unrelated to statins. Check labs, consider restart Lipitor. L UTS: See comments under physical exam. Patient is being referred to urology OSA: His CPAP is more than 68 years old, refer to neurology for reevaluation and hopefully get a new CPAP. Morbid obesity: BMI 36, h/o OSA. Diet and exercise discussed. Rec to see a bariatric doctor Leg cramps: Seem unrelated to statins, see HPI. Better with vitamin D. RTC 6 months

## 2016-08-27 MED ORDER — ATORVASTATIN CALCIUM 10 MG PO TABS: 10.0000 mg | ORAL_TABLET | Freq: Every day | ORAL | 2 refills | Status: DC

## 2016-08-27 NOTE — Addendum Note (Signed)
Addended byDamita Dunnings D on: 08/27/2016 10:12 AM   Modules accepted: Orders

## 2016-10-17 ENCOUNTER — Institutional Professional Consult (permissible substitution): Payer: Self-pay | Admitting: Neurology

## 2016-10-18 DIAGNOSIS — R3915 Urgency of urination: Secondary | ICD-10-CM | POA: Diagnosis not present

## 2016-10-18 DIAGNOSIS — N401 Enlarged prostate with lower urinary tract symptoms: Secondary | ICD-10-CM | POA: Diagnosis not present

## 2016-10-18 DIAGNOSIS — R350 Frequency of micturition: Secondary | ICD-10-CM | POA: Diagnosis not present

## 2016-10-18 DIAGNOSIS — Z8042 Family history of malignant neoplasm of prostate: Secondary | ICD-10-CM | POA: Diagnosis not present

## 2016-11-14 ENCOUNTER — Ambulatory Visit (INDEPENDENT_AMBULATORY_CARE_PROVIDER_SITE_OTHER): Payer: Medicare Other | Admitting: Neurology

## 2016-11-14 ENCOUNTER — Encounter: Payer: Self-pay | Admitting: Neurology

## 2016-11-14 VITALS — BP 152/72 | HR 64 | Ht 70.5 in | Wt 270.0 lb

## 2016-11-14 DIAGNOSIS — G4733 Obstructive sleep apnea (adult) (pediatric): Secondary | ICD-10-CM | POA: Diagnosis not present

## 2016-11-14 DIAGNOSIS — M542 Cervicalgia: Secondary | ICD-10-CM | POA: Diagnosis not present

## 2016-11-14 DIAGNOSIS — R635 Abnormal weight gain: Secondary | ICD-10-CM

## 2016-11-14 DIAGNOSIS — Z9989 Dependence on other enabling machines and devices: Secondary | ICD-10-CM

## 2016-11-14 DIAGNOSIS — E669 Obesity, unspecified: Secondary | ICD-10-CM

## 2016-11-14 DIAGNOSIS — G4719 Other hypersomnia: Secondary | ICD-10-CM

## 2016-11-14 NOTE — Progress Notes (Signed)
Subjective:    Patient ID: Dale [name] is a 66 y.o. male.  HPI     Dale Age, MD, PhD [facility] [address], [address] [place], [place] [address]  Dear Dr. Larose Kells,   I saw your patient, Dale [name], upon your kind request in my neurologic clinic today for initial consultation of his sleep disorder, in particular, reevaluation of prior diagnosis of OSA. 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 His sleep study was over 10 years ago, prior study results are not available for my review today. A CPAP download was not available for my review today as the machine is older. I reviewed your office note from 08/23/2016. His Epworth sleepiness score is 7 out of 24, fatigue score is 10 out of 63. He lives with his wife and son. He has 3 children. He works as a Pharmacist, community. He quit smoking in 1979 and drinks alcohol about 3 times a week and reports drinking caffeine "a lot": multiple cups of coffee per day. His wife reports that he is not necessarily all that well rested, when he first started on CPAP some 13 years ago he had a great response to it, felt a big difference in his daytime energy. He does report an approximately 30 pound weight gain since his original diagnosis. He has 2 daughters, ages 69 and 43, their 96 year old son lives with them. He had issues with leg cramps for a while. He temporarily stopped his atorvastatin at the time but has been able to restart it. Denies FHx of OSA and has no Sx of RLS. He has no night to night nocturia and denies morning headaches.  His Past Medical History Is Significant For: Past Medical  History:  Diagnosis Date  . BCC (basal cell carcinoma of skin)    dr Allyson Sabal  . Decreased hearing    Left, saw audiologist in 2011 per pt   . DJD (degenerative joint disease)   . Elevated liver function tests   . Hyperlipidemia    mixed  . Hypertension   . Obesity   . Ocular rosacea    on doxy as of 01-2013  . OSA (obstructive sleep apnea)    on CPAP  . Tubular adenoma of colon 2017    His Past Surgical History Is Significant For: Past Surgical History:  Procedure Laterality Date  . COLONOSCOPY W/ POLYPECTOMY  10/20/2015   tubular adenoma  . TONSILLECTOMY      His Family History Is Significant For: Family History  Problem Relation [name] of Onset  . Prostate cancer Father 53       [name] 99s died at 44, also an UNCLE  . Coronary artery disease Paternal Grandmother   . Diabetes Mother   . Hypertension Mother   . Heart failure Maternal Grandmother   . Esophageal cancer Paternal Grandfather   . Colon cancer Neg Hx   . Stroke Neg Hx     His Social History Is Significant For: Social History   Social History  . Marital status: Married    Spouse name: N/A  . Number of children: 3  . Years of education: N/A   Occupational History  . dentist    Social History Main Topics  .  Smoking status: Former Smoker    Quit date: 11/21/1968  . Smokeless tobacco: Never Used     Comment: quit late 70s  . Alcohol use Yes     Comment: socially  . Drug use: No  . Sexual activity: Not Asked   Other Topics Concern  . None   Social History Narrative   Household , one child at home     His Allergies Are:  No Known Allergies:   His Current Medications Are:  Outpatient Encounter Prescriptions as of 11/14/2016  Medication Sig  . aspirin 81 MG tablet Take 81 mg by mouth daily.    Marland Kitchen atorvastatin (LIPITOR) 10 MG tablet Take 1 tablet (10 mg total) by mouth at bedtime.  . Cholecalciferol (VITAMIN D) 1000 UNITS capsule Take 1,000 Units by mouth daily.    . fish oil-omega-3 fatty acids  1000 MG capsule Take 1 g by mouth daily.   Marland Kitchen ibuprofen (ADVIL,MOTRIN) 800 MG tablet Take 800 mg by mouth every 8 (eight) hours as needed.  . tamsulosin (FLOMAX) 0.4 MG CAPS capsule Take 0.4 mg by mouth daily.   No facility-administered encounter medications on file as of 11/14/2016.   :  Review of Systems:  Out of a complete 14 point review of systems, all are reviewed and negative with the exception of these symptoms as listed below: Review of Systems  Neurological:       Pt presents today to discuss his cpap. Pt needs a new cpap. Pt's current cpap is unable to be downloaded in the office.  Epworth Sleepiness Scale 0= would never doze 1= slight chance of dozing 2= moderate chance of dozing 3= high chance of dozing  Sitting and reading: 2 Watching TV: 2 Sitting inactive in a public place (ex. Theater or meeting): 0 As a passenger in a car for an hour without a break: 0 Lying down to rest in the afternoon: 3 Sitting and talking to someone: 0 Sitting quietly after lunch (no alcohol): 0 In a car, while stopped in traffic: 0 Total: 7     Objective:  Neurological Exam  Physical Exam Physical Examination:   Vitals:   11/14/16 1559  BP: (!) 152/72  Pulse: 64    General Examination: The patient is a very pleasant 66 y.o. male in no acute distress. He appears well-developed and well-nourished and well groomed.   HEENT: Normocephalic, atraumatic, pupils are equal, round and reactive to light and accommodation. Funduscopic exam is normal with sharp disc margins noted. Extraocular tracking is good without limitation to gaze excursion or nystagmus noted. Normal smooth pursuit is noted. Hearing is grossly intact. Tympanic membranes are clear bilaterally. Face is symmetric with normal facial animation and normal facial sensation. Speech is clear with no dysarthria noted. There is no hypophonia. There is no lip, neck/head, jaw or voice tremor. Neck is supple with full range of passive  and active motion. There are no carotid bruits on auscultation. Oropharynx exam reveals: mild mouth dryness, adequate dental hygiene and mild airway crowding, due to smaller airway entry and longish uvula. Mallampati is class II. Tongue protrudes centrally and palate elevates symmetrically. Tonsils are absent. Neck size is 18 3/8 inches.   Chest: Clear to auscultation without wheezing, rhonchi or crackles noted.  Heart: S1+S2+0, regular and normal without murmurs, rubs or gallops noted.   Abdomen: Soft, non-tender and non-distended with normal bowel sounds appreciated on auscultation.  Extremities: There is no pitting edema in the distal lower extremities bilaterally. Pedal pulses are  intact.  Skin: Warm and dry without trophic changes noted. Rosacea around nose.   Musculoskeletal: exam reveals no obvious joint deformities, tenderness or joint swelling or erythema.   Neurologically:  Mental status: The patient is awake, alert and oriented in all 4 spheres. His immediate and remote memory, attention, language skills and fund of knowledge are appropriate. There is no evidence of aphasia, agnosia, apraxia or anomia. Speech is clear with normal prosody and enunciation. Thought process is linear. Mood is normal and affect is normal.  Cranial nerves II - XII are as described above under HEENT exam. In addition: shoulder shrug is normal with equal shoulder height noted. Motor exam: Normal bulk, strength and tone is noted. There is no drift, tremor or rebound. Romberg is negative. Reflexes are 2+ throughout. Fine motor skills and coordination: intact with normal finger taps, normal hand movements, normal rapid alternating patting, normal foot taps and normal foot agility.  Cerebellar testing: No dysmetria or intention tremor on finger to nose testing. Heel to shin is unremarkable bilaterally. There is no truncal or gait ataxia.  Sensory exam: intact to light touch in the upper and lower extremities.   Gait, station and balance: He stands easily. No veering to one side is noted. No leaning to one side is noted. Posture is [name]-appropriate and stance is narrow based. Gait shows normal stride length and normal pace. No problems turning are noted.               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 He has been on CPAP therapy and indicates compliance with treatment but also some reemergence of symptoms including nonrestorative sleep at times and sleep disruption. He may have some decrease in daytime energy with time. His wife feels that it may not treat his sleep apnea appropriately at this time. He certainly would benefit from reevaluation, particularly in light of his overall weight gain of approximately 30 pounds per his estimation in the past years. He believes his machine is around 68 years old. He would need a new machine and new supplies. I suggested we proceed with sleep study testing for reevaluation and optimization of his treatment and updating his equipment. I had a long chat with the patient and his wife about my findings and the diagnosis of OSA, its prognosis and treatment options. We talked about medical treatments, surgical interventions and non-pharmacological approaches. I explained in particular the risks and ramifications of untreated moderate to severe OSA, especially with respect to developing cardiovascular disease down the Road, including congestive heart failure, difficult to treat hypertension, cardiac arrhythmias, or stroke. Even type 2 diabetes has, in part, been linked to untreated OSA. Symptoms of untreated OSA include daytime sleepiness,  memory problems, mood irritability and mood disorder such as depression and anxiety, lack of energy, as well as recurrent headaches, especially morning headaches. We talked about trying to maintain a healthy lifestyle in general, as well as the importance of weight control. I encouraged the patient to eat healthy, exercise daily and keep well hydrated, to keep a scheduled bedtime and wake time routine, to not skip any meals and eat healthy snacks in between meals. I advised the patient not to drive when  feeling sleepy. I recommended the following at this time: sleep study with potential positive airway pressure titration. (We will score hypopneas at 4%).   I explained the sleep test procedure to the patient and also outlined possible surgical and non-surgical treatment options of OSA, including the use of a custom-made dental device (which would require a referral to a specialist dentist or oral surgeon), upper airway surgical options, such as pillar implants, radiofrequency surgery, tongue base surgery, and UPPP (which would involve a referral to an ENT surgeon). Rarely, jaw surgery such as mandibular advancement may be considered.  I also explained the CPAP treatment option to the patient, who indicated that he would be willing to continue with CPAP if the need arises. I explained the importance of being compliant with PAP treatment, not only for insurance purposes but primarily to improve His symptoms, and for the patient's long term health benefit, including to reduce His cardiovascular risks. I answered all their questions today and the patient and his wife were in agreement. I would like to see him back after the sleep study is completed and encouraged him to call with any interim questions, concerns, problems or updates.   Thank you very much for allowing me to participate in the care of this nice patient. If I can be of any further assistance to you please do not hesitate to call me at  [phone].  Sincerely,   Dale Age, MD, PhD

## 2016-11-14 NOTE — Patient Instructions (Signed)
Thank you for choosing [facility] for your sleep related care! It was nice to meet you today! I appreciate that you entrust me with your sleep related healthcare concerns. I hope, I was able to address at least some of your concerns today, and that I can help you feel reassured and also get better.    Here is what we discussed today and what we came up with as our plan for you:    Based on your symptoms and your exam I believe you are still at risk for obstructive sleep apnea or OSA, and I think we should proceed with a sleep study to determine how severe it is. If you have more than mild OSA, I want you to consider treatment with CPAP. Please remember, the risks and ramifications of moderate to severe obstructive sleep apnea or OSA are: Cardiovascular disease, including congestive heart failure, stroke, difficult to control hypertension, arrhythmias, and even type 2 diabetes has been linked to untreated OSA. Sleep apnea causes disruption of sleep and sleep deprivation in most cases, which, in turn, can cause recurrent headaches, problems with memory, mood, concentration, focus, and vigilance. Most people with untreated sleep apnea report excessive daytime sleepiness, which can affect their ability to drive. Please do not drive if you feel sleepy.   I will likely see you back after your sleep study to go over the test results and where to go from there. We will call you after your sleep study to advise about the results (most likely, you will hear from [place], my nurse) and to set up an appointment at the time, as necessary.    Our sleep lab administrative assistant, Arrie Aran will call you to schedule your sleep study. If you don't hear back from her by about 2 weeks from now, please feel free to call her at [phone]. You can leave a message with your phone number and concerns, if you get the voicemail box. She will call back as soon as possible.

## 2016-11-15 DIAGNOSIS — R350 Frequency of micturition: Secondary | ICD-10-CM | POA: Diagnosis not present

## 2016-11-15 DIAGNOSIS — R3915 Urgency of urination: Secondary | ICD-10-CM | POA: Diagnosis not present

## 2016-11-15 DIAGNOSIS — N401 Enlarged prostate with lower urinary tract symptoms: Secondary | ICD-10-CM | POA: Diagnosis not present

## 2016-11-15 DIAGNOSIS — Z8042 Family history of malignant neoplasm of prostate: Secondary | ICD-10-CM | POA: Diagnosis not present

## 2016-11-28 DIAGNOSIS — D485 Neoplasm of uncertain behavior of skin: Secondary | ICD-10-CM | POA: Diagnosis not present

## 2016-12-05 ENCOUNTER — Encounter: Payer: Self-pay | Admitting: Internal Medicine

## 2016-12-27 ENCOUNTER — Ambulatory Visit (INDEPENDENT_AMBULATORY_CARE_PROVIDER_SITE_OTHER): Payer: Medicare Other

## 2016-12-27 DIAGNOSIS — Z23 Encounter for immunization: Secondary | ICD-10-CM

## 2017-01-02 ENCOUNTER — Other Ambulatory Visit: Payer: Self-pay | Admitting: Internal Medicine

## 2017-01-23 ENCOUNTER — Ambulatory Visit (INDEPENDENT_AMBULATORY_CARE_PROVIDER_SITE_OTHER): Payer: Medicare Other | Admitting: Neurology

## 2017-01-23 DIAGNOSIS — G4719 Other hypersomnia: Secondary | ICD-10-CM

## 2017-01-23 DIAGNOSIS — R635 Abnormal weight gain: Secondary | ICD-10-CM

## 2017-01-23 DIAGNOSIS — Z9989 Dependence on other enabling machines and devices: Secondary | ICD-10-CM

## 2017-01-23 DIAGNOSIS — G4720 Circadian rhythm sleep disorder, unspecified type: Secondary | ICD-10-CM

## 2017-01-23 DIAGNOSIS — G4733 Obstructive sleep apnea (adult) (pediatric): Secondary | ICD-10-CM | POA: Diagnosis not present

## 2017-01-23 DIAGNOSIS — M542 Cervicalgia: Secondary | ICD-10-CM

## 2017-01-23 DIAGNOSIS — E669 Obesity, unspecified: Secondary | ICD-10-CM

## 2017-01-30 DIAGNOSIS — D225 Melanocytic nevi of trunk: Secondary | ICD-10-CM | POA: Diagnosis not present

## 2017-01-30 DIAGNOSIS — L814 Other melanin hyperpigmentation: Secondary | ICD-10-CM | POA: Diagnosis not present

## 2017-01-30 DIAGNOSIS — L821 Other seborrheic keratosis: Secondary | ICD-10-CM | POA: Diagnosis not present

## 2017-01-30 DIAGNOSIS — Z85828 Personal history of other malignant neoplasm of skin: Secondary | ICD-10-CM | POA: Diagnosis not present

## 2017-01-30 DIAGNOSIS — D1801 Hemangioma of skin and subcutaneous tissue: Secondary | ICD-10-CM | POA: Diagnosis not present

## 2017-01-31 ENCOUNTER — Other Ambulatory Visit: Payer: Self-pay | Admitting: Neurology

## 2017-01-31 DIAGNOSIS — G4733 Obstructive sleep apnea (adult) (pediatric): Secondary | ICD-10-CM

## 2017-01-31 NOTE — Procedures (Signed)
PATIENT'S NAME:  Dale [name], Dale [name] DOB:      06-May-1950      MR#:    [id number]     DATE OF RECORDING: 01/23/2017 REFERRING M.D.:  Kathlene November, MD Study Performed:   Baseline Polysomnogram HISTORY: 67 year old man with a history of hypertension, obesity, degenerative joint disease, elevated liver enzymes, hyperlipidemia, rosacea, and colonic polyps, who was previously diagnosed with obstructive sleep apnea and placed on CPAP therapy. He presents for re-evaluation. The patient endorsed the Epworth Sleepiness Scale at 7 points. The patient's weight 269 pounds with a height of 70 (inches), resulting in a BMI of 38.5 kg/m2. The patient's neck circumference measured 18.5 inches.  CURRENT MEDICATIONS: Aspirin, Lipitor, Advil, Flomax   PROCEDURE:  This is a multichannel digital polysomnogram utilizing the Somnostar 11.2 system.  Electrodes and sensors were applied and monitored per AASM Specifications.   EEG, EOG, Chin and Limb EMG, were sampled at 200 Hz.  ECG, Snore and Nasal Pressure, Thermal Airflow, Respiratory Effort, CPAP Flow and Pressure, Oximetry was sampled at 50 Hz. Digital video and audio were recorded.      BASELINE STUDY  Lights Out was at 22:30 and Lights On at 04:57.  Total recording time (TRT) was 387.5 minutes, with a total sleep time (TST) of  310.5 minutes.   The patient's sleep latency was 5.5 minutes.  REM latency was 147 minutes, which is delayed. The sleep efficiency was 80.1 %.     SLEEP ARCHITECTURE: WASO (Wake after sleep onset) was 64 minutes with mild to moderate sleep fragmentation noted.  There were 54 minutes in Stage N1, 93.5 minutes Stage N2, 144 minutes Stage N3 and 19 minutes in Stage REM.  The percentage of Stage N1 was 17.4%, which is increased, Stage N2 was 30.1%, Stage N3 was 46.4%, which is increased, and Stage R (REM sleep) was 6.1%, which is reduced. The arousals were noted as: 31 were spontaneous, 0 were associated with PLMs, 24 were associated with respiratory  events.  Audio and video analysis did not show any abnormal or unusual movements, behaviors, phonations or vocalizations. The patient took no bathroom breaks. Mild to moderate snoring was noted. The EKG was in keeping with normal sinus rhythm (NSR).  RESPIRATORY ANALYSIS:  There were a total of 65 respiratory events:  1 obstructive apneas, 57 central apneas and 0 mixed apneas with a total of 58 apneas and an apnea index (AI) of 11.2 /hour. There were 7 hypopneas with a hypopnea index of 1.4 /hour. The patient also had 0 respiratory event related arousals (RERAs).      The total APNEA/HYPOPNEA INDEX (AHI) was 12.6/hour and the total RESPIRATORY DISTURBANCE INDEX was 12.6 /hour.  25 events occurred in REM sleep and 46 events in NREM. The REM AHI was 78.9 /hour, versus a non-REM AHI of 8.2. The patient spent 7 minutes of total sleep time in the supine position and 304 minutes in non-supine.. The supine AHI was 0.0 versus a non-supine AHI of 12.9.  OXYGEN SATURATION & C02:  The Wake baseline 02 saturation was 96%, with the lowest being 83%. Time spent below 89% saturation equaled 22 minutes.  PERIODIC LIMB MOVEMENTS: The patient had a total of 0 Periodic Limb Movements.  The Periodic Limb Movement (PLM) index was 0 and the PLM Arousal index was 0/hour.  Post-study, the patient indicated that sleep was worse than usual.   IMPRESSION:  1. Obstructive Sleep Apnea (OSA) 2. Dysfunctions associated with sleep stages or arousal from sleep  RECOMMENDATIONS:  1. This study demonstrates overall mild obstructive sleep apnea, severe in REM sleep with a total AHI of 12.6/hour, REM AHI of 78.9/hour, and O2 nadir of 83%. Given the patient's medical history and sleep related complaints, a full-night CPAP titration study is recommended to optimize therapy. Other treatment options may include avoidance of supine sleep position along with weight loss, upper airway or jaw surgery in selected patients or the use of an  oral appliance in certain patients. ENT evaluation and/or consultation with a maxillofacial surgeon or dentist may be feasible in some instances.  2. Please note that untreated obstructive sleep apnea carries additional perioperative morbidity. Patients with significant obstructive sleep apnea should receive perioperative PAP therapy and the surgeons and particularly the anesthesiologist should be informed of the diagnosis and the severity of the sleep disordered breathing. 3. This study shows sleep fragmentation and abnormal sleep stage percentages; these are nonspecific findings and per se do not signify an intrinsic sleep disorder or a cause for the patient's sleep-related symptoms. Causes include (but are not limited to) the first night effect of the sleep study, circadian rhythm disturbances, medication effect or an underlying mood disorder or medical problem.  4. The patient should be cautioned not to drive, work at heights, or operate dangerous or heavy equipment when tired or sleepy. Review and reiteration of good sleep hygiene measures should be pursued with any patient. 5. The patient will be seen in follow-up by Dr. Rexene Alberts at [facility] for discussion of the test results and further management strategies. The referring provider will be notified of the test results.  I certify that I have reviewed the entire raw data recording prior to the issuance of this report in accordance with the Standards of Accreditation of the American Academy of Sleep Medicine (AASM)   [name], MD, PhD Diplomat, American Board of Psychiatry and Neurology (Neurology and Sleep Medicine)

## 2017-01-31 NOTE — Progress Notes (Signed)
Patient referred by Dr. Larose Kells, seen by me on 11/14/16, diagnostic PSG on 01/23/17.    Please call and notify the patient that the recent sleep study did confirm the diagnosis of obstructive sleep apnea and that I recommend treatment he return for a titration study for proper titration and mask fitting. Please explain to patient and arrange for a CPAP titration study. I have placed an order in the chart. Thanks.  [name], MD, PhD [facility])

## 2017-02-03 ENCOUNTER — Telehealth: Payer: Self-pay

## 2017-02-03 NOTE — Telephone Encounter (Signed)
-----   Message from [name], MD sent at 01/31/2017 12:28 PM EST ----- Patient referred by Dr. Larose Kells, seen by me on 11/14/16, diagnostic PSG on 01/23/17.    Please call and notify the patient that the recent sleep study did confirm the diagnosis of obstructive sleep apnea and that I recommend treatment he return for a titration study for proper titration and mask fitting. Please explain to patient and arrange for a CPAP titration study. I have placed an order in the chart. Thanks.  [name], MD, PhD [facility])

## 2017-02-03 NOTE — Telephone Encounter (Signed)
I called pt to discuss his sleep study results. No answer, left a message asking him to call me back. 

## 2017-02-06 NOTE — Telephone Encounter (Signed)
I called pt. I advised pt that Dr. Rexene Alberts reviewed their sleep study results and found that pt does have sleep apnea and recommends that pt be treated with a cpap. Dr. Rexene Alberts recommends that pt return for a repeat sleep study in order to properly titrate the cpap and ensure a good mask fit. Pt is agreeable to returning for a titration study. I advised pt that our sleep lab will file with pt's insurance and call pt to schedule the sleep study when we hear back from the pt's insurance regarding coverage of this sleep study. Pt verbalized understanding of results. Pt had no questions at this time but was encouraged to call back if questions arise.

## 2017-02-06 NOTE — Telephone Encounter (Signed)
Pt is returning call and would like a call back at [phone]

## 2017-02-19 ENCOUNTER — Telehealth: Payer: Self-pay | Admitting: Neurology

## 2017-02-19 NOTE — Telephone Encounter (Signed)
We have attempted to call the patient two times to schedule sleep study. Patient has been unavailable at the phone numbers we have on file, and has not returned our calls. At this time, we will send a letter asking patient to please contact the sleep lab.  °

## 2017-02-28 ENCOUNTER — Encounter: Payer: Self-pay | Admitting: Internal Medicine

## 2017-02-28 ENCOUNTER — Ambulatory Visit (INDEPENDENT_AMBULATORY_CARE_PROVIDER_SITE_OTHER): Payer: Medicare Other | Admitting: Internal Medicine

## 2017-02-28 VITALS — BP 146/86 | HR 51 | Temp 97.8°F | Ht 70.5 in | Wt 260.4 lb

## 2017-02-28 DIAGNOSIS — R399 Unspecified symptoms and signs involving the genitourinary system: Secondary | ICD-10-CM | POA: Diagnosis not present

## 2017-02-28 DIAGNOSIS — E785 Hyperlipidemia, unspecified: Secondary | ICD-10-CM | POA: Diagnosis not present

## 2017-02-28 LAB — LIPID PANEL
CHOLESTEROL: 141 mg/dL (ref 0–200)
HDL: 37.7 mg/dL — ABNORMAL LOW (ref >39.00)
LDL CALC: 90 mg/dL (ref 0–99)
NonHDL: 103.75
Total CHOL/HDL Ratio: 4
Triglycerides: 68 mg/dL (ref 0.0–149.0)
VLDL: 13.6 mg/dL (ref 0.0–40.0)

## 2017-02-28 LAB — COMPREHENSIVE METABOLIC PANEL
ALBUMIN: 4.3 g/dL (ref 3.5–5.2)
ALK PHOS: 49 U/L (ref 39–117)
ALT: 57 U/L — AB (ref 0–53)
AST: 37 U/L (ref 0–37)
BILIRUBIN TOTAL: 0.5 mg/dL (ref 0.2–1.2)
BUN: 19 mg/dL (ref 6–23)
CO2: 29 mEq/L (ref 19–32)
CREATININE: 1.09 mg/dL (ref 0.40–1.50)
Calcium: 9.3 mg/dL (ref 8.4–10.5)
Chloride: 108 mEq/L (ref 96–112)
GFR: 71.32 mL/min (ref >60.00)
Glucose, Bld: 99 mg/dL (ref 70–99)
Potassium: 4.7 mEq/L (ref 3.5–5.1)
SODIUM: 141 meq/L (ref 135–145)
Total Protein: 6.8 g/dL (ref 6.0–8.3)

## 2017-02-28 NOTE — Patient Instructions (Signed)
GO TO THE LAB : Get the blood work     GO TO THE FRONT DESK Schedule your next appointment for a  Physical in 6 months , fasting   Check the  blood pressure 2   times a  Week  Be sure your blood pressure is between 110/65 and  135/85. If it is consistently higher or lower, let me know

## 2017-02-28 NOTE — Progress Notes (Signed)
Subjective:    Patient ID: Dale [name], male    DOB: Sep 17, 1950, 67 y.o.   MRN: [id number]  DOS:  02/28/2017 Type of visit - description : Routine visit Interval history: Here with his wife High cholesterol: Since the last visit he started Lipitor, no apparent side effects. Elevated BP: BP was noted to be elevated, no ambulatory BPs. Upper back pain, L, on and off  for years. L UTS: On Flomax, overall it has help, "way better". OSA: Good compliance with CPAP, wife reported that the mask sometimes leaks   BP Readings from Last 3 Encounters:  02/28/17 (!) 146/86  11/14/16 (!) 152/72  08/23/16 134/78     Review of Systems No CP-SOB No headaches Diet, room for improvement, eats fast foods frequently.  Past Medical History:  Diagnosis Date  . BCC (basal cell carcinoma of skin)    dr Allyson Sabal  . Decreased hearing    Left, saw audiologist in 2011 per pt   . DJD (degenerative joint disease)   . Elevated liver function tests   . Hyperlipidemia    mixed  . Hypertension   . Obesity   . Ocular rosacea    on doxy as of 01-2013  . OSA (obstructive sleep apnea)    on CPAP  . Tubular adenoma of colon 2017    Past Surgical History:  Procedure Laterality Date  . COLONOSCOPY W/ POLYPECTOMY  10/20/2015   tubular adenoma  . TONSILLECTOMY      Social History   Socioeconomic History  . Marital status: Married    Spouse name: Not on file  . Number of children: 3  . Years of education: Not on file  . Highest education level: Not on file  Social Needs  . Financial resource strain: Not on file  . Food insecurity - worry: Not on file  . Food insecurity - inability: Not on file  . Transportation needs - medical: Not on file  . Transportation needs - non-medical: Not on file  Occupational History  . Occupation: dentist  Tobacco Use  . Smoking status: Former Smoker    Last attempt to quit: 11/21/1968    Years since quitting: 48.3  . Smokeless tobacco: Never Used  .  Tobacco comment: quit late 70s  Substance and Sexual Activity  . Alcohol use: Yes    Comment: socially  . Drug use: No  . Sexual activity: Not on file  Other Topics Concern  . Not on file  Social History Narrative   Household , one child at home       Allergies as of 02/28/2017   No Known Allergies     Medication List        Accurate as of 02/28/17 11:59 PM. Always use your most recent med list.          aspirin 81 MG tablet Take 81 mg by mouth daily.   atorvastatin 10 MG tablet Commonly known as:  LIPITOR Take 1 tablet (10 mg total) by mouth at bedtime.   fish oil-omega-3 fatty acids 1000 MG capsule Take 1 g by mouth daily.   ibuprofen 800 MG tablet Commonly known as:  ADVIL,MOTRIN Take 800 mg by mouth every 8 (eight) hours as needed.   tamsulosin 0.4 MG Caps capsule Commonly known as:  FLOMAX Take 0.4 mg by mouth daily.   Vitamin D 1000 units capsule Take 1,000 Units by mouth daily.          Objective:   Physical  Exam BP (!) 146/86   Pulse (!) 51   Temp 97.8 F (36.6 C) (Oral)   Ht 5' 10.5" (1.791 m)   Wt 260 lb 6.4 oz (118.1 kg)   SpO2 98%   BMI 36.84 kg/m  General:   Well developed, overweight appearing . NAD.  HEENT:  Normocephalic . Face symmetric, atraumatic Lungs:  CTA B Normal respiratory effort, no intercostal retractions, no accessory muscle use. Heart: RRR,  no murmur.  No pretibial edema bilaterally  Skin: Not pale. Not jaundice Neurologic:  alert & oriented X3.  Speech normal, gait appropriate for age and unassisted Psych--  Cognition and judgment appear intact.  Cooperative with normal attention span and concentration.  Behavior appropriate. No anxious or depressed appearing.      Assessment & Plan:   Assessment Elevated BP Hyperlipidemia Elevated LFTs DJD Morbid obesity: BMI 36 + OSA OSA, + CPAP BCC, Dr Allyson Sabal Ocular rosacea HOH, L   PLAN: Elevated BP: BP continues to be moderately elevated, we agree on  lifestyle management before we call for medications.  Recommend a low-salt diet, avoid fast foods, walks daily.  Monitor BPs twice a week and call if elevated.  Medication? Hyperlipidemia: Back on Lipitor, check a FLP, CMP. Trapezoid pain, left: Stretching discussed, recommend to do that regularly. L UTS: On Flomax, sxs decreased. Had a flu shot RTC 6 months CPX

## 2017-03-01 NOTE — Assessment & Plan Note (Signed)
Elevated BP: BP continues to be moderately elevated, we agree on lifestyle management before we call for medications.  Recommend a low-salt diet, avoid fast foods, walks daily.  Monitor BPs twice a week and call if elevated.  Medication? Hyperlipidemia: Back on Lipitor, check a FLP, CMP. Trapezoid pain, left: Stretching discussed, recommend to do that regularly. L UTS: On Flomax, sxs decreased. Had a flu shot RTC 6 months CPX

## 2017-03-27 ENCOUNTER — Ambulatory Visit (INDEPENDENT_AMBULATORY_CARE_PROVIDER_SITE_OTHER): Payer: Medicare Other | Admitting: Neurology

## 2017-03-27 DIAGNOSIS — G4733 Obstructive sleep apnea (adult) (pediatric): Secondary | ICD-10-CM | POA: Diagnosis not present

## 2017-03-31 ENCOUNTER — Other Ambulatory Visit: Payer: Self-pay | Admitting: Neurology

## 2017-03-31 DIAGNOSIS — G4733 Obstructive sleep apnea (adult) (pediatric): Secondary | ICD-10-CM

## 2017-03-31 NOTE — Procedures (Signed)
PATIENT'S NAME:  Dale [name], Dale [name] DOB:      [birth year]      MR#:    [id number]     DATE OF RECORDING: 03/27/2017 REFERRING M.D.:  Kathlene November, MD Study Performed:   CPAP  Titration HISTORY: 67 year old man with a history of hypertension, obesity, degenerative joint disease, elevated liver enzymes, hyperlipidemia, rosacea, and colonic polyps, who returns for a CPAP titration. His baseline sleep study from 01/23/17 showed mild obstructive sleep apnea, severe in REM sleep with a total AHI of 12.6/hour, REM AHI of 78.9/hour, and O2 nadir of 83%. The patient's weight 269 pounds with a height of 70 (inches), resulting in a BMI of 38.5 kg/m2. The patient's neck circumference measured 18 inches.  CURRENT MEDICATIONS: Aspirin, Lipitor, Advil, Flomax  PROCEDURE:  This is a multichannel digital polysomnogram utilizing the SomnoStar 11.2 system.  Electrodes and sensors were applied and monitored per AASM Specifications.   EEG, EOG, Chin and Limb EMG, were sampled at 200 Hz.  ECG, Snore and Nasal Pressure, Thermal Airflow, Respiratory Effort, CPAP Flow and Pressure, Oximetry was sampled at 50 Hz. Digital video and audio were recorded.      The patient was fitted with a small Simplus FFM. CPAP was initiated at 5 cmH20 with heated humidity per AASM standards and pressure was advanced to 9 cmH20 because of hypopneas, apneas and desaturations.  At a PAP pressure of 9 cmH20, there was a reduction of the AHI to 0/hour with brief supine REM sleep achieved and O2 nadir of 91%.   Lights Out was at 21:51 and Lights On at 05:01. Total recording time (TRT) was 431 minutes, with a total sleep time (TST) of 427.5 minutes. The patient's sleep latency was 1.5 minutes with 0 minutes of wake time after sleep onset. REM latency was 81 minutes.  The sleep efficiency was 99.2 %.    SLEEP ARCHITECTURE: WASO (Wake after sleep onset)  was 2 minutes.  There were 5 minutes in Stage N1, 249 minutes Stage N2, 95 minutes Stage N3 and 78.5 minutes  in Stage REM.  The percentage of Stage N1 was 1.2%, Stage N2 was 58.2%, Stage N3 was 22.2% and Stage R (REM sleep) was 18.4%. The arousals were noted as: 18 were spontaneous, 0 were associated with PLMs, 1 were associated with respiratory events.  Audio and video analysis did not show any abnormal or unusual movements, behaviors, phonations or vocalizations. The patient took no bathroom breaks. The EKG was in keeping with normal sinus rhythm (NSR).  RESPIRATORY ANALYSIS:  There was a total of 10 respiratory events: 0 obstructive apneas, 0 central apneas and 0 mixed apneas with a total of 0 apneas and an apnea index (AI) of 0 /hour. There were 10 hypopneas with a hypopnea index of 1.4/hour. The patient also had 0 respiratory event related arousals (RERAs).      The total APNEA/HYPOPNEA INDEX  (AHI) was 1.4 /hour and the total RESPIRATORY DISTURBANCE INDEX was 1.4 .hour  6 events occurred in REM sleep and 4 events in NREM. The REM AHI was 4.6 /hour versus a non-REM AHI of .7 /hour.  The patient spent 95 minutes of total sleep time in the supine position and 333 minutes in non-supine. The supine AHI was 0.0, versus a non-supine AHI of 1.8.  OXYGEN SATURATION & C02:  The baseline 02 saturation was 96%, with the lowest being 88%. Time spent below 89% saturation equaled 0 minutes.  PERIODIC LIMB MOVEMENTS: The patient had a total of 0  Periodic Limb Movements. The Periodic Limb Movement (PLM) index was 0 and the PLM Arousal index was 0 /hour.  Post-study, the patient indicated that sleep was the same as usual.   IMPRESSION:   1. Obstructive Sleep Apnea (OSA)  RECOMMENDATIONS:   1. This study demonstrates resolution of the patient's obstructive sleep apnea with CPAP therapy. I will, therefore, start the patient on home CPAP treatment at a pressure of 9 cm via small Simplus FFM with heated humidity. The patient should be reminded to be fully compliant with PAP therapy to improve sleep related symptoms and  decrease long term cardiovascular risks. The patient should be reminded, that it may take up to 3 months to get fully used to using PAP with all planned sleep. The earlier full compliance is achieved, the better long term compliance tends to be. Please note that untreated obstructive sleep apnea carries additional perioperative morbidity. Patients with significant obstructive sleep apnea should receive perioperative PAP therapy and the surgeons and particularly the anesthesiologist should be informed of the diagnosis and the severity of the sleep disordered breathing. 2. The patient should be cautioned not to drive, work at heights, or operate dangerous or heavy equipment when tired or sleepy. Review and reiteration of good sleep hygiene measures should be pursued with any patient. 3. The patient will be seen in follow-up by Dr. Rexene Alberts at [facility] for discussion of the test results and further management strategies. The referring provider will be notified of the test results.   I certify that I have reviewed the entire raw data recording prior to the issuance of this report in accordance with the Standards of Accreditation of the American Academy of Sleep Medicine (AASM)     [name], MD, PhD Diplomat, American Board of Psychiatry and Neurology (Neurology and Sleep Medicine)

## 2017-03-31 NOTE — Progress Notes (Signed)
Patient referred by Dr. Larose Kells, seen by me on 11/14/16, diagnostic PSG on 01/23/17. Patient had a CPAP titration study on 03/27/17.  Please call and inform patient that I have entered an order for treatment with positive airway pressure (PAP) treatment for obstructive sleep apnea (OSA). He did well during the latest sleep study with CPAP. We will, therefore, arrange for a machine for home use through a DME (durable medical equipment) company of His choice; he may have existing DME: I will see the patient back in follow-up in about 10 weeks. Please also explain to the patient that I will be looking out for compliance data, which can be downloaded from the machine (stored on an SD card, that is inserted in the machine) or via remote access through a modem, that is built into the machine. At the time of the followup appointment we will discuss sleep study results and how it is going with PAP treatment at home. Please advise patient to bring His machine at the time of the first FU visit, even though this is cumbersome. Bringing the machine for every visit after that will likely not be needed, but often helps for the first visit to troubleshoot if needed. Please re-enforce the importance of compliance with treatment and the need for [name] to monitor compliance data - often an insurance requirement and actually good feedback for the patient as far as how they are doing.  Also remind patient, that any interim PAP machine or mask issues should be first addressed with the DME company, as they can often help better with technical and mask fit issues. Please ask if patient has a preference regarding DME company.  Please also make sure, the patient has a follow-up appointment with me in about 10 weeks from the setup date, thanks. May see one of our nurse practitioners if needed for proper timing of the FU appointment.  Please fax or rout report to the referring provider. Thanks,   [name], MD, PhD [facility])

## 2017-04-01 ENCOUNTER — Telehealth: Payer: Self-pay

## 2017-04-01 NOTE — Telephone Encounter (Signed)
-----   Message from [name], MD sent at 03/31/2017  3:48 PM EDT ----- Patient referred by Dr. Larose Kells, seen by me on 11/14/16, diagnostic PSG on 01/23/17. Patient had a CPAP titration study on 03/27/17.  Please call and inform patient that I have entered an order for treatment with positive airway pressure (PAP) treatment for obstructive sleep apnea (OSA). He did well during the latest sleep study with CPAP. We will, therefore, arrange for a machine for home use through a DME (durable medical equipment) company of His choice; he may have existing DME: I will see the patient back in follow-up in about 10 weeks. Please also explain to the patient that I will be looking out for compliance data, which can be downloaded from the machine (stored on an SD card, that is inserted in the machine) or via remote access through a modem, that is built into the machine. At the time of the followup appointment we will discuss sleep study results and how it is going with PAP treatment at home. Please advise patient to bring His machine at the time of the first FU visit, even though this is cumbersome. Bringing the machine for every visit after that will likely not be needed, but often helps for the first visit to troubleshoot if needed. Please re-enforce the importance of compliance with treatment and the need for [name] to monitor compliance data - often an insurance requirement and actually good feedback for the patient as far as how they are doing.  Also remind patient, that any interim PAP machine or mask issues should be first addressed with the DME company, as they can often help better with technical and mask fit issues. Please ask if patient has a preference regarding DME company.  Please also make sure, the patient has a follow-up appointment with me in about 10 weeks from the setup date, thanks. May see one of our nurse practitioners if needed for proper timing of the FU appointment.  Please fax or rout report to the  referring provider. Thanks,   [name], MD, PhD [facility])

## 2017-04-01 NOTE — Telephone Encounter (Signed)
I called pt to discuss his sleep study results. No answer, left a message asking him to call me back. 

## 2017-04-02 NOTE — Telephone Encounter (Signed)
Pt returned my call. I advised pt that Dr. Rexene Alberts reviewed their sleep study results and found that pt did well with the cpap during his latest sleep study. Dr. Rexene Alberts recommends that pt start a cpap at home. I reviewed PAP compliance expectations with the pt. Pt is agreeable to starting a CPAP. I advised pt that an order will be sent to a DME, [facility], and [facility] will call the pt within about one week after they file with the pt's insurance. [facility] will show the pt how to use the machine, fit for masks, and troubleshoot the CPAP if needed. A follow up appt was made for insurance purposes with Dr. Rexene Alberts on 07/17/17 at 3:00pm. Pt verbalized understanding to arrive 15 minutes early and bring their CPAP. A letter with all of this information in it will be mailed to the pt as a reminder. I verified with the pt that the address we have on file is correct. Pt verbalized understanding of results. Pt had no questions at this time but was encouraged to call back if questions arise.

## 2017-04-02 NOTE — Telephone Encounter (Signed)
I called pt to discuss his sleep study results. No answer, left a message asking him to call me back. 

## 2017-05-16 DIAGNOSIS — H02834 Dermatochalasis of left upper eyelid: Secondary | ICD-10-CM | POA: Diagnosis not present

## 2017-05-16 DIAGNOSIS — H0288B Meibomian gland dysfunction left eye, upper and lower eyelids: Secondary | ICD-10-CM | POA: Diagnosis not present

## 2017-05-16 DIAGNOSIS — H02835 Dermatochalasis of left lower eyelid: Secondary | ICD-10-CM | POA: Diagnosis not present

## 2017-05-16 DIAGNOSIS — H2513 Age-related nuclear cataract, bilateral: Secondary | ICD-10-CM | POA: Diagnosis not present

## 2017-05-16 DIAGNOSIS — H25041 Posterior subcapsular polar age-related cataract, right eye: Secondary | ICD-10-CM | POA: Diagnosis not present

## 2017-05-16 DIAGNOSIS — H524 Presbyopia: Secondary | ICD-10-CM | POA: Diagnosis not present

## 2017-05-16 DIAGNOSIS — H0288A Meibomian gland dysfunction right eye, upper and lower eyelids: Secondary | ICD-10-CM | POA: Diagnosis not present

## 2017-05-16 DIAGNOSIS — H02831 Dermatochalasis of right upper eyelid: Secondary | ICD-10-CM | POA: Diagnosis not present

## 2017-05-16 DIAGNOSIS — H02832 Dermatochalasis of right lower eyelid: Secondary | ICD-10-CM | POA: Diagnosis not present

## 2017-06-25 ENCOUNTER — Ambulatory Visit (INDEPENDENT_AMBULATORY_CARE_PROVIDER_SITE_OTHER): Payer: Medicare Other | Admitting: Internal Medicine

## 2017-06-25 ENCOUNTER — Encounter: Payer: Self-pay | Admitting: Internal Medicine

## 2017-06-25 VITALS — BP 130/78 | HR 62 | Temp 98.1°F | Resp 16 | Ht 70.0 in | Wt 263.2 lb

## 2017-06-25 DIAGNOSIS — M545 Low back pain, unspecified: Secondary | ICD-10-CM

## 2017-06-25 MED ORDER — CYCLOBENZAPRINE HCL 10 MG PO TABS: 10.0000 mg | ORAL_TABLET | Freq: Every evening | ORAL | 0 refills | Status: DC | PRN

## 2017-06-25 MED ORDER — PREDNISONE 10 MG PO TABS: ORAL_TABLET | ORAL | 0 refills | Status: DC

## 2017-06-25 NOTE — Progress Notes (Signed)
Subjective:    Patient ID: Dale [name], male    DOB: December 31, 1950, 67 y.o.   MRN: [id number]  DOS:  06/25/2017 Type of visit - description : Acute visit Interval history: About 1 year history of episodic back pain, worse for the last 2 weeks,. Pain is in the midline, low back, occasionally radiates to the left of the lower back but not to the lower extremities. Decrease when he sits upright or when he lays down, increased when he bends. When the pain strikes him, it is intense and linger for a couple of hours. Ibuprofen helps to some extent.  Review of Systems  Denies fever chills.  No injury No lower extremity paresthesias  Past Medical History:  Diagnosis Date  . BCC (basal cell carcinoma of skin)    dr Allyson Sabal  . Decreased hearing    Left, saw audiologist in 2011 per pt   . DJD (degenerative joint disease)   . Elevated liver function tests   . Hyperlipidemia    mixed  . Hypertension   . Obesity   . Ocular rosacea    on doxy as of 01-2013  . OSA (obstructive sleep apnea)    on CPAP  . Tubular adenoma of colon 2017    Past Surgical History:  Procedure Laterality Date  . COLONOSCOPY W/ POLYPECTOMY  10/20/2015   tubular adenoma  . TONSILLECTOMY      Social History   Socioeconomic History  . Marital status: Married    Spouse name: Not on file  . Number of children: 3  . Years of education: Not on file  . Highest education level: Not on file  Occupational History  . Occupation: Pharmacist, community  Social Needs  . Financial resource strain: Not on file  . Food insecurity:    Worry: Not on file    Inability: Not on file  . Transportation needs:    Medical: Not on file    Non-medical: Not on file  Tobacco Use  . Smoking status: Former Smoker    Last attempt to quit: 11/21/1968    Years since quitting: 48.6  . Smokeless tobacco: Never Used  . Tobacco comment: quit late 70s  Substance and Sexual Activity  . Alcohol use: Yes    Comment: socially  . Drug use: No    . Sexual activity: Not on file  Lifestyle  . Physical activity:    Days per week: Not on file    Minutes per session: Not on file  . Stress: Not on file  Relationships  . Social connections:    Talks on phone: Not on file    Gets together: Not on file    Attends religious service: Not on file    Active member of club or organization: Not on file    Attends meetings of clubs or organizations: Not on file    Relationship status: Not on file  . Intimate partner violence:    Fear of current or ex partner: Not on file    Emotionally abused: Not on file    Physically abused: Not on file    Forced sexual activity: Not on file  Other Topics Concern  . Not on file  Social History Narrative   Household , one child at home       Allergies as of 06/25/2017   No Known Allergies     Medication List        Accurate as of 06/25/17  3:55 PM. Always use your  most recent med list.          aspirin 81 MG tablet Take 81 mg by mouth daily.   atorvastatin 10 MG tablet Commonly known as:  LIPITOR Take 1 tablet (10 mg total) by mouth at bedtime.   cyclobenzaprine 10 MG tablet Commonly known as:  FLEXERIL Take 1 tablet (10 mg total) by mouth at bedtime as needed for muscle spasms.   fish oil-omega-3 fatty acids 1000 MG capsule Take 1 g by mouth daily.   ibuprofen 800 MG tablet Commonly known as:  ADVIL,MOTRIN Take 800 mg by mouth every 8 (eight) hours as needed.   predniSONE 10 MG tablet Commonly known as:  DELTASONE 4 tablets x 2 days, 3 tabs x 2 days, 2 tabs x 2 days, 1 tab x 2 days   tamsulosin 0.4 MG Caps capsule Commonly known as:  FLOMAX Take 0.4 mg by mouth daily.   Vitamin D 1000 units capsule Take 1,000 Units by mouth daily.          Objective:   Physical Exam BP 130/78 (BP Location: Left Arm, Patient Position: Sitting, Cuff Size: Large)   Pulse 62   Temp 98.1 F (36.7 C) (Oral)   Resp 16   Ht 5\' 10"  (1.778 m)   Wt 263 lb 3.2 oz (119.4 kg)   SpO2 98%    BMI 37.77 kg/m  General:   Well developed, NAD.  HEENT:  Normocephalic . Face symmetric, atraumatic MSK: No TTP of the lower back or sacroiliac areas  skin: Not pale. Not jaundice Neurologic:  alert & oriented X3.  Speech normal, gait appropriate for age and unassisted.  DTRs symmetric.  Straight leg test negative.  Posture not antalgic Psych--  Cognition and judgment appear intact.  Cooperative with normal attention span and concentration.  Behavior appropriate. No anxious or depressed appearing.      Assessment & Plan:   Assessment Elevated BP Hyperlipidemia Elevated LFTs DJD Morbid obesity: BMI 36 + OSA OSA, + CPAP BCC, Dr Allyson Sabal Ocular rosacea HOH, L   PLAN: Acute lumbalgia: No red flag symptoms, will prescribe a round of prednisone, Flexeril, continue with Tylenol.  Stretching encouraged. Call if not gradually better.

## 2017-06-25 NOTE — Patient Instructions (Signed)
Prednisone  for few days, early in the morning, may cause difficulty sleeping  You can also take Tylenol as needed  Flexeril, a muscle relaxant, at bedtime.  Will cause drowsiness  Wam or cold compresses  Back stretching  If not gradually better in the next few days  Check the [facility] website for more back exercises and videos   Back Exercises If you have pain in your back, do these exercises 2-3 times each day or as told by your doctor. When the pain goes away, do the exercises once each day, but repeat the steps more times for each exercise (do more repetitions). If you do not have pain in your back, do these exercises once each day or as told by your doctor. Exercises Single Knee to Chest  Do these steps 3-5 times in a row for each leg: 1. Lie on your back on a firm bed or the floor with your legs stretched out. 2. Bring one knee to your chest. 3. Hold your knee to your chest by grabbing your knee or thigh. 4. Pull on your knee until you feel a gentle stretch in your lower back. 5. Keep doing the stretch for 10-30 seconds. 6. Slowly let go of your leg and straighten it.  Pelvic Tilt  Do these steps 5-10 times in a row: 1. Lie on your back on a firm bed or the floor with your legs stretched out. 2. Bend your knees so they point up to the ceiling. Your feet should be flat on the floor. 3. Tighten your lower belly (abdomen) muscles to press your lower back against the floor. This will make your tailbone point up to the ceiling instead of pointing down to your feet or the floor. 4. Stay in this position for 5-10 seconds while you gently tighten your muscles and breathe evenly.  Cat-Cow  Do these steps until your lower back bends more easily: 1. Get on your hands and knees on a firm surface. Keep your hands under your shoulders, and keep your knees under your hips. You may put padding under your knees. 2. Let your head hang down, and make your tailbone point down to the  floor so your lower back is round like the back of a cat. 3. Stay in this position for 5 seconds. 4. Slowly lift your head and make your tailbone point up to the ceiling so your back hangs low (sags) like the back of a cow. 5. Stay in this position for 5 seconds.  Press-Ups  Do these steps 5-10 times in a row: 1. Lie on your belly (face-down) on the floor. 2. Place your hands near your head, about shoulder-width apart. 3. While you keep your back relaxed and keep your hips on the floor, slowly straighten your arms to raise the top half of your body and lift your shoulders. Do not use your back muscles. To make yourself more comfortable, you may change where you place your hands. 4. Stay in this position for 5 seconds. 5. Slowly return to lying flat on the floor.  Bridges  Do these steps 10 times in a row: 1. Lie on your back on a firm surface. 2. Bend your knees so they point up to the ceiling. Your feet should be flat on the floor. 3. Tighten your butt muscles and lift your butt off of the floor until your waist is almost as high as your knees. If you do not feel the muscles working in your butt  and the back of your thighs, slide your feet 1-2 inches farther away from your butt. 4. Stay in this position for 3-5 seconds. 5. Slowly lower your butt to the floor, and let your butt muscles relax.  If this exercise is too easy, try doing it with your arms crossed over your chest. Belly Crunches  Do these steps 5-10 times in a row: 1. Lie on your back on a firm bed or the floor with your legs stretched out. 2. Bend your knees so they point up to the ceiling. Your feet should be flat on the floor. 3. Cross your arms over your chest. 4. Tip your chin a little bit toward your chest but do not bend your neck. 5. Tighten your belly muscles and slowly raise your chest just enough to lift your shoulder blades a tiny bit off of the floor. 6. Slowly lower your chest and your head to the  floor.  Back Lifts Do these steps 5-10 times in a row: 1. Lie on your belly (face-down) with your arms at your sides, and rest your forehead on the floor. 2. Tighten the muscles in your legs and your butt. 3. Slowly lift your chest off of the floor while you keep your hips on the floor. Keep the back of your head in line with the curve in your back. Look at the floor while you do this. 4. Stay in this position for 3-5 seconds. 5. Slowly lower your chest and your face to the floor.  Contact a doctor if:  Your back pain gets a lot worse when you do an exercise.  Your back pain does not lessen 2 hours after you exercise. If you have any of these problems, stop doing the exercises. Do not do them again unless your doctor says it is okay. Get help right away if:  You have sudden, very bad back pain. If this happens, stop doing the exercises. Do not do them again unless your doctor says it is okay. This information is not intended to replace advice given to you by your health care provider. Make sure you discuss any questions you have with your health care provider. Document Released: 02/09/2010 Document Revised: 06/15/2015 Document Reviewed: 03/03/2014 Elsevier Interactive Patient Education  [employer].

## 2017-06-26 DIAGNOSIS — L905 Scar conditions and fibrosis of skin: Secondary | ICD-10-CM | POA: Diagnosis not present

## 2017-06-26 DIAGNOSIS — L570 Actinic keratosis: Secondary | ICD-10-CM | POA: Diagnosis not present

## 2017-06-26 DIAGNOSIS — Z85828 Personal history of other malignant neoplasm of skin: Secondary | ICD-10-CM | POA: Diagnosis not present

## 2017-06-26 NOTE — Assessment & Plan Note (Signed)
Acute lumbalgia: No red flag symptoms, will prescribe a round of prednisone, Flexeril, continue with Tylenol.  Stretching encouraged. Call if not gradually better.

## 2017-07-03 DIAGNOSIS — M545 Low back pain: Secondary | ICD-10-CM | POA: Diagnosis not present

## 2017-07-08 DIAGNOSIS — Z87891 Personal history of nicotine dependence: Secondary | ICD-10-CM | POA: Diagnosis not present

## 2017-07-08 DIAGNOSIS — M545 Low back pain: Secondary | ICD-10-CM | POA: Diagnosis not present

## 2017-07-08 DIAGNOSIS — M5136 Other intervertebral disc degeneration, lumbar region: Secondary | ICD-10-CM | POA: Diagnosis not present

## 2017-07-08 DIAGNOSIS — M62838 Other muscle spasm: Secondary | ICD-10-CM | POA: Diagnosis not present

## 2017-07-08 DIAGNOSIS — G8929 Other chronic pain: Secondary | ICD-10-CM | POA: Diagnosis not present

## 2017-07-14 ENCOUNTER — Other Ambulatory Visit: Payer: Self-pay | Admitting: Internal Medicine

## 2017-07-17 ENCOUNTER — Ambulatory Visit: Payer: Self-pay | Admitting: Neurology

## 2017-07-28 DIAGNOSIS — G8929 Other chronic pain: Secondary | ICD-10-CM | POA: Diagnosis not present

## 2017-07-28 DIAGNOSIS — M545 Low back pain: Secondary | ICD-10-CM | POA: Diagnosis not present

## 2017-08-06 DIAGNOSIS — M545 Low back pain: Secondary | ICD-10-CM | POA: Diagnosis not present

## 2017-08-06 DIAGNOSIS — G8929 Other chronic pain: Secondary | ICD-10-CM | POA: Diagnosis not present

## 2017-08-08 DIAGNOSIS — G8929 Other chronic pain: Secondary | ICD-10-CM | POA: Diagnosis not present

## 2017-08-08 DIAGNOSIS — Z87891 Personal history of nicotine dependence: Secondary | ICD-10-CM | POA: Diagnosis not present

## 2017-08-08 DIAGNOSIS — M47816 Spondylosis without myelopathy or radiculopathy, lumbar region: Secondary | ICD-10-CM | POA: Diagnosis not present

## 2017-08-08 DIAGNOSIS — M545 Low back pain: Secondary | ICD-10-CM | POA: Diagnosis not present

## 2017-08-15 DIAGNOSIS — M545 Low back pain: Secondary | ICD-10-CM | POA: Diagnosis not present

## 2017-08-15 DIAGNOSIS — G8929 Other chronic pain: Secondary | ICD-10-CM | POA: Diagnosis not present

## 2017-08-21 DIAGNOSIS — M545 Low back pain: Secondary | ICD-10-CM | POA: Diagnosis not present

## 2017-08-21 DIAGNOSIS — G8929 Other chronic pain: Secondary | ICD-10-CM | POA: Diagnosis not present

## 2017-08-21 DIAGNOSIS — M5387 Other specified dorsopathies, lumbosacral region: Secondary | ICD-10-CM | POA: Diagnosis not present

## 2017-08-22 ENCOUNTER — Encounter: Payer: No Typology Code available for payment source | Admitting: Internal Medicine

## 2017-08-28 DIAGNOSIS — M545 Low back pain: Secondary | ICD-10-CM | POA: Diagnosis not present

## 2017-08-28 DIAGNOSIS — M5387 Other specified dorsopathies, lumbosacral region: Secondary | ICD-10-CM | POA: Diagnosis not present

## 2017-08-28 DIAGNOSIS — G8929 Other chronic pain: Secondary | ICD-10-CM | POA: Diagnosis not present

## 2017-09-19 ENCOUNTER — Encounter: Payer: Self-pay | Admitting: Internal Medicine

## 2017-09-19 ENCOUNTER — Ambulatory Visit (INDEPENDENT_AMBULATORY_CARE_PROVIDER_SITE_OTHER): Payer: Medicare Other | Admitting: Internal Medicine

## 2017-09-19 VITALS — BP 120/62 | HR 57 | Temp 98.0°F | Resp 16 | Ht 70.0 in | Wt 260.0 lb

## 2017-09-19 DIAGNOSIS — M545 Low back pain, unspecified: Secondary | ICD-10-CM

## 2017-09-19 DIAGNOSIS — E7800 Pure hypercholesterolemia, unspecified: Secondary | ICD-10-CM | POA: Diagnosis not present

## 2017-09-19 DIAGNOSIS — R292 Abnormal reflex: Secondary | ICD-10-CM | POA: Diagnosis not present

## 2017-09-19 DIAGNOSIS — G629 Polyneuropathy, unspecified: Secondary | ICD-10-CM

## 2017-09-19 DIAGNOSIS — G4730 Sleep apnea, unspecified: Secondary | ICD-10-CM

## 2017-09-19 LAB — CBC
HEMATOCRIT: 43.6 % (ref 39.0–52.0)
Hemoglobin: 14.7 g/dL (ref 13.0–17.0)
MCHC: 33.7 g/dL (ref 30.0–36.0)
MCV: 88 fl (ref 78.0–100.0)
PLATELETS: 139 10*3/uL — AB (ref 150.0–400.0)
RBC: 4.96 Mil/uL (ref 4.22–5.81)
RDW: 13.7 % (ref 11.5–15.5)
WBC: 5.3 10*3/uL (ref 4.0–10.5)

## 2017-09-19 LAB — TSH: TSH: 2.07 u[IU]/mL (ref 0.35–4.50)

## 2017-09-19 LAB — FOLATE: FOLATE: 6.7 ng/mL (ref >5.9)

## 2017-09-19 LAB — HEMOGLOBIN A1C: Hgb A1c MFr Bld: 5.4 % (ref 4.6–6.5)

## 2017-09-19 NOTE — Progress Notes (Signed)
Subjective:    Patient ID: Dale [name], male    DOB: 10-17-1950, 67 y.o.   MRN: [id number]  DOS:  09/19/2017 Type of visit - description : rov Interval history: OSA: Good CPAP compliance High cholesterol: On Lipitor, no apparent side effects LUTS: On Flomax, to see urology soon. One  of his main concerns today is problems with near falls: Had several episodes and he did fall once.  Reports that his left leg proprioception is off, sensory is okay. Injury he is left food 2 weeks ago, hyper extended the distal foot.  Had pain, swelling, ecchymosis.  Overall is improving.   Review of Systems No chest pain, difficulty breathing no lower extremity edema No nausea, vomiting, diarrhea.  No blood in the stools. No headache, dizziness, diplopia, slurred speech or motor deficits  Past Medical History:  Diagnosis Date  . BCC (basal cell carcinoma of skin)    dr Allyson Sabal  . Decreased hearing    Left, saw audiologist in 2011 per pt   . DJD (degenerative joint disease)   . Elevated liver function tests   . Hyperlipidemia    mixed  . Hypertension   . Obesity   . Ocular rosacea    on doxy as of 01-2013  . OSA (obstructive sleep apnea)    on CPAP  . Tubular adenoma of colon 2017    Past Surgical History:  Procedure Laterality Date  . COLONOSCOPY W/ POLYPECTOMY  10/20/2015   tubular adenoma  . TONSILLECTOMY      Social History   Socioeconomic History  . Marital status: Married    Spouse name: Not on file  . Number of children: 3  . Years of education: Not on file  . Highest education level: Not on file  Occupational History  . Occupation: Pharmacist, community  Social Needs  . Financial resource strain: Not on file  . Food insecurity:    Worry: Not on file    Inability: Not on file  . Transportation needs:    Medical: Not on file    Non-medical: Not on file  Tobacco Use  . Smoking status: Former Smoker    Last attempt to quit: 11/21/1968    Years since quitting: 48.8  .  Smokeless tobacco: Never Used  . Tobacco comment: quit late 70s  Substance and Sexual Activity  . Alcohol use: Yes    Comment: socially  . Drug use: No  . Sexual activity: Not on file  Lifestyle  . Physical activity:    Days per week: Not on file    Minutes per session: Not on file  . Stress: Not on file  Relationships  . Social connections:    Talks on phone: Not on file    Gets together: Not on file    Attends religious service: Not on file    Active member of club or organization: Not on file    Attends meetings of clubs or organizations: Not on file    Relationship status: Not on file  . Intimate partner violence:    Fear of current or ex partner: Not on file    Emotionally abused: Not on file    Physically abused: Not on file    Forced sexual activity: Not on file  Other Topics Concern  . Not on file  Social History Narrative   Household , one child at home       Allergies as of 09/19/2017   No Known Allergies  Medication List        Accurate as of 09/19/17 11:59 PM. Always use your most recent med list.          aspirin 81 MG tablet Take 81 mg by mouth daily.   atorvastatin 10 MG tablet Commonly known as:  LIPITOR Take 1 tablet (10 mg total) by mouth at bedtime.   cyclobenzaprine 10 MG tablet Commonly known as:  FLEXERIL Take 1 tablet (10 mg total) by mouth at bedtime as needed for muscle spasms.   fish oil-omega-3 fatty acids 1000 MG capsule Take 1 g by mouth daily.   ibuprofen 800 MG tablet Commonly known as:  ADVIL,MOTRIN Take 800 mg by mouth every 8 (eight) hours as needed.   tamsulosin 0.4 MG Caps capsule Commonly known as:  FLOMAX Take 0.4 mg by mouth daily.   Vitamin D 1000 units capsule Take 1,000 Units by mouth daily.          Objective:   Physical Exam BP 120/62 (BP Location: Left Arm, Patient Position: Sitting, Cuff Size: Large)   Pulse (!) 57   Temp 98 F (36.7 C) (Oral)   Resp 16   Ht 5\' 10"  (1.778 m)   Wt 260 lb  (117.9 kg)   SpO2 97%   BMI 37.31 kg/m   General: Well developed, NAD, see BMI.  Neck: No  thyromegaly  HEENT:  Normocephalic . Face symmetric, atraumatic Lungs:  CTA B Normal respiratory effort, no intercostal retractions, no accessory muscle use. Heart: RRR,  no murmur.  No pretibial edema bilaterally  Abdomen:  Not distended, soft, non-tender. No rebound or rigidity.   MSK: Right foot normal, left foot with some ecchymosis at the toes but otherwise no deformities.  Slightly TTP at the distal metatarsal 3 4 and 5. Skin: Exposed areas without rash. Not pale. Not jaundice Neurologic:  alert & oriented X3.  Speech normal, gait appropriate for age and unassisted Strength symmetric and appropriate for age.  DTRs: Unable to obtain knee jerks otherwise they are symmetric and slightly slow.  Pinprick examination of the distal extremities normal. Psych: Cognition and judgment appear intact.  Cooperative with normal attention span and concentration.  Behavior appropriate. No anxious or depressed appearing.     Assessment & Plan:   Assessment Elevated BP Hyperlipidemia Elevated LFTs DJD Morbid obesity: BMI 36 + OSA OSA, + CPAP BCC, Dr Allyson Sabal Ocular rosacea HOH, L   PLAN: Hyperlipidemia: Last FLP satisfactory, continue Lipitor Back pain: Seen elsewhere, improving Neuropathy?  Patient reports decreased proprioception of the left leg and several near falls.  Neurological exam show absent knee jerks.  I am not exactly clear what is the meaning of his symptoms but will ask neurology to see in a nonurgent basis.  Check a TSH, B12.  Folic acid, Z6X. Morbid obesity: unchanged Preventive care reviewed. RTC 6 months

## 2017-09-19 NOTE — Assessment & Plan Note (Addendum)
Td 2010;  pnm shot --2016; prevnar 07-2015 . Reports previous hepatitis B shots. Zostavax ~ 2010 [facility]: Cscope w/ Dr Fredda Hammed ([place])  ~ 2008, had polyps, redundant colon Cscope 07/2015, wnl per pt, no report  Prostate cancer screening : Father had prostate cancer at age 69, DREslt abnormal 08/2016, PSA 1.83  , to see urology soon

## 2017-09-19 NOTE — Patient Instructions (Signed)
GO TO THE LAB : Get the blood work     GO TO THE FRONT DESK Schedule your next appointment for a  Check up in 6 months   

## 2017-09-21 NOTE — Assessment & Plan Note (Addendum)
Hyperlipidemia: Last FLP satisfactory, continue Lipitor Back pain: Seen elsewhere, improving Neuropathy?  Patient reports decreased proprioception of the left leg and several near falls.  Neurological exam show absent knee jerks.  I am not exactly clear what is the meaning of his symptoms but will ask neurology to see in a nonurgent basis.  Check a TSH, B12.  Folic acid, F1G. Morbid obesity: unchanged Preventive care reviewed. RTC 6 months

## 2017-09-24 ENCOUNTER — Encounter: Payer: Self-pay | Admitting: Neurology

## 2017-09-24 LAB — VITAMIN B1: VITAMIN B1 (THIAMINE): 7 nmol/L — AB (ref 8–30)

## 2017-09-30 ENCOUNTER — Encounter: Payer: Self-pay | Admitting: Neurology

## 2017-10-02 ENCOUNTER — Encounter: Payer: Self-pay | Admitting: Neurology

## 2017-10-02 ENCOUNTER — Ambulatory Visit (INDEPENDENT_AMBULATORY_CARE_PROVIDER_SITE_OTHER): Payer: Medicare Other | Admitting: Neurology

## 2017-10-02 VITALS — BP 142/78 | HR 63 | Ht 70.5 in | Wt 261.0 lb

## 2017-10-02 DIAGNOSIS — G4733 Obstructive sleep apnea (adult) (pediatric): Secondary | ICD-10-CM | POA: Diagnosis not present

## 2017-10-02 DIAGNOSIS — Z9989 Dependence on other enabling machines and devices: Secondary | ICD-10-CM | POA: Diagnosis not present

## 2017-10-02 NOTE — Progress Notes (Signed)
Subjective:    Patient ID: Dale [name] is a 67 y.o. male.  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 The patient is unaccompanied today. I first met him on 11/14/2016 at the request of his primary care physician, at which time he reported a prior diagnosis of obstructive sleep apnea but needed reevaluation as it had been several years and he had an older CPAP machine. He was advised to proceed with sleep study testing. He had a baseline sleep study, followed by a CPAP titration study. I went over his test results with him in detail today. Baseline sleep study from 01/23/2017 showed a sleep efficiency of 80.1 minutes, sleep latency of 5.5 minutes and REM latency mildly delayed at 147 minutes. He had an increased percentage of slow-wave sleep and a decreased percentage of REM sleep. Total AHI was 12.6 per hour, REM AHI was 78.9 per hour. Average oxygen saturation was 96%, nadir was 83%. He had no significant PLMS. He was advised to proceed with a CPAP titration study. He had this on 03/27/2017. Sleep efficiency was 99.2%, sleep latency 1.5 minutes and REM latency normal at 81 minutes. He had a normal percentage of slow-wave sleep and REM sleep was near normal at 18.4%. He was fitted with a small full face mask and CPAP was titrated from 5 cm to 9 cm. On the final titration pressure his AHI was 0 per hour with brief supine REM sleep achieved an O2 nadir of 91%. Based on his test results I prescribed CPAP therapy for home use with a new machine and new equipment at a pressure of 9 cm.  Today, 10/02/2017: I reviewed his CPAP compliance data from  09/01/2017 through 09/30/2017 which is a total of 30 days, during which time he used his CPAP 29 days with percent used days greater than 4 hours at 97%, indicating excellent compliance with an average usage of 7 hours and 9 minutes, residual AHI of 0.4 per hour, leak on the low side with the 95th percentile at 5.9 L/m on a pressure of 9 cm with EPR of 3. He reports Doing rather well. Has adapted to the new machine quite well. Would like to have the ramp time reduced return office he feels that the pressure is not enough in the beginning. He is using a fullface mask with good success, sometimes he has to adjust the mask because of leakage. He is quite pleased with the quietness of the machine and so is his wife. Has recently lost a little bit of weight. Has not particularly tried to lose weight but could be because of optimization of sleep apnea treatment I explained to him. Nevertheless, he is encouraged to keep his appointments with his primary care physician  The patient's allergies, current medications, family history, past medical history, past social history, past surgical history and problem list were reviewed and updated as appropriate.   Previously:  11/14/2016: (He) was previously diagnosed with obstructive sleep apnea and placed on CPAP therapy. His sleep study was over 10 years ago, prior study results are not available for my review today. A CPAP download was not available for my review today as the machine is older. I reviewed  your office note from 08/23/2016. His Epworth sleepiness score is 7 out of 24, fatigue score is 10 out of 63. He lives with his wife and son. He has 3 children. He works as a Pharmacist, community. He quit smoking in 1979 and drinks alcohol about 3 times a week and reports drinking caffeine "a lot": multiple cups of coffee per day. His wife reports that he is not necessarily all that well rested, when he first started on CPAP some 13 years ago he had a great response to it, felt a big  difference in his daytime energy. He does report an approximately 30 pound weight gain since his original diagnosis. He has 2 daughters, ages 35 and 81, their 59 year old son lives with them. He had issues with leg cramps for a while. He temporarily stopped his atorvastatin at the time but has been able to restart it. Denies FHx of OSA and has no Sx of RLS. He has no night to night nocturia and denies morning headaches.  His Past Medical History Is Significant For: Past Medical History:  Diagnosis Date  . BCC (basal cell carcinoma of skin)    dr Allyson Sabal  . Decreased hearing    Left, saw audiologist in 2011 per pt   . DJD (degenerative joint disease)   . Elevated liver function tests   . Hyperlipidemia    mixed  . Hypertension   . Obesity   . Ocular rosacea    on doxy as of 01-2013  . OSA (obstructive sleep apnea)    on CPAP  . Tubular adenoma of colon 2017    His Past Surgical History Is Significant For: Past Surgical History:  Procedure Laterality Date  . COLONOSCOPY W/ POLYPECTOMY  10/20/2015   tubular adenoma  . TONSILLECTOMY      His Family History Is Significant For: Family History  Problem Relation Age of Onset  . Prostate cancer Father 33       age 69s died at 11, also an UNCLE  . Coronary artery disease Paternal Grandmother   . Diabetes Mother   . Hypertension Mother   . Heart failure Maternal Grandmother   . Esophageal cancer Paternal Grandfather   . Colon cancer Neg Hx   . Stroke Neg Hx     His Social History Is Significant For: Social History   Socioeconomic History  . Marital status: Married    Spouse name: Not on file  . Number of children: 3  . Years of education: Not on file  . Highest education level: Not on file  Occupational History  . Occupation: Pharmacist, community  Social Needs  . Financial resource strain: Not on file  . Food insecurity:    Worry: Not on file    Inability: Not on file  . Transportation needs:    Medical: Not on file     Non-medical: Not on file  Tobacco Use  . Smoking status: Former Smoker    Last attempt to quit: 11/21/1968    Years since quitting: 48.8  . Smokeless tobacco: Never Used  . Tobacco comment: quit late 70s  Substance and Sexual Activity  . Alcohol use: Yes    Comment: socially  . Drug use: No  . Sexual activity: Not on file  Lifestyle  . Physical activity:    Days per week: Not on file    Minutes per session: Not on file  . Stress: Not on file  Relationships  . Social connections:    Talks on phone: Not  on file    Gets together: Not on file    Attends religious service: Not on file    Active member of club or organization: Not on file    Attends meetings of clubs or organizations: Not on file    Relationship status: Not on file  Other Topics Concern  . Not on file  Social History Narrative   Household , one child at home     His Allergies Are:  No Known Allergies:   His Current Medications Are:  Outpatient Encounter Medications as of 10/02/2017  Medication Sig  . aspirin 81 MG tablet Take 81 mg by mouth daily.    Marland Kitchen atorvastatin (LIPITOR) 10 MG tablet Take 1 tablet (10 mg total) by mouth at bedtime.  . Cholecalciferol (VITAMIN D) 1000 UNITS capsule Take 1,000 Units by mouth daily.    . fish oil-omega-3 fatty acids 1000 MG capsule Take 1 g by mouth daily.   Marland Kitchen ibuprofen (ADVIL,MOTRIN) 800 MG tablet Take 800 mg by mouth every 8 (eight) hours as needed.  . tamsulosin (FLOMAX) 0.4 MG CAPS capsule Take 0.4 mg by mouth daily.  . [DISCONTINUED] cyclobenzaprine (FLEXERIL) 10 MG tablet Take 1 tablet (10 mg total) by mouth at bedtime as needed for muscle spasms.   No facility-administered encounter medications on file as of 10/02/2017.   :  Review of Systems:  Out of a complete 14 point review of systems, all are reviewed and negative with the exception of these symptoms as listed below:  Review of Systems  Neurological:       Pt presents today to discuss his cpap. This cpap is  quieter than his old cpap.    Objective:  Neurological Exam  Physical Exam Physical Examination:   Vitals:   10/02/17 1401  BP: (!) 142/78  Pulse: 63    General Examination: The patient is a very pleasant 67 y.o. male in no acute distress. He appears well-developed and well-nourished and well groomed.   HEENT: Normocephalic, atraumatic, pupils are equal, round and reactive to light and accommodation. Extraocular tracking is good without limitation to gaze excursion or nystagmus noted. Normal smooth pursuit is noted. Hearing is grossly intact. Face is symmetric with normal facial animation and normal facial sensation. Speech is clear with no dysarthria noted. There is no hypophonia. There is no lip, neck/head, jaw or voice tremor. Neck shows FROM. Oropharynx exam reveals: mild mouth dryness, adequate dental hygiene and mild airway crowding. Tongue protrudes centrally and palate elevates symmetrically. Tonsils are absent.  Chest: Clear to auscultation without wheezing, rhonchi or crackles noted.  Heart: S1+S2+0, regular and normal without murmurs, rubs or gallops noted.   Abdomen: Soft, non-tender and non-distended with normal bowel sounds appreciated on auscultation.  Extremities: There is no pitting edema in the distal lower extremities bilaterally.  Skin: Warm and dry without trophic changes noted. Rosacea around nose, stable.   Musculoskeletal: exam reveals no obvious joint deformities, tenderness or joint swelling or erythema.   Neurologically:  Mental status: The patient is awake, alert and oriented in all 4 spheres. His immediate and remote memory, attention, language skills and fund of knowledge are appropriate. There is no evidence of aphasia, agnosia, apraxia or anomia. Speech is clear with normal prosody and enunciation. Thought process is linear. Mood is normal and affect is normal.  Cranial nerves II - XII are as described above under HEENT exam.  Motor exam:  Normal bulk, strength and tone is noted. There is tremor. Fine motor skills  and coordination: grossly intact.  Cerebellar testing: No dysmetria or intention tremor. There is no truncal or gait ataxia.  Sensory exam: intact to light touch in the upper and lower extremities.  Gait, station and balance: He stands easily. No veering to one side is noted. No leaning to one side is noted. Posture is age-appropriate and stance is narrow based. Gait shows normal stride length and normal pace. No problems turning are noted.        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 He has established treatment with his new CPAP machine and has adapted well to the new machine and equipment. He is compliant with treatment and pleased with his symptom control. He is commended for his treatment adherence. We did talk about the importance of weight loss. His treatment settings are very adequate at this time, he is tolerating a full facemask. As he is not a novice to treatment of sleep apnea with CPAP and he is doing well I suggested a one-year checkup. I answered all his questions today and he was in agreement. I spent 25 minutes in total face-to-face time with the patient, more than 50% of which was spent in counseling and coordination of care, reviewing test results, reviewing medication and discussing or reviewing the diagnosis of OSA, its prognosis and treatment options. Pertinent laboratory and imaging test results that were available during this visit with the patient were reviewed by me and considered in my medical decision making (see chart for  details).

## 2017-10-02 NOTE — Progress Notes (Signed)
Order for RAMP sent to [facility].

## 2017-10-02 NOTE — Patient Instructions (Addendum)
Please continue using your CPAP regularly. While your insurance requires that you use CPAP at least 4 hours each night on 70% of the nights, I recommend, that you not skip any nights and use it throughout the night if you can. Getting used to CPAP and staying with the treatment long term does take time and patience and discipline. Untreated obstructive sleep apnea when it is moderate to severe can have an adverse impact on cardiovascular health and raise her risk for heart disease, arrhythmias, hypertension, congestive heart failure, stroke and diabetes. Untreated obstructive sleep apnea causes sleep disruption, nonrestorative sleep, and sleep deprivation. This can have an impact on your day to day functioning and cause daytime sleepiness and impairment of cognitive function, memory loss, mood disturbance, and problems focussing. Using CPAP regularly can improve these symptoms.  We will request to turn the ramp off and turn down the humidity.   Keep up the good work! I will see you back in 6 months for sleep apnea check up, and if you continue to do well on CPAP I will see you once a year thereafter.

## 2017-11-07 DIAGNOSIS — N401 Enlarged prostate with lower urinary tract symptoms: Secondary | ICD-10-CM | POA: Diagnosis not present

## 2017-11-07 LAB — PSA: PSA: 0.87

## 2017-11-14 DIAGNOSIS — R350 Frequency of micturition: Secondary | ICD-10-CM | POA: Diagnosis not present

## 2017-11-14 DIAGNOSIS — N401 Enlarged prostate with lower urinary tract symptoms: Secondary | ICD-10-CM | POA: Diagnosis not present

## 2017-11-14 DIAGNOSIS — R3915 Urgency of urination: Secondary | ICD-10-CM | POA: Diagnosis not present

## 2017-11-25 ENCOUNTER — Encounter: Payer: Self-pay | Admitting: Internal Medicine

## 2017-11-28 ENCOUNTER — Ambulatory Visit: Payer: Medicare Other

## 2017-12-05 ENCOUNTER — Ambulatory Visit (INDEPENDENT_AMBULATORY_CARE_PROVIDER_SITE_OTHER): Payer: Medicare Other

## 2017-12-05 DIAGNOSIS — Z23 Encounter for immunization: Secondary | ICD-10-CM

## 2017-12-12 ENCOUNTER — Ambulatory Visit (INDEPENDENT_AMBULATORY_CARE_PROVIDER_SITE_OTHER): Payer: Medicare Other | Admitting: Neurology

## 2017-12-12 ENCOUNTER — Encounter: Payer: Self-pay | Admitting: Neurology

## 2017-12-12 VITALS — BP 110/80 | HR 65 | Ht 70.5 in | Wt 262.2 lb

## 2017-12-12 DIAGNOSIS — R29898 Other symptoms and signs involving the musculoskeletal system: Secondary | ICD-10-CM | POA: Diagnosis not present

## 2017-12-12 DIAGNOSIS — R202 Paresthesia of skin: Secondary | ICD-10-CM | POA: Diagnosis not present

## 2017-12-12 DIAGNOSIS — M48062 Spinal stenosis, lumbar region with neurogenic claudication: Secondary | ICD-10-CM | POA: Diagnosis not present

## 2017-12-12 DIAGNOSIS — G9519 Other vascular myelopathies: Secondary | ICD-10-CM

## 2017-12-12 NOTE — Patient Instructions (Addendum)
Check MRI lumbar spine without contrast  We have sent a referral to [facility] for your MRI and they will call you directly to schedule your appt. They are located at [place]. If you need to contact them directly please call [phone].    We will call you with the results and let you know the next step

## 2017-12-12 NOTE — Progress Notes (Signed)
[facility] Clinic Note - Initial Visit   Date: 12/12/17  NYXON STRUPP MRN: [id number] DOB: [birth year]   Dear Dr. Larose Kells:   Thank you for your kind referral of Dale [name] for consultation of bilateral leg weakness. Although his history is well known to you, please allow Dale [name] to reiterate it for the purpose of our medical record. The patient was accompanied to the clinic by wife who also provides collateral information.     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    Symptoms have been ongoing for many years.  He recalls it first starting in ~2012 when he was hiking and jumped off a ledge.  Several days later, he developed painless left foot drop which self-resolved over several months.  Over the past 2-3 years, he has noticed reduced sensation of the feet and weakness of the legs.  He has had one fall this year.  He has proprioceptive loss in the left heel and tingling over the left last three toes.  He describes weakness as inability to get up from a squatting position.  He has difficulty with climbing stairs and feels weak.  He does not have low back pain.  He had not done physical therapy or had MRI lumbar spine.   Out-side paper records, electronic medical record, and images have been reviewed where available and summarized as:  Lab Results  Component Value Date   TSH 2.07 09/19/2017   Lab Results  Component Value Date   HGBA1C 5.4 09/19/2017    Past Medical History:  Diagnosis Date  . BCC (basal cell carcinoma of skin)    dr Allyson Sabal  . BPH (benign prostatic hyperplasia)   . Decreased hearing    Left, saw audiologist in 2011 per pt   . DJD (degenerative joint disease)   . Elevated liver function tests   . Hyperlipidemia    mixed  . Hypertension   . Obesity     . Ocular rosacea    on doxy as of 01-2013  . OSA (obstructive sleep apnea)    on CPAP  . Tubular adenoma of colon 2017    Past Surgical History:  Procedure Laterality Date  . COLONOSCOPY W/ POLYPECTOMY  10/20/2015   tubular adenoma  . TONSILLECTOMY       Medications:  Outpatient Encounter Medications as of 12/12/2017  Medication Sig Note  . aspirin 81 MG tablet Take 81 mg by mouth daily.     Marland Kitchen atorvastatin (LIPITOR) 10 MG tablet Take 1 tablet (10 mg total) by mouth at bedtime.   . Cholecalciferol (VITAMIN D) 1000 UNITS capsule Take 1,000 Units by mouth daily.     . fish oil-omega-3 fatty acids 1000 MG capsule Take 1 g by mouth daily.    Marland Kitchen ibuprofen (ADVIL,MOTRIN) 800 MG tablet Take 800 mg by mouth every 8 (eight) hours as needed. 08/18/2015: PRN  . tamsulosin (FLOMAX) 0.4 MG CAPS capsule Take 0.8 mg by mouth daily.     No facility-administered encounter medications on file as of 12/12/2017.      Allergies: No Known Allergies  Family History: Family History  Problem Relation Age of Onset  . Prostate cancer Father 29       age 64s died at 7, also an UNCLE  . Coronary artery disease Paternal Grandmother   . Diabetes Mother   .  Hypertension Mother   . Heart failure Maternal Grandmother   . Esophageal cancer Paternal Grandfather   . Colon cancer Neg Hx   . Stroke Neg Hx     Social History: Social History   Tobacco Use  . Smoking status: Former Smoker    Last attempt to quit: 11/21/1968    Years since quitting: 49.0  . Smokeless tobacco: Never Used  . Tobacco comment: quit late 70s  Substance Use Topics  . Alcohol use: Yes    Comment: socially  . Drug use: No   Social History   Social History Narrative   Lives with his wife in a 2 story home with a basement.  Has 3 children.  Works as a Pharmacist, community.  Education: Associate Professor.     Review of Systems:  CONSTITUTIONAL: No fevers, chills, night sweats, or weight loss.   EYES: No visual changes or eye pain ENT: No  hearing changes.  No history of nose bleeds.   RESPIRATORY: No cough, wheezing and shortness of breath.   CARDIOVASCULAR: Negative for chest pain, and palpitations.   GI: Negative for abdominal discomfort, blood in stools or black stools.  No recent change in bowel habits.   GU:  No history of incontinence.   MUSCLOSKELETAL: No history of joint pain or swelling.  No myalgias.   SKIN: Negative for lesions, rash, and itching.   HEMATOLOGY/ONCOLOGY: Negative for prolonged bleeding, bruising easily, and swollen nodes.  No history of cancer.   ENDOCRINE: Negative for cold or heat intolerance, polydipsia or goiter.   PSYCH:  No depression or anxiety symptoms.   NEURO: As Above.   Vital Signs:  BP 110/80   Pulse 65   Ht 5' 10.5" (1.791 m)   Wt 262 lb 4 oz (119 kg)   SpO2 97%   BMI 37.10 kg/m    General Medical Exam:   General:  Well appearing, comfortable.   Eyes/ENT: see cranial nerve examination.   Neck: No masses appreciated.  Full range of motion without tenderness.  No carotid bruits. Respiratory:  Clear to auscultation, good air entry bilaterally.   Cardiac:  Regular rate and rhythm, no murmur.   Extremities:  No deformities, edema, or skin discoloration.  Skin:  No rashes or lesions.  Neurological Exam: MENTAL STATUS including orientation to time, place, person, recent and remote memory, attention span and concentration, language, and fund of knowledge is normal.  Speech is not dysarthric.  CRANIAL NERVES: II:  No visual field defects.  Unremarkable fundi.   III-IV-VI: Pupils equal round and reactive to light.  Normal conjugate, extra-ocular eye movements in all directions of gaze.  No nystagmus.  No ptosis.   V:  Normal facial sensation.     VII:  Normal facial symmetry and movements.   VIII:  Normal hearing and vestibular function.   IX-X:  Normal palatal movement.   XI:  Normal shoulder shrug and head rotation.   XII:  Normal tongue strength and range of motion, no  deviation or fasciculation.  MOTOR:  No atrophy, fasciculations or abnormal movements.  No pronator drift.  Tone is normal.    Right Upper Extremity:    Left Upper Extremity:    Deltoid  5/5   Deltoid  5/5   Biceps  5/5   Biceps  5/5   Triceps  5/5   Triceps  5/5   Wrist extensors  5/5   Wrist extensors  5/5   Wrist flexors  5/5   Wrist flexors  5/5   Finger extensors  5/5   Finger extensors  5/5   Finger flexors  5/5   Finger flexors  5/5   Dorsal interossei  5/5   Dorsal interossei  5/5   Abductor pollicis  5/5   Abductor pollicis  5/5   Tone (Ashworth scale)  0  Tone (Ashworth scale)  0   Right Lower Extremity:    Left Lower Extremity:    Hip flexors  4+/5   Hip flexors  4+/5   Adductor 5-/5  Adductor 5-/5  Abductor 5/5  Abductor 5/5  Hip extensors  5/5   Hip extensors  5/5   Knee flexors  5/5   Knee flexors  5/5   Knee extensors  5/5   Knee extensors  5/5   Dorsiflexors  5/5   Dorsiflexors  5/5   Plantarflexors  5/5   Plantarflexors  5/5   Toe extensors  5/5   Toe extensors  5/5   Toe flexors  5/5   Toe flexors  5/5   Tone (Ashworth scale)  0  Tone (Ashworth scale)  0   MSRs:  Right                                                                 Left brachioradialis 1+  brachioradialis 1+  biceps 1+  biceps 1+  triceps 1+  triceps 1+  patellar 0  Patellar 0  ankle jerk 0  ankle jerk 0  plantar response down  plantar response down   SENSORY:  Temperature is reduced over the dorsum of the feet.  Vibration, pin prick, and light touch intact throughout.  Romberg's sign absent.   COORDINATION/GAIT: Normal finger-to- nose-finger.  Intact rapid alternating movements bilaterally.  Able to rise from a chair without using arms.  Unable to stand up from a deep squat.  Gait narrow based and stable. Tandem and stressed gait intact.    IMPRESSION: Bilateral leg weakness suggesting possible nerve impingement affecting L3-4 nerve roots bilaterally manifesting with both hip flexion and  adductor weakness.  He does have exertional weakness, but denies low back pain.  Reflexes in the legs are absent which may indicate radiculopathy or superimposed neuropathy, the latter which would explain distal paresthesias.  Asymmetrical sensory loss argues against neuropathy, however. I will plan to proceed with MRI lumbar spine and if this is not diagnostic, the next step is NCS/EMG of the legs    Thank you for allowing me to participate in patient's care.  If I can answer any additional questions, I would be pleased to do so.    Sincerely,    Knut Rondinelli K. Posey Pronto, DO

## 2017-12-28 ENCOUNTER — Ambulatory Visit
Admission: RE | Admit: 2017-12-28 | Discharge: 2017-12-28 | Disposition: A | Discharge status: Home / Self Care | Payer: Medicare Other | Source: Ambulatory Visit | Attending: Neurology | Admitting: Neurology

## 2017-12-28 DIAGNOSIS — M48061 Spinal stenosis, lumbar region without neurogenic claudication: Secondary | ICD-10-CM | POA: Diagnosis not present

## 2017-12-28 DIAGNOSIS — M5126 Other intervertebral disc displacement, lumbar region: Secondary | ICD-10-CM | POA: Diagnosis not present

## 2017-12-28 DIAGNOSIS — R29898 Other symptoms and signs involving the musculoskeletal system: Secondary | ICD-10-CM

## 2017-12-28 DIAGNOSIS — M48062 Spinal stenosis, lumbar region with neurogenic claudication: Secondary | ICD-10-CM

## 2017-12-28 DIAGNOSIS — G9519 Other vascular myelopathies: Secondary | ICD-10-CM

## 2017-12-30 ENCOUNTER — Telehealth: Payer: Self-pay | Admitting: *Deleted

## 2017-12-30 NOTE — Telephone Encounter (Signed)
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 We can start physical therapy for leg strengthening.  His nerve impingement in the back would not cause his feet numbness, which may be moreso due to neuropathy.  Recommend NCS/EMG of the legs to better assess how much of his paresthesias are stemming from his back vs. Neuropathy.  Thanks.

## 2017-12-30 NOTE — Telephone Encounter (Signed)
Left message for patient to call me back. 

## 2017-12-31 ENCOUNTER — Telehealth: Payer: Self-pay | Admitting: Neurology

## 2017-12-31 NOTE — Telephone Encounter (Signed)
Patient returned call to the office for his MRI results. He said he is seeing patient's today and asked could his results be faxed to [phone] with any instructions regarding a follow up appointment? Thanks

## 2018-01-01 ENCOUNTER — Telehealth: Payer: Self-pay | Admitting: *Deleted

## 2018-01-01 ENCOUNTER — Other Ambulatory Visit: Payer: Self-pay | Admitting: *Deleted

## 2018-01-01 DIAGNOSIS — R202 Paresthesia of skin: Secondary | ICD-10-CM

## 2018-01-01 DIAGNOSIS — R29898 Other symptoms and signs involving the musculoskeletal system: Secondary | ICD-10-CM

## 2018-01-01 DIAGNOSIS — M48061 Spinal stenosis, lumbar region without neurogenic claudication: Secondary | ICD-10-CM

## 2018-01-01 NOTE — Telephone Encounter (Signed)
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 We can start physical therapy for leg strengthening.  His nerve impingement in the back would not cause his feet numbness, which may be moreso due to neuropathy.  Recommend NCS/EMG of the legs to better assess how much of his paresthesias are stemming from his back vs. Neuropathy.  Thanks.

## 2018-01-01 NOTE — Telephone Encounter (Signed)
Results faxed to patient per his request.

## 2018-01-24 ENCOUNTER — Other Ambulatory Visit: Payer: Self-pay | Admitting: Internal Medicine

## 2018-01-29 ENCOUNTER — Encounter: Payer: Medicare Other | Admitting: Neurology

## 2018-01-29 ENCOUNTER — Ambulatory Visit (INDEPENDENT_AMBULATORY_CARE_PROVIDER_SITE_OTHER): Payer: Medicare Other | Admitting: Neurology

## 2018-01-29 DIAGNOSIS — R202 Paresthesia of skin: Secondary | ICD-10-CM | POA: Diagnosis not present

## 2018-01-29 DIAGNOSIS — M5417 Radiculopathy, lumbosacral region: Secondary | ICD-10-CM | POA: Diagnosis not present

## 2018-01-29 DIAGNOSIS — M48061 Spinal stenosis, lumbar region without neurogenic claudication: Secondary | ICD-10-CM | POA: Diagnosis not present

## 2018-01-29 NOTE — Procedures (Signed)
[facility]  [place], [address]  [place], [place] [address] Tel: [phone] Fax:  [phone] Test Date:  01/29/2018  Patient: Dale [name] DOB: 01-19-1951 Physician: Narda Amber, DO  Sex: Male Height: 5\' 10"  Ref Phys: Narda Amber, DO  ID#: [id number] Temp: 36.0C Technician:    Patient Complaints: This is a 68 year old man referred for evaluation of bilateral leg weakness and feet paresthesias.  NCV & EMG Findings: Extensive electrodiagnostic testing of the right lower extremity and additional studies of the left shows:  1. Bilateral sural and superficial peroneal sensory responses are within normal limits. 2. Bilateral peroneal and tibial motor responses are within normal limits. 3. Bilateral tibial H reflex studies are within normal limits. 4. Chronic motor axonal loss changes are seen affecting the L3 myotomes bilaterally, without accompanied active denervation.  Impression: 1. 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 2. There is no evidence of a large fiber sensorimotor polyneuropathy.   ___________________________ Narda Amber, DO    Nerve Conduction Studies Anti Sensory Summary Table   Site NR Peak (ms) Norm Peak (ms) P-T Amp (V) Norm P-T Amp  Left Sup Peroneal Anti Sensory (Ant Lat Mall)  36C  12 cm    2.6 <4.6 9.6 >3  Right Sup Peroneal Anti Sensory (Ant Lat Mall)  36C  12 cm    2.1 <4.6 10.3 >3  Left Sural Anti Sensory (Lat Mall)  36C  Calf    3.1 <4.6 11.5 >3  Right Sural Anti Sensory (Lat Mall)  36C  Calf    2.6 <4.6 12.2 >3   Motor Summary Table   Site NR Onset (ms) Norm Onset (ms) O-P Amp (mV) Norm O-P Amp Site1 Site2 Delta-0 (ms) Dist (cm) Vel (m/s) Norm Vel (m/s)  Left Peroneal Motor (Ext Dig Brev)  36C  Ankle    3.3 <6.0 4.4 >2.5 B Fib Ankle 7.2 38.0 53 >40  B Fib    10.5  3.5  Poplt B Fib 1.7 8.0 47 >40  Poplt    12.2  3.4         Right Peroneal Motor (Ext Dig Brev)   36C  Ankle    3.2 <6.0 5.2 >2.5 B Fib Ankle 7.1 38.0 54 >40  B Fib    10.3  4.4  Poplt B Fib 1.5 9.0 60 >40  Poplt    11.8  4.3         Left Tibial Motor (Abd Hall Brev)  36C  Ankle    2.9 <6.0 9.2 >4 Knee Ankle 9.8 41.0 42 >40  Knee    12.7  6.8         Right Tibial Motor (Abd Hall Brev)  36C  Ankle    3.7 <6.0 11.5 >4 Knee Ankle 8.1 41.0 51 >40  Knee    11.8  7.6          H Reflex Studies   NR H-Lat (ms) Lat Norm (ms) L-R H-Lat (ms)  Left Tibial (Gastroc)  36C     30.88 <35 0.54  Right Tibial (Gastroc)  36C     30.34 <35 0.54   EMG   Side Muscle Ins Act Fibs Psw Fasc Number Recrt Dur Dur. Amp Amp. Poly Poly. Comment  Right AntTibialis Nml Nml Nml Nml Nml Nml Nml Nml Nml Nml Nml Nml N/A  Right Gastroc Nml Nml Nml Nml Nml Nml Nml Nml Nml Nml Nml Nml N/A  Right Flex Dig Long  Nml Nml Nml Nml Nml Nml Nml Nml Nml Nml Nml Nml N/A  Right RectFemoris Nml Nml Nml Nml 1- Rapid Some 1+ Some 1+ Nml Nml N/A  Right GluteusMed Nml Nml Nml Nml Nml Nml Nml Nml Nml Nml Nml Nml N/A  Left GluteusMed Nml Nml Nml Nml Nml Nml Nml Nml Nml Nml Nml Nml N/A  Left AntTibialis Nml Nml Nml Nml Nml Nml Nml Nml Nml Nml Nml Nml N/A  Left Gastroc Nml Nml Nml Nml Nml Nml Nml Nml Nml Nml Nml Nml N/A  Left Flex Dig Long Nml Nml Nml Nml Nml Nml Nml Nml Nml Nml Nml Nml N/A  Left RectFemoris Nml Nml Nml Nml 1- Rapid Some 1+ Some 1+ Nml Nml N/A  Right AdductorLong Nml Nml Nml Nml 1- Rapid Some 1+ Some 1+ Nml Nml N/A      Waveforms:

## 2018-01-29 NOTE — Progress Notes (Signed)
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  The patient was accompanied to the clinic by wife who also provides collateral information.    He is here today to review MRI findings as well as undergo her electrodiagnostic testing of the legs. There has been no significant change in symptoms since his last visit.  He continues to have weakness with climbing stairs, standing up from a low position.  Medications:  Current Outpatient Medications on File Prior to Visit  Medication Sig Dispense Refill  . aspirin 81 MG tablet Take 81 mg by mouth daily.      Marland Kitchen atorvastatin (LIPITOR) 10 MG tablet Take 1 tablet (10 mg total) by mouth at bedtime. 90 tablet 1  . Cholecalciferol (VITAMIN D) 1000 UNITS capsule Take 1,000 Units by mouth daily.      . fish oil-omega-3 fatty acids 1000 MG capsule Take 1 g by mouth daily.     Marland Kitchen ibuprofen (ADVIL,MOTRIN) 800 MG tablet Take 800 mg by mouth every 8 (eight) hours as needed.    . tamsulosin (FLOMAX) 0.4 MG CAPS capsule Take 0.8 mg by mouth daily.      No current facility-administered medications on file prior to visit.     Allergies: No Known Allergies  Review of Systems:  CONSTITUTIONAL: No fevers, chills, night sweats, or weight loss.  EYES: No visual changes or eye pain ENT: No hearing changes.  No history of nose bleeds.   RESPIRATORY: No cough, wheezing and shortness of breath.   CARDIOVASCULAR: Negative for chest pain, and palpitations.   GI: Negative for abdominal discomfort, blood in stools or black stools.  No recent change in bowel habits.   GU:  No history of incontinence.   MUSCLOSKELETAL: +history of joint pain or swelling.  No myalgias.   SKIN: Negative for lesions, rash,  and itching.   ENDOCRINE: Negative for cold or heat intolerance, polydipsia or goiter.   PSYCH:  No depression or anxiety symptoms.   NEURO: As Above.   Neurological Exam: MENTAL STATUS including orientation to time, place, person, recent and remote memory, attention span and concentration, language, and fund of knowledge is normal.  Speech is not dysarthric.  CRANIAL NERVES: No visual field defects.  Pupils equal round and reactive to light.  Normal conjugate, extra-ocular eye movements in all directions of gaze.  No ptosis. Face is symmetric. Palate elevates symmetrically.  Tongue is midline.  MOTOR:  Motor strength is 5/5 in all extremities  COORDINATION/GAIT:   Gait narrow based and stable.   Data: MRI lumbar spine wo contrast 12/28/2017: At L2-3, there is multifactorial spinal stenosis because of endplate osteophytes, bulging of the disc and facet and ligamentous hypertrophy. There's crowding of the nerve roots. There is foraminal narrowing right more than left. Though definite neural compression is not established, findings at this level could be symptomatic.  At L1-2, there is a shallow right posterolateral disc herniation with slight caudal migration to the right of midline. This indents the thecal sac but does not cause visible neural compression.  NCS/EMG of the legs 01/29/2018: 1. 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 2. There is no evidence of a large fiber sensorimotor polyneuropathy.  IMPRESSION/PLAN Bilateral leg weakness  due to spinal stenosis at L2-3 with bilateral L3 radiculopathy.  Results of MRI and EMG was discussed with patient.  He does not have evidence of a large fiber neuropathy.  Recommend physical therapy for low back stretching and leg strengthening. Return to clinic if no improvement.:   Thank you for allowing me to participate in patient's care.  If I can answer any additional questions, I would be  pleased to do so.    Sincerely,    Donika K. Posey Pronto, DO

## 2018-01-29 NOTE — Addendum Note (Signed)
Addended by: Alda Berthold on: 01/29/2018 01:30 PM   Modules accepted: Level of Service

## 2018-01-30 DIAGNOSIS — D1801 Hemangioma of skin and subcutaneous tissue: Secondary | ICD-10-CM | POA: Diagnosis not present

## 2018-01-30 DIAGNOSIS — L821 Other seborrheic keratosis: Secondary | ICD-10-CM | POA: Diagnosis not present

## 2018-01-30 DIAGNOSIS — D229 Melanocytic nevi, unspecified: Secondary | ICD-10-CM | POA: Diagnosis not present

## 2018-01-30 DIAGNOSIS — L249 Irritant contact dermatitis, unspecified cause: Secondary | ICD-10-CM | POA: Diagnosis not present

## 2018-01-30 DIAGNOSIS — Z85828 Personal history of other malignant neoplasm of skin: Secondary | ICD-10-CM | POA: Diagnosis not present

## 2018-01-30 DIAGNOSIS — L819 Disorder of pigmentation, unspecified: Secondary | ICD-10-CM | POA: Diagnosis not present

## 2018-01-30 DIAGNOSIS — L814 Other melanin hyperpigmentation: Secondary | ICD-10-CM | POA: Diagnosis not present

## 2018-01-30 DIAGNOSIS — L570 Actinic keratosis: Secondary | ICD-10-CM | POA: Diagnosis not present

## 2018-02-02 ENCOUNTER — Other Ambulatory Visit: Payer: Self-pay | Admitting: *Deleted

## 2018-02-02 DIAGNOSIS — R202 Paresthesia of skin: Secondary | ICD-10-CM

## 2018-02-02 DIAGNOSIS — R29898 Other symptoms and signs involving the musculoskeletal system: Secondary | ICD-10-CM

## 2018-02-02 DIAGNOSIS — M48061 Spinal stenosis, lumbar region without neurogenic claudication: Secondary | ICD-10-CM

## 2018-02-02 NOTE — Progress Notes (Signed)
Referral sent 

## 2018-02-20 ENCOUNTER — Ambulatory Visit: Payer: Medicare Other | Admitting: Physical Therapy

## 2018-02-22 DIAGNOSIS — Y95 Nosocomial condition: Secondary | ICD-10-CM | POA: Diagnosis not present

## 2018-02-22 DIAGNOSIS — M545 Low back pain: Secondary | ICD-10-CM | POA: Diagnosis not present

## 2018-02-22 DIAGNOSIS — R109 Unspecified abdominal pain: Secondary | ICD-10-CM | POA: Diagnosis not present

## 2018-02-22 DIAGNOSIS — R509 Fever, unspecified: Secondary | ICD-10-CM | POA: Diagnosis not present

## 2018-02-22 DIAGNOSIS — R05 Cough: Secondary | ICD-10-CM | POA: Diagnosis not present

## 2018-02-22 DIAGNOSIS — I10 Essential (primary) hypertension: Secondary | ICD-10-CM | POA: Diagnosis not present

## 2018-02-22 DIAGNOSIS — J159 Unspecified bacterial pneumonia: Secondary | ICD-10-CM | POA: Diagnosis not present

## 2018-02-22 DIAGNOSIS — A419 Sepsis, unspecified organism: Secondary | ICD-10-CM | POA: Diagnosis not present

## 2018-02-22 DIAGNOSIS — R918 Other nonspecific abnormal finding of lung field: Secondary | ICD-10-CM | POA: Diagnosis not present

## 2018-02-22 DIAGNOSIS — N400 Enlarged prostate without lower urinary tract symptoms: Secondary | ICD-10-CM | POA: Diagnosis not present

## 2018-02-22 DIAGNOSIS — J181 Lobar pneumonia, unspecified organism: Secondary | ICD-10-CM | POA: Diagnosis not present

## 2018-02-22 DIAGNOSIS — B9781 Human metapneumovirus as the cause of diseases classified elsewhere: Secondary | ICD-10-CM | POA: Diagnosis not present

## 2018-02-22 DIAGNOSIS — E785 Hyperlipidemia, unspecified: Secondary | ICD-10-CM | POA: Diagnosis not present

## 2018-02-22 DIAGNOSIS — R110 Nausea: Secondary | ICD-10-CM | POA: Diagnosis not present

## 2018-02-22 DIAGNOSIS — A4189 Other specified sepsis: Secondary | ICD-10-CM | POA: Diagnosis not present

## 2018-02-22 DIAGNOSIS — A084 Viral intestinal infection, unspecified: Secondary | ICD-10-CM | POA: Diagnosis not present

## 2018-02-23 DIAGNOSIS — E785 Hyperlipidemia, unspecified: Secondary | ICD-10-CM | POA: Diagnosis present

## 2018-02-23 DIAGNOSIS — A084 Viral intestinal infection, unspecified: Secondary | ICD-10-CM | POA: Diagnosis present

## 2018-02-23 DIAGNOSIS — D696 Thrombocytopenia, unspecified: Secondary | ICD-10-CM | POA: Diagnosis present

## 2018-02-23 DIAGNOSIS — D72819 Decreased white blood cell count, unspecified: Secondary | ICD-10-CM | POA: Diagnosis not present

## 2018-02-23 DIAGNOSIS — I10 Essential (primary) hypertension: Secondary | ICD-10-CM | POA: Diagnosis present

## 2018-02-23 DIAGNOSIS — Z79899 Other long term (current) drug therapy: Secondary | ICD-10-CM | POA: Diagnosis not present

## 2018-02-23 DIAGNOSIS — A4189 Other specified sepsis: Secondary | ICD-10-CM | POA: Diagnosis present

## 2018-02-23 DIAGNOSIS — B9781 Human metapneumovirus as the cause of diseases classified elsewhere: Secondary | ICD-10-CM | POA: Diagnosis present

## 2018-02-23 DIAGNOSIS — Z6838 Body mass index (BMI) 38.0-38.9, adult: Secondary | ICD-10-CM | POA: Diagnosis not present

## 2018-02-23 DIAGNOSIS — J123 Human metapneumovirus pneumonia: Secondary | ICD-10-CM | POA: Diagnosis not present

## 2018-02-23 DIAGNOSIS — A419 Sepsis, unspecified organism: Secondary | ICD-10-CM | POA: Diagnosis not present

## 2018-02-23 DIAGNOSIS — J159 Unspecified bacterial pneumonia: Secondary | ICD-10-CM | POA: Diagnosis present

## 2018-02-23 DIAGNOSIS — Z87891 Personal history of nicotine dependence: Secondary | ICD-10-CM | POA: Diagnosis not present

## 2018-02-23 DIAGNOSIS — R197 Diarrhea, unspecified: Secondary | ICD-10-CM | POA: Diagnosis not present

## 2018-02-23 DIAGNOSIS — N400 Enlarged prostate without lower urinary tract symptoms: Secondary | ICD-10-CM | POA: Diagnosis present

## 2018-02-23 DIAGNOSIS — J189 Pneumonia, unspecified organism: Secondary | ICD-10-CM | POA: Diagnosis not present

## 2018-02-23 DIAGNOSIS — Z713 Dietary counseling and surveillance: Secondary | ICD-10-CM | POA: Diagnosis not present

## 2018-02-23 DIAGNOSIS — E7800 Pure hypercholesterolemia, unspecified: Secondary | ICD-10-CM | POA: Diagnosis present

## 2018-02-25 MED ORDER — ACETAMINOPHEN 500 MG PO TABS
1000.00 | ORAL_TABLET | ORAL | Status: DC
Start: ? — End: 2018-02-25

## 2018-02-25 MED ORDER — GENERIC EXTERNAL MEDICATION
1.00 | Status: DC
Start: 2018-02-26 — End: 2018-02-25

## 2018-02-25 MED ORDER — ENOXAPARIN SODIUM 40 MG/0.4ML ~~LOC~~ SOLN
40.00 | SUBCUTANEOUS | Status: DC
Start: 2018-02-26 — End: 2018-02-25

## 2018-02-25 MED ORDER — TAMSULOSIN HCL 0.4 MG PO CAPS
0.40 | ORAL_CAPSULE | ORAL | Status: DC
Start: 2018-02-26 — End: 2018-02-25

## 2018-02-25 MED ORDER — IPRATROPIUM-ALBUTEROL 0.5-2.5 (3) MG/3ML IN SOLN
3.00 | RESPIRATORY_TRACT | Status: DC
Start: ? — End: 2018-02-25

## 2018-02-25 MED ORDER — ATORVASTATIN CALCIUM 10 MG PO TABS
10.00 | ORAL_TABLET | ORAL | Status: DC
Start: 2018-02-25 — End: 2018-02-25

## 2018-03-19 DIAGNOSIS — B078 Other viral warts: Secondary | ICD-10-CM | POA: Diagnosis not present

## 2018-03-20 ENCOUNTER — Ambulatory Visit: Payer: Medicare Other | Attending: Neurology | Admitting: Physical Therapy

## 2018-03-20 ENCOUNTER — Other Ambulatory Visit: Payer: Self-pay

## 2018-03-20 ENCOUNTER — Encounter: Payer: Self-pay | Admitting: Physical Therapy

## 2018-03-20 VITALS — BP 114/58 | HR 57

## 2018-03-20 DIAGNOSIS — M5442 Lumbago with sciatica, left side: Secondary | ICD-10-CM | POA: Diagnosis not present

## 2018-03-20 DIAGNOSIS — R29898 Other symptoms and signs involving the musculoskeletal system: Secondary | ICD-10-CM | POA: Diagnosis not present

## 2018-03-20 DIAGNOSIS — R2681 Unsteadiness on feet: Secondary | ICD-10-CM | POA: Diagnosis not present

## 2018-03-20 DIAGNOSIS — G8929 Other chronic pain: Secondary | ICD-10-CM | POA: Insufficient documentation

## 2018-03-20 NOTE — Therapy (Signed)
[facility] [address]  [place], [place], [address] Phone: [phone]   Fax:  [phone]  Physical Therapy Evaluation  Patient Details  Name: Dale [name] MRN: [id number] Date of Birth: Apr 08, 1950 Referring Provider (PT): Narda Amber, MD   Encounter Date: 03/20/2018  PT End of Session - 03/20/18 0955    Visit Number  1    Number of Visits  7    Date for PT Re-Evaluation  05/01/18    Authorization Type  Medicare & [employer]    PT Start Time  0900    PT Stop Time  0947    PT Time Calculation (min)  47 min    Activity Tolerance  Patient tolerated treatment well    Behavior During Therapy  [facility] for tasks assessed/performed       Past Medical History:  Diagnosis Date  . BCC (basal cell carcinoma of skin)    dr Allyson Sabal  . BPH (benign prostatic hyperplasia)   . Decreased hearing    Left, saw audiologist in 2011 per pt   . DJD (degenerative joint disease)   . Elevated liver function tests   . Hyperlipidemia    mixed  . Hypertension   . Obesity   . Ocular rosacea    on doxy as of 01-2013  . OSA (obstructive sleep apnea)    on CPAP  . Tubular adenoma of colon 2017    Past Surgical History:  Procedure Laterality Date  . COLONOSCOPY W/ POLYPECTOMY  10/20/2015   tubular adenoma  . TONSILLECTOMY      Vitals:   03/20/18 0903  BP: (!) 114/58  Pulse: (!) 57  SpO2: 95%     Subjective Assessment - 03/20/18 0903    Subjective  Patient reports LBP of 40 years duration. Has had flares of LBP which improved with stretching. Pain is located over central LB at "L5-S1" with radiation of N/T down to lateral aspect of L toes. Mentions improvement in bowel/bladder habits since his recent hospitalization for pneumonia 02/22/18-02/25/18. Patient reports only the occasional cough persisting. Currently having trouble with loss of ROM in lumbar spine-particularly with forward flexion, thus having trouble  retrieving items from the floor, getting out of the car, difficulty putting on shoes, instability and catching L foot with stairs. Has had several falls with tendency to catch L heel on objects. Reports that he needs "visual cues" to avoid catching his heel. Denies decreased sensation in L foot, however does have intermittent N/T.     Pertinent History  HTN, HLD, DJD, decreased hearing, basal cell carcinoma with excision     Limitations  House hold activities;Lifting;Walking   stairs   How long can you sit comfortably?  unlimited    How long can you stand comfortably?  unlimited    How long can you walk comfortably?  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 There is no evidence of a large fiber sensorimotor polyneuropathy.; 12/28/17 lumbar MRI: L2-3 multifactorial spinal stenosis, L1-2 R posterolateral disc herniation    Patient Stated Goals  being able to pick up my credit card at the checkout line, being able to get out of my car    Currently in Pain?  Yes    Pain Score  0-No pain    Pain Location  Back    Pain Orientation  Posterior   midline  Pain Descriptors / Indicators  Stabbing    Pain Type  Chronic pain         [facility] PT Assessment - 03/20/18 0915      Assessment   Medical Diagnosis  Lumbar spinal stenosis, Paresthesia of L foot, B leg weakness    Referring Provider (PT)  Narda Amber, MD    Onset Date/Surgical Date  --   chronic   Next MD Visit  not scheduled    Prior Therapy  Yes- for LBP      Precautions   Precautions  Fall      Restrictions   Weight Bearing Restrictions  No      Balance Screen   Has the patient fallen in the past 6 months  Yes    How many times?  1   sprained L "metatarsals"   Has the patient had a decrease in activity level because of a fear of falling?   No    Is the patient reluctant to leave their home because of a fear of falling?    No      Home Film/video editor residence    Living Arrangements  Spouse/significant other    Home Access  Ramped entrance    Home Layout  Two level    Alternate Level Stairs-Number of Steps  12    Alternate Level Stairs-Rails  Right    Carpenter - standard      Prior Function   Level of Independence  Independent    Vocation  Full time employment    Archivist    Leisure  hiking      Cognition   Overall Cognitive Status  Within Functional Limits for tasks assessed      Sensation   Light Touch  Appears Intact   intermittent L foot N/T     Coordination   Gross Motor Movements are Fluid and Coordinated  Yes      Posture/Postural Control   Posture/Postural Control  Postural limitations    Postural Limitations  Rounded Shoulders;Forward head;Posterior pelvic tilt      ROM / Strength   AROM / PROM / Strength  AROM;Strength      AROM   AROM Assessment Site  Lumbar    Lumbar Flexion  distal shin    Lumbar Extension  moderately limited    Lumbar - Right Side Bend  proximal thigh   mild pain in L LB   Lumbar - Left Side Bend  proximal thigh   mild pain in L LB   Lumbar - Right Rotation  mildly limited   stiffness   Lumbar - Left Rotation  mildly limited   stiffness     Strength   Strength Assessment Site  Hip;Knee;Ankle    Right/Left Hip  Right;Left    Right Hip Flexion  4+/5    Right Hip ABduction  4+/5    Right Hip ADduction  4+/5    Left Hip Flexion  4/5    Left Hip ABduction  4+/5    Left Hip ADduction  4+/5    Right/Left Knee  Right;Left    Right Knee Flexion  4+/5    Right Knee Extension  4+/5   L pulling over iliac crest   Left Knee Flexion  4+/5    Left Knee Extension  4+/5    Right/Left Ankle  Right;Left    Right Ankle Dorsiflexion  4+/5    Right Ankle  Plantar Flexion  5/5    Left Ankle Dorsiflexion  4+/5    Left Ankle Plantar Flexion  4+/5      Flexibility   Soft Tissue Assessment /Muscle  Length  yes    Hamstrings  B moderately tight    Quadriceps  B hip flexors mildly tight in mod thomas    Piriformis  B moderately tight      Palpation   Palpation comment  increased muscle tone in B lumbar paraspinals, L glute and piriformis, TTP at L lumbar paraspinal at level of L2      Ambulation/Gait   Gait Pattern  Step-through pattern;Decreased dorsiflexion - left;Lateral hip instability    Ambulation Surface  Level;Indoor                Objective measurements completed on examination: See above findings.              PT Education - 03/20/18 0955    Education Details  prognosis, POC, HEP    Person(s) Educated  Patient    Methods  Explanation;Demonstration;Tactile cues;Verbal cues;Handout    Comprehension  Verbalized understanding;Returned demonstration       PT Short Term Goals - 03/20/18 1002      PT SHORT TERM GOAL #1   Title  Patient to be independent with initial HEP.    Time  3    Period  Weeks    Status  New    Target Date  04/10/18        PT Long Term Goals - 03/20/18 1003      PT LONG TERM GOAL #1   Title  Patient to be independent with advanced HEP.    Time  6    Period  Weeks    Status  New    Target Date  05/01/18      PT LONG TERM GOAL #2   Title  Patient to demonstrate [facility] lumbar AROM without pain limiting.     Time  6    Period  Weeks    Status  New    Target Date  05/01/18      PT LONG TERM GOAL #3   Title  Patient to demonstrate bending forward to pick up object from floor without pain limiting.     Time  6    Period  Weeks    Status  New    Target Date  05/01/18      PT LONG TERM GOAL #4   Title  Patient to report tolerance of putting on shoes without pain limiting.     Time  6    Period  Weeks    Status  New    Target Date  05/01/18      PT LONG TERM GOAL #5   Title  Patient to score >22/30 on FGA in order to decrease risk of falls.     Time  6    Period  Weeks    Status  New    Target Date  05/01/18              Plan - 03/20/18 0956    Clinical Impression Statement  Patient is a 68y/o M presenting to OPPT with c/o chronic LBP of insidious onset. Pain is located over central and L LB with radiation of N/T down to lateral aspect of L toes. Reports relief with lumbar extension stretching. Currently having trouble with loss of ROM in lumbar spine-particularly with flexion, thus having trouble  retrieving items from the floor, getting out of the car, difficulty putting on shoes, instability and catching L foot with stairs. Patient reports several falls that were caused by catching his L foot. Patient today with limited and painful lumbar AROM- worst with extension and B sidebending, good overall LE strength, decreased flexibility in B LEs, increased muscle tone in B lumbar paraspinals, L glute and piriformis, TTP at L lumbar paraspinal at level of L2, and gait deviations. BP and HR slightly decreased today, however patient asymptomatic. Educated on gentle stretching and core stability HEP- patient reported understanding. Would benefit from skilled PT services 1x/week for 6 weeks to address aforementioned impairments.     Clinical Presentation  Stable    Clinical Decision Making  Low    Rehab Potential  Good    Clinical Impairments Affecting Rehab Potential  HTN, HLD, DJD, decreased hearing, basal cell carcinoma with excision     PT Frequency  1x / week    PT Duration  6 weeks    PT Treatment/Interventions  ADLs/Self Care Home Management;Cryotherapy;Electrical Stimulation;Functional mobility training;Stair training;Gait training;DME Instruction;Ultrasound;Traction;Therapeutic activities;Therapeutic exercise;Balance training;Neuromuscular re-education;Patient/family education;Passive range of motion;Manual techniques;Dry needling;Energy conservation;Splinting;Taping    PT Next Visit Plan  reassess HEP; FOTO, assess FGA    Consulted and Agree with Plan of Care  Patient       Patient will benefit  from skilled therapeutic intervention in order to improve the following deficits and impairments:  Hypomobility, Decreased activity tolerance, Decreased strength, Pain, Decreased balance, Decreased range of motion, Improper body mechanics, Postural dysfunction, Impaired flexibility  Visit Diagnosis: Chronic midline low back pain with left-sided sciatica  Other symptoms and signs involving the musculoskeletal system  Unsteadiness on feet     Problem List Patient Active Problem List   Diagnosis Date Noted  . PCP NOTES >>>>>>>>>>>>> 08/18/2015  . BCC (basal cell carcinoma of skin)   . Ocular rosacea   . Annual physical exam 08/31/2010  . NOCTURIA 09/22/2009  . Obstructive sleep apnea 09/15/2009  . DECREASED HEARING 06/13/2009  . Elevated LFTs 12/09/2008  . DJD (degenerative joint disease) 06/03/2008  . Dyslipidemia 05/11/2008  . Morbid obesity ([place]) 05/11/2008  . Elevated blood pressure reading without diagnosis of hypertension 05/11/2008     Janene Harvey, PT, DPT 03/20/18 10:10 AM   [facility] [address]  [place], [place], [address] Phone: [phone]   Fax:  [phone]  Name: GAELEN BRAGER MRN: [id number] Date of Birth: 1950-02-09

## 2018-03-27 ENCOUNTER — Ambulatory Visit: Payer: Medicare Other | Attending: Neurology | Admitting: Physical Therapy

## 2018-03-27 ENCOUNTER — Encounter: Payer: Self-pay | Admitting: Physical Therapy

## 2018-03-27 ENCOUNTER — Encounter: Payer: Self-pay | Admitting: Internal Medicine

## 2018-03-27 ENCOUNTER — Ambulatory Visit (HOSPITAL_BASED_OUTPATIENT_CLINIC_OR_DEPARTMENT_OTHER)
Admission: RE | Admit: 2018-03-27 | Discharge: 2018-03-27 | Disposition: A | Discharge status: Home / Self Care | Payer: Medicare Other | Source: Ambulatory Visit | Attending: Internal Medicine | Admitting: Internal Medicine

## 2018-03-27 ENCOUNTER — Ambulatory Visit (INDEPENDENT_AMBULATORY_CARE_PROVIDER_SITE_OTHER): Payer: Medicare Other | Admitting: Internal Medicine

## 2018-03-27 VITALS — BP 132/72 | HR 60

## 2018-03-27 VITALS — BP 128/70 | HR 75 | Temp 97.5°F | Resp 16 | Ht 71.0 in | Wt 254.0 lb

## 2018-03-27 DIAGNOSIS — R2681 Unsteadiness on feet: Secondary | ICD-10-CM | POA: Diagnosis not present

## 2018-03-27 DIAGNOSIS — J189 Pneumonia, unspecified organism: Secondary | ICD-10-CM | POA: Diagnosis not present

## 2018-03-27 DIAGNOSIS — A419 Sepsis, unspecified organism: Secondary | ICD-10-CM

## 2018-03-27 DIAGNOSIS — M545 Low back pain, unspecified: Secondary | ICD-10-CM

## 2018-03-27 DIAGNOSIS — R29898 Other symptoms and signs involving the musculoskeletal system: Secondary | ICD-10-CM | POA: Insufficient documentation

## 2018-03-27 DIAGNOSIS — G8929 Other chronic pain: Secondary | ICD-10-CM

## 2018-03-27 DIAGNOSIS — M5442 Lumbago with sciatica, left side: Secondary | ICD-10-CM | POA: Insufficient documentation

## 2018-03-27 LAB — CBC WITH DIFFERENTIAL/PLATELET
Basophils Absolute: 0.1 10*3/uL (ref 0.0–0.1)
Basophils Relative: 1.1 % (ref 0.0–3.0)
Eosinophils Absolute: 0.3 10*3/uL (ref 0.0–0.7)
Eosinophils Relative: 5.3 % — ABNORMAL HIGH (ref 0.0–5.0)
HCT: 44.1 % (ref 39.0–52.0)
Hemoglobin: 14.6 g/dL (ref 13.0–17.0)
Lymphocytes Relative: 30.1 % (ref 12.0–46.0)
Lymphs Abs: 1.5 10*3/uL (ref 0.7–4.0)
MCHC: 33 g/dL (ref 30.0–36.0)
MCV: 88.1 fl (ref 78.0–100.0)
MONOS PCT: 7.5 % (ref 3.0–12.0)
Monocytes Absolute: 0.4 10*3/uL (ref 0.1–1.0)
Neutro Abs: 2.7 10*3/uL (ref 1.4–7.7)
Neutrophils Relative %: 56 % (ref 43.0–77.0)
Platelets: 130 10*3/uL — ABNORMAL LOW (ref 150.0–400.0)
RBC: 5.01 Mil/uL (ref 4.22–5.81)
RDW: 14.1 % (ref 11.5–15.5)
WBC: 4.9 10*3/uL (ref 4.0–10.5)

## 2018-03-27 LAB — COMPREHENSIVE METABOLIC PANEL
ALT: 52 U/L (ref 0–53)
AST: 33 U/L (ref 0–37)
Albumin: 4.5 g/dL (ref 3.5–5.2)
Alkaline Phosphatase: 47 U/L (ref 39–117)
BUN: 16 mg/dL (ref 6–23)
CO2: 30 mEq/L (ref 19–32)
Calcium: 9.5 mg/dL (ref 8.4–10.5)
Chloride: 107 mEq/L (ref 96–112)
Creatinine, Ser: 0.98 mg/dL (ref 0.40–1.50)
GFR: 75.63 mL/min (ref >60.00)
Glucose, Bld: 86 mg/dL (ref 70–99)
POTASSIUM: 4.8 meq/L (ref 3.5–5.1)
Sodium: 142 mEq/L (ref 135–145)
Total Bilirubin: 0.8 mg/dL (ref 0.2–1.2)
Total Protein: 6.6 g/dL (ref 6.0–8.3)

## 2018-03-27 NOTE — Progress Notes (Signed)
Subjective:    Patient ID: Dale [name], male    DOB: 1950/07/24, 68 y.o.   MRN: [id number]  DOS:  03/27/2018 Type of visit - description: Routine visit Was admitted to the hospital 02/22/2018, had fever, cough, shortness of breath. Chest x-ray: Patchy bibasilar airspace opacities concerning for aspiration and/or pneumonia.   Dx  with sepsis, other issues were leukopenia, thrombocytopenia. Last potassium 3.7, creatinine 0.8. Labs White count 3.2, hemoglobin 13.5, platelets 106  We also discussed his chronic medical problems.  Back pain: Chart reviewed  Review of Systems Currently doing well, he feels he is fully recovered from the pneumonia. Denies fever chills.  No chest pain no difficulty breathing.  No cough. He actually feels great, has some chronic GI issues resolve after he was admitted to the hospital and given antibiotics.  Past Medical History:  Diagnosis Date  . BCC (basal cell carcinoma of skin)    dr Allyson Sabal  . BPH (benign prostatic hyperplasia)   . Decreased hearing    Left, saw audiologist in 2011 per pt   . DJD (degenerative joint disease)   . Elevated liver function tests   . Hyperlipidemia    mixed  . Hypertension   . Obesity   . Ocular rosacea    on doxy as of 01-2013  . OSA (obstructive sleep apnea)    on CPAP  . Tubular adenoma of colon 2017    Past Surgical History:  Procedure Laterality Date  . COLONOSCOPY W/ POLYPECTOMY  10/20/2015   tubular adenoma  . TONSILLECTOMY      Social History   Socioeconomic History  . Marital status: Married    Spouse name: Not on file  . Number of children: 3  . Years of education: Not on file  . Highest education level: Professional school degree (e.g., MD, DDS, DVM, JD)  Occupational History  . Occupation: Pharmacist, community  Social Needs  . Financial resource strain: Not on file  . Food insecurity:    Worry: Not on file    Inability: Not on file  . Transportation needs:    Medical: Not on file   Non-medical: Not on file  Tobacco Use  . Smoking status: Former Smoker    Last attempt to quit: 11/21/1968    Years since quitting: 49.3  . Smokeless tobacco: Never Used  . Tobacco comment: quit late 70s  Substance and Sexual Activity  . Alcohol use: Yes    Comment: socially  . Drug use: No  . Sexual activity: Not on file  Lifestyle  . Physical activity:    Days per week: Not on file    Minutes per session: Not on file  . Stress: Not on file  Relationships  . Social connections:    Talks on phone: Not on file    Gets together: Not on file    Attends religious service: Not on file    Active member of club or organization: Not on file    Attends meetings of clubs or organizations: Not on file    Relationship status: Not on file  . Intimate partner violence:    Fear of current or ex partner: Not on file    Emotionally abused: Not on file    Physically abused: Not on file    Forced sexual activity: Not on file  Other Topics Concern  . Not on file  Social History Narrative   Lives with his wife in a 2 story home with a basement.  Has  3 children.  Works as a Pharmacist, community.  Education: Associate Professor.       Allergies as of 03/27/2018   No Known Allergies     Medication List       Accurate as of March 27, 2018  8:36 AM. Always use your most recent med list.        aspirin 81 MG tablet Take 81 mg by mouth daily.   atorvastatin 10 MG tablet Commonly known as:  LIPITOR Take 1 tablet (10 mg total) by mouth at bedtime.   fish oil-omega-3 fatty acids 1000 MG capsule Take 1 g by mouth daily.   ibuprofen 800 MG tablet Commonly known as:  ADVIL,MOTRIN Take 800 mg by mouth every 8 (eight) hours as needed.   tamsulosin 0.4 MG Caps capsule Commonly known as:  FLOMAX Take 0.8 mg by mouth daily.   Vitamin D 1000 units capsule Take 1,000 Units by mouth daily.           Objective:   Physical Exam BP 128/70 (BP Location: Left Arm, Patient Position: Sitting, Cuff Size: Normal)    Pulse 75   Temp (!) 97.5 F (36.4 C) (Oral)   Resp 16   Ht 5\' 11"  (1.803 m)   Wt 254 lb (115.2 kg)   SpO2 98%   BMI 35.43 kg/m  General:   Well developed, NAD, BMI noted. HEENT:  Normocephalic . Face symmetric, atraumatic Lungs:  CTA B Normal respiratory effort, no intercostal retractions, no accessory muscle use. Heart: RRR,  no murmur.  No pretibial edema bilaterally  Skin: Not pale. Not jaundice Neurologic:  alert & oriented X3.  Speech normal, gait appropriate for age and unassisted Psych--  Cognition and judgment appear intact.  Cooperative with normal attention span and concentration.  Behavior appropriate. No anxious or depressed appearing.      Assessment      Assessment Elevated BP Hyperlipidemia Elevated LFTs DJD Morbid obesity: BMI 36 + OSA OSA, + CPAP BCC, Dr Allyson Sabal Ocular rosacea HOH, L   PLAN: Pneumonia, sepsis: Admitted last month, has fully recovered.  We will check a chest x-ray, CBC and CMP. Back pain: Since the last visit saw neurology, note reviewed. MRI was ordered, showing stenoses of the lumbar spine NCS:01/29/2018:Chronic L3 radiculopathy affecting bilateral lower extremities, mild in degree electrically.  There is no evidence of a large fiber sensorimotor polyneuropathy. After physical therapy, he got better.  Takes ibuprofen with GI precautions, good tolerance. Neuropathy?:  No evidence of neuropathy, see NCS Aspirin: pt decided to self d/c  RTC 5 months routine checkup

## 2018-03-27 NOTE — Progress Notes (Signed)
Pre visit review using our clinic review tool, if applicable. No additional management support is needed unless otherwise documented below in the visit note. 

## 2018-03-27 NOTE — Therapy (Signed)
[facility] [address]  [place], [place], [address] Phone: [phone]   Fax:  [phone]  Physical Therapy Treatment  Patient Details  Name: Dale [name] MRN: [id number] Date of Birth: [birth year] Referring Provider (PT): Narda Amber, MD   Encounter Date: 03/27/2018  PT End of Session - 03/27/18 1013    Visit Number  2    Number of Visits  7    Date for PT Re-Evaluation  05/01/18    Authorization Type  Medicare & [employer]    PT Start Time  0930    PT Stop Time  1012    PT Time Calculation (min)  42 min    Activity Tolerance  Patient tolerated treatment well    Behavior During Therapy  [facility] for tasks assessed/performed       Past Medical History:  Diagnosis Date  . BCC (basal cell carcinoma of skin)    dr Allyson Sabal  . BPH (benign prostatic hyperplasia)   . Decreased hearing    Left, saw audiologist in 2011 per pt   . DJD (degenerative joint disease)   . Elevated liver function tests   . Hyperlipidemia    mixed  . Hypertension   . Obesity   . Ocular rosacea    on doxy as of 01-2013  . OSA (obstructive sleep apnea)    on CPAP  . Tubular adenoma of colon 2017    Past Surgical History:  Procedure Laterality Date  . COLONOSCOPY W/ POLYPECTOMY  10/20/2015   tubular adenoma  . TONSILLECTOMY      Vitals:   03/27/18 0931  BP: 132/72  Pulse: 60  SpO2: 98%    Subjective Assessment - 03/27/18 0934    Subjective  Patient reports that his LB has been bothering him more since last session, not sure why. Admits that he has lost his HEP handout and has not given them a try.     Pertinent History  HTN, HLD, DJD, decreased hearing, basal cell carcinoma with excision     Diagnostic tests  NCS/EMG of the legs 01/29/2018: Chronic L3 radiculopathy affecting bilateral lower extremities, mild in degree electrically. 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  Yes    Pain Score  3     Pain Location  Back    Pain Orientation  Posterior    Pain Descriptors / Indicators  Stabbing    Pain Type  Chronic pain         [facility] PT Assessment - 03/27/18 0001      AROM   AROM Assessment Site  Ankle    Right/Left Ankle  Right;Left    Right Ankle Dorsiflexion  11    Left Ankle Dorsiflexion  8      Functional Gait  Assessment   Gait assessed   Yes    Gait Level Surface  Walks 20 ft in less than 5.5 sec, no assistive devices, good speed, no evidence for imbalance, normal gait pattern, deviates no more than 6 in outside of the 12 in walkway width.    Change in Gait Speed  Able to smoothly change walking speed without loss of balance or gait  deviation. Deviate no more than 6 in outside of the 12 in walkway width.    Gait with Horizontal Head Turns  Performs head turns smoothly with slight change in gait velocity (eg, minor disruption to smooth gait path), deviates 6-10 in outside 12 in walkway width, or uses an assistive device.    Gait with Vertical Head Turns  Performs head turns with no change in gait. Deviates no more than 6 in outside 12 in walkway width.    Gait and Pivot Turn  Pivot turns safely within 3 sec and stops quickly with no loss of balance.    Step Over Obstacle  Is able to step over 2 stacked shoe boxes taped together (9 in total height) without changing gait speed. No evidence of imbalance.    Gait with Narrow Base of Support  Is able to ambulate for 10 steps heel to toe with no staggering.    Gait with Eyes Closed  Walks 20 ft, no assistive devices, good speed, no evidence of imbalance, normal gait pattern, deviates no more than 6 in outside 12 in walkway width. Ambulates 20 ft in less than 7 sec.    Ambulating Backwards  Walks 20  ft, no assistive devices, good speed, no evidence for imbalance, normal gait    Steps  Alternating feet, no rail.    Total Score  29                   [facility] Adult PT Treatment/Exercise - 03/27/18 0001      Exercises   Exercises  Lumbar;Knee/Hip      Lumbar Exercises: Aerobic   Nustep  L4 x 6 min UEs/LEs      Lumbar Exercises: Supine   Pelvic Tilt  15 reps    Pelvic Tilt Limitations  manual cues for proper form    Bridge  10 reps    Bridge Limitations  cues to avoid valsalva      Knee/Hip Exercises: Stretches   Human resources officer Limitations  at counter top    Other Knee/Hip Stretches  R & L KTOS x30"      Knee/Hip Exercises: Standing   Hip Flexion  Stengthening;Both;1 set;20 reps;Knee bent    Hip Flexion Limitations  alt marching with yellow TB around toes at counter top      Knee/Hip Exercises: Supine   Other Supine Knee/Hip Exercises  LTR x20 to tolerance             PT Education - 03/27/18 1012    Education Details  re-administered HEP and added gastroc stretch    Person(s) Educated  Patient    Methods  Explanation;Demonstration;Tactile cues;Verbal cues;Handout    Comprehension  Verbalized understanding;Returned demonstration       PT Short Term Goals - 03/27/18 1156      PT SHORT TERM GOAL #1   Title  Patient to be independent with initial HEP.    Time  3    Period  Weeks    Status  On-going    Target Date  04/10/18        PT Long Term Goals - 03/27/18 1156      PT LONG TERM GOAL #1   Title  Patient to be independent with advanced HEP.    Time  6    Period  Weeks    Status  On-going      PT LONG TERM GOAL #2   Title  Patient to demonstrate [facility] lumbar AROM without pain limiting.     Time  6    Period  Weeks    Status  On-going      PT LONG TERM GOAL #3   Title  Patient to demonstrate bending forward to pick up object from floor without pain limiting.     Time  6    Period  Weeks     Status  On-going      PT LONG TERM GOAL #4   Title  Patient to report tolerance of putting on shoes without pain limiting.     Time  6    Period  Weeks    Status  On-going      PT LONG TERM GOAL #5   Title  Patient to score >22/30 on FGA in order to decrease risk of falls.     Time  6    Period  Weeks    Status  Achieved   29/30           Plan - 03/27/18 1150    Clinical Impression Statement  Patient arrived to session reporting increased LBP since last seen, however admitted to losing his HEP handout and not performing his exercises at home. Assessed FGA at beginning of session- with patient scoring 29/30, indicating decreased risk of falls. However, patient reporting that he felt unsteady with this test, and cited to several times that he caught his L heel, resulting in a fall. Assessed B ankle DF, which showed decreased ROM on L ankle. Previous strength measurements also show decreased L hip flexion strength. Thus, focused on L calf stretching and hip flexor strengthening this session. Also reviewed previous HEP with cues for form and advised patient not to push into pain with these movements. Re-administered previous HEP and added gastroc stretch. Advised patient to perform HEP 2x/day for max benefit. Patient reported understanding. No complaints at end of session.     Clinical Impairments Affecting Rehab Potential  HTN, HLD, DJD, decreased hearing, basal cell carcinoma with excision     PT Treatment/Interventions  ADLs/Self Care Home Management;Cryotherapy;Electrical Stimulation;Functional mobility training;Stair training;Gait training;DME Instruction;Ultrasound;Traction;Therapeutic activities;Therapeutic exercise;Balance training;Neuromuscular re-education;Patient/family education;Passive range of motion;Manual techniques;Dry needling;Energy conservation;Splinting;Taping    PT Next Visit Plan  progress hip flexor and core strengthening     Consulted and Agree with Plan of Care   Patient       Patient will benefit from skilled therapeutic intervention in order to improve the following deficits and impairments:  Hypomobility, Decreased activity tolerance, Decreased strength, Pain, Decreased balance, Decreased range of motion, Improper body mechanics, Postural dysfunction, Impaired flexibility  Visit Diagnosis: Chronic midline low back pain with left-sided sciatica  Other symptoms and signs involving the musculoskeletal system  Unsteadiness on feet     Problem List Patient Active Problem List   Diagnosis Date Noted  . PCP NOTES >>>>>>>>>>>>> 08/18/2015  . BCC (basal cell carcinoma of skin)   . Ocular rosacea   . Annual physical exam 08/31/2010  . NOCTURIA 09/22/2009  . Obstructive sleep apnea 09/15/2009  . DECREASED HEARING 06/13/2009  . Elevated LFTs 12/09/2008  . DJD (degenerative joint disease) 06/03/2008  . Dyslipidemia 05/11/2008  . Morbid obesity ([place]) 05/11/2008  . Elevated blood pressure reading without diagnosis of hypertension 05/11/2008     Janene Harvey, PT, DPT 03/27/18 11:58 AM   [facility] [address]  [place], [place], [address] Phone: [phone]   Fax:  [phone]  Name: DEMETRIA LIGHTSEY MRN: [id number] Date of Birth: 1950/06/26

## 2018-03-27 NOTE — Patient Instructions (Addendum)
Please schedule Medicare Wellness with Glenard Haring.    GO TO THE LAB : Get the blood work     GO TO THE FRONT DESK Schedule your next appointment  For a check up by 08/2018

## 2018-03-27 NOTE — Assessment & Plan Note (Addendum)
Pneumonia, sepsis: Admitted last month, has fully recovered.  We will check a chest x-ray, CBC and CMP. Back pain: Since the last visit saw neurology, note reviewed. MRI was ordered, showing stenoses of the lumbar spine NCS:01/29/2018:Chronic L3 radiculopathy affecting bilateral lower extremities, mild in degree electrically.  There is no evidence of a large fiber sensorimotor polyneuropathy. After physical therapy, he got better.  Takes ibuprofen with GI precautions, good tolerance. Neuropathy?:  No evidence of neuropathy, see NCS Aspirin: pt decided to self d/c  RTC 5 months routine checkup

## 2018-04-03 ENCOUNTER — Ambulatory Visit: Payer: Medicare Other

## 2018-04-03 ENCOUNTER — Other Ambulatory Visit: Payer: Self-pay

## 2018-04-03 DIAGNOSIS — M5442 Lumbago with sciatica, left side: Principal | ICD-10-CM

## 2018-04-03 DIAGNOSIS — R2681 Unsteadiness on feet: Secondary | ICD-10-CM

## 2018-04-03 DIAGNOSIS — R29898 Other symptoms and signs involving the musculoskeletal system: Secondary | ICD-10-CM | POA: Diagnosis not present

## 2018-04-03 DIAGNOSIS — G8929 Other chronic pain: Secondary | ICD-10-CM | POA: Diagnosis not present

## 2018-04-03 NOTE — Therapy (Addendum)
[facility] [address]  [place], [place], [address] Phone: [phone]   Fax:  [phone]  Physical Therapy Treatment  Patient Details  Name: LENNY FIUMARA MRN: [id number] Date of Birth: 19-Feb-1950 Referring Provider (PT): Narda Amber, MD    Progress Note Reporting Period 03/20/18 to 04/03/18  See note below for Objective Data and Assessment of Progress/Goals.    Encounter Date: 04/03/2018  PT End of Session - 04/03/18 0934    Visit Number  3    Number of Visits  7    Date for PT Re-Evaluation  05/01/18    Authorization Type  Med[employer]are & [employer]    PT Start Time  [phone]    PT Stop Time  1016    PT Time Calculation (min)  45 min    Activity Tolerance  Patient tolerated treatment well    Behavior During Therapy  [facility] for tasks assessed/performed       Past Medical History:  Diagnosis Date  . BCC (basal cell carcinoma of skin)    dr Allyson Sabal  . BPH (benign prostatic hyperplasia)   . Decreased hearing    Left, saw audiologist in 2011 per pt   . DJD (degenerative joint disease)   . Elevated liver function tests   . Hyperlipidemia    mixed  . Hypertension   . Obesity   . Ocular rosacea    on doxy as of 01-2013  . OSA (obstructive sleep apnea)    on CPAP  . Tubular adenoma of colon 2017    Past Surgical History:  Procedure Laterality Date  . COLONOSCOPY W/ POLYPECTOMY  10/20/2015   tubular adenoma  . TONSILLECTOMY      There were no vitals filed for this visit.  Subjective Assessment - 04/03/18 0946    Subjective  Pt. reporting he wishes to review HEP and feeling like calf stretch has irritated his L ankle thus wants to review.      Pertinent History  HTN, HLD, DJD, decreased hearing, basal cell carcinoma with excision     Diagnostic tests  NCS/EMG of the legs 01/29/2018: Chronic L3 radiculopathy affecting bilateral lower extremities, mild in degree electrically. 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  No/denies    Pain Score  0-No pain   Pt. noting most recent LBP up to 3/10   Pain Location  Back    Pain Orientation  Posterior    Pain Descriptors / Indicators  Stabbing    Pain Type  Chronic pain    Multiple Pain Sites  No                       [facility] Adult PT Treatment/Exercise - 04/03/18 0950      Ambulation/Gait   Stairs  Yes    Stairs Assistance  6: Modified independent (Device/Increase time)    Stair Management Technique  One rail Right;Alternating pattern    Number of Stairs  14    Height of Stairs  7    Gait Comments  Pt. able to navigate stairs with reciprocal pattern with one rail and good overall technique and control; did not note  pt. "catching heel" on step as pt. verbalized he has an issue with      Neuro Re-ed    Neuro Re-ed Details   backwards tandem walk x 3 laps at counter      Lumbar Exercises: Stretches   Single Knee to Chest Stretch  Right;Left;1 rep;30 seconds    Single Knee to Chest Stretch Limitations  B    Lower Trunk Rotation  --   5" x 10 reps   Lumbar Stabilization Level 1  3 reps;20 seconds    Lumbar Stabilization Level 1 Limitations  seated green p-ball rollouts     Piriformis Stretch  Right;Left;1 rep;30 seconds    Piriformis Stretch Limitations  KTOS      Lumbar Exercises: Aerobic   Nustep  L4 x 6 min UEs/LEs      Lumbar Exercises: Supine   Clam  15 reps;3 seconds    Clam Limitations  Hooklying alternating hip abd/ER into red TB at knees    Bridge  --   x 12 reps    Bridge Limitations  Cues to avoid valsalva      Knee/Hip Exercises: Theme park manager Limitations  cues for gentle stretch to avoid  ankle pain    Other Knee/Hip Stretches  R & L KTOS 2 x 30"      Knee/Hip Exercises: Standing   SLS with Vectors  B SLS with opposite toe-touch to cones 5 x 3 cones each LE; light occasional UE support             PT Education - 04/03/18 1046    Education Details  HEP update    Person(s) Educated  Patient    Methods  Explanation;Demonstration;Verbal cues;Handout    Comprehension  Verbalized understanding;Returned demonstration;Verbal cues required;Need further instruction       PT Short Term Goals - 04/03/18 0955      PT SHORT TERM GOAL #1   Title  Patient to be independent with initial HEP.    Time  3    Period  Weeks    Status  Achieved    Target Date  04/10/18        PT Long Term Goals - 03/27/18 1156      PT LONG TERM GOAL #1   Title  Patient to be independent with advanced HEP.    Time  6    Period  Weeks    Status  On-going      PT LONG TERM GOAL #2   Title  Patient to demonstrate [facility] lumbar AROM without pain limiting.     Time  6    Period  Weeks    Status  On-going      PT LONG TERM GOAL #3   Title  Patient to demonstrate bending forward to pick up object from floor without pain limiting.     Time  6    Period  Weeks    Status  On-going      PT LONG TERM GOAL #4   Title  Patient to report tolerance of putting on shoes without pain limiting.     Time  6    Period  Weeks    Status  On-going      PT LONG TERM GOAL #5   Title  Patient to score >22/30 on FGA in order to decrease risk of falls.     Time  6    Period  Weeks    Status  Achieved   29/30           Plan - 04/03/18 0934    Clinical Impression Statement  Saralyn Pilar doing well today.  Asked to review HEP due to wanting to have his technique checked and due to feeling some ankle pain with standing calf stretch at home.  Able to perform all HEP activities, including standing calf stretch, only requiring min cueing to avoid overpressure into stretch for improved tolerance.  Session  focused on lumbar ROM and LE stretching with pt. tolerating well.  updated HEP.  Ended visit pain free.  STG now met.      Rehab Potential  Good    Clinical Impairments Affecting Rehab Potential  HTN, HLD, DJD, decreased hearing, basal cell carcinoma with excision     PT Treatment/Interventions  ADLs/Self Care Home Management;Cryotherapy;Electrical Stimulation;Functional mobility training;Stair training;Gait training;DME Instruction;Ultrasound;Traction;Therapeutic activities;Therapeutic exercise;Balance training;Neuromuscular re-education;Patient/family education;Passive range of motion;Manual techniques;Dry needling;Energy conservation;Splinting;Taping    PT Next Visit Plan  progress hip flexor and core strengthening     Consulted and Agree with Plan of Care  Patient       Patient will benefit from skilled therapeutic intervention in order to improve the following deficits and impairments:  Hypomobility, Decreased activity tolerance, Decreased strength, Pain, Decreased balance, Decreased range of motion, Improper body mechanics, Postural dysfunction, Impaired flexibility  Visit Diagnosis: Chronic midline low back pain with left-sided sciatica  Other symptoms and signs involving the musculoskeletal system  Unsteadiness on feet     Problem List Patient Active Problem List   Diagnosis Date Noted  . PCP NOTES >>>>>>>>>>>>> 08/18/2015  . BCC (basal cell carcinoma of skin)   . Ocular rosacea   . Annual physical exam 08/31/2010  . NOCTURIA 09/22/2009  . Obstructive sleep apnea 09/15/2009  . DECREASED HEARING 06/13/2009  . Elevated LFTs 12/09/2008  . DJD (degenerative joint disease) 06/03/2008  . Dyslipidemia 05/11/2008  . Morbid obesity ([place]) 05/11/2008  . Elevated blood pressure reading without diagnosis of hypertension 05/11/2008    Bess Harvest, PTA 04/03/18 10:52 AM   [facility] [address]  [place],  [place], [address] Phone: [phone]   Fax:  [phone]  Name: ROQUE SCHILL MRN: [id number] Date of Birth: 05-28-1950  PHYSICAL THERAPY DISCHARGE SUMMARY  Visits from Start of Care: 3  Current functional level related to goals / functional outcomes: Unable to assess; patient did not return d/t COVID-19 concern    Remaining deficits: Unable to assess   Education / Equipment: HEP  Plan: Patient agrees to discharge.  Patient goals were not met. Patient is being discharged due to not returning since the last visit.  ?????     Janene Harvey, PT, DPT 05/04/18 1:42 PM

## 2018-04-10 ENCOUNTER — Encounter: Payer: Medicare Other | Admitting: Physical Therapy

## 2018-05-08 ENCOUNTER — Encounter: Payer: Medicare Other | Admitting: Physical Therapy

## 2018-08-06 ENCOUNTER — Other Ambulatory Visit: Payer: Self-pay | Admitting: Internal Medicine

## 2018-08-28 ENCOUNTER — Ambulatory Visit (INDEPENDENT_AMBULATORY_CARE_PROVIDER_SITE_OTHER): Payer: Medicare Other | Admitting: Internal Medicine

## 2018-08-28 ENCOUNTER — Other Ambulatory Visit: Payer: Self-pay

## 2018-08-28 ENCOUNTER — Encounter: Payer: Self-pay | Admitting: Internal Medicine

## 2018-08-28 VITALS — BP 132/67 | HR 55 | Temp 97.9°F | Resp 18 | Ht 71.0 in | Wt 255.1 lb

## 2018-08-28 DIAGNOSIS — M159 Polyosteoarthritis, unspecified: Secondary | ICD-10-CM

## 2018-08-28 DIAGNOSIS — E785 Hyperlipidemia, unspecified: Secondary | ICD-10-CM | POA: Diagnosis not present

## 2018-08-28 DIAGNOSIS — M150 Primary generalized (osteo)arthritis: Secondary | ICD-10-CM

## 2018-08-28 DIAGNOSIS — G4733 Obstructive sleep apnea (adult) (pediatric): Secondary | ICD-10-CM

## 2018-08-28 LAB — LIPID PANEL
Cholesterol: 147 mg/dL (ref 0–200)
HDL: 39 mg/dL — ABNORMAL LOW (ref >39.00)
LDL Cholesterol: 82 mg/dL (ref 0–99)
NonHDL: 108.09
Total CHOL/HDL Ratio: 4
Triglycerides: 132 mg/dL (ref 0.0–149.0)
VLDL: 26.4 mg/dL (ref 0.0–40.0)

## 2018-08-28 MED ORDER — VALACYCLOVIR HCL 1 G PO TABS: 1000.0000 mg | ORAL_TABLET | Freq: Three times a day (TID) | ORAL | 0 refills | Status: DC

## 2018-08-28 MED ORDER — SHINGRIX 50 MCG/0.5ML IM SUSR: 0.5000 mL | Freq: Once | INTRAMUSCULAR | 1 refills | Status: AC

## 2018-08-28 MED ORDER — TETANUS-DIPHTH-ACELL PERTUSSIS 5-2.5-18.5 LF-MCG/0.5 IM SUSP: 0.5000 mL | Freq: Once | INTRAMUSCULAR | 0 refills | Status: AC

## 2018-08-28 NOTE — Assessment & Plan Note (Signed)
Early flu shot this season recommended Tdap rx printed shingrix printed [facility] [phone], recommended follow-up in 1 year

## 2018-08-28 NOTE — Progress Notes (Signed)
Subjective:    Patient ID: Dale [name], male    DOB: 1950-08-28, 67 y.o.   MRN: [id number]  DOS:  08/28/2018 Type of visit - description: Routine visit BPH: Urology note reviewed MSK: Few weeks ago after he bent over had a severe pain at the left gluteal area, symptoms since then have resolved High cholesterol: Good compliance with medications Rash: About a week ago and the right middle back had pain described as an electrical shock follow-up by a rash described as several vesicles.  Since then he is better.  Shingles?    Review of Systems No fever chills No cough Some stress related to work on COVID-19  Past Medical History:  Diagnosis Date  . BCC (basal cell carcinoma of skin)    dr Allyson Sabal  . BPH (benign prostatic hyperplasia)   . Decreased hearing    Left, saw audiologist in 2011 per pt   . DJD (degenerative joint disease)   . Elevated liver function tests   . Hyperlipidemia    mixed  . Hypertension   . Obesity   . Ocular rosacea    on doxy as of 01-2013  . OSA (obstructive sleep apnea)    on CPAP  . Tubular adenoma of colon 2017    Past Surgical History:  Procedure Laterality Date  . COLONOSCOPY W/ POLYPECTOMY  10/20/2015   tubular adenoma  . TONSILLECTOMY      Social History   Socioeconomic History  . Marital status: Married    Spouse name: Not on file  . Number of children: 3  . Years of education: Not on file  . Highest education level: Professional school degree (e.g., MD, DDS, DVM, JD)  Occupational History  . Occupation: Pharmacist, community  Social Needs  . Financial resource strain: Not on file  . Food insecurity    Worry: Not on file    Inability: Not on file  . Transportation needs    Medical: Not on file    Non-medical: Not on file  Tobacco Use  . Smoking status: Former Smoker    Quit date: 11/21/1968    Years since quitting: 49.8  . Smokeless tobacco: Never Used  . Tobacco comment: quit late 70s  Substance and Sexual Activity  . Alcohol  use: Yes    Comment: socially  . Drug use: No  . Sexual activity: Not on file  Lifestyle  . Physical activity    Days per week: Not on file    Minutes per session: Not on file  . Stress: Not on file  Relationships  . Social Herbalist on phone: Not on file    Gets together: Not on file    Attends religious service: Not on file    Active member of club or organization: Not on file    Attends meetings of clubs or organizations: Not on file    Relationship status: Not on file  . Intimate partner violence    Fear of current or ex partner: Not on file    Emotionally abused: Not on file    Physically abused: Not on file    Forced sexual activity: Not on file  Other Topics Concern  . Not on file  Social History Narrative   Lives with his wife in a 2 story home with a basement.  Has 3 children.  Works as a Pharmacist, community.  Education: Associate Professor.       Allergies as of 08/28/2018   No Known Allergies  Medication List       Accurate as of August 28, 2018  4:37 PM. If you have any questions, ask your nurse or doctor.        atorvastatin 10 MG tablet Commonly known as: LIPITOR Take 1 tablet (10 mg total) by mouth at bedtime.   fish oil-omega-3 fatty acids 1000 MG capsule Take 1 g by mouth daily.   ibuprofen 800 MG tablet Commonly known as: ADVIL Take 800 mg by mouth every 8 (eight) hours as needed.   Shingrix injection Generic drug: Zoster Vaccine Adjuvanted Inject 0.5 mLs into the muscle once for 1 dose. Started by: Kathlene November, MD   tamsulosin 0.4 MG Caps capsule Commonly known as: FLOMAX Take 0.8 mg by mouth daily.   Tdap 5-2.5-18.5 LF-MCG/0.5 injection Commonly known as: BOOSTRIX Inject 0.5 mLs into the muscle once for 1 dose. Started by: Kathlene November, MD   valACYclovir 1000 MG tablet Commonly known as: Valtrex Take 1 tablet (1,000 mg total) by mouth 3 (three) times daily. Started by: Kathlene November, MD   Vitamin D 1000 units capsule Take 1,000 Units by mouth  daily.           Objective:   Physical Exam Skin:        BP 132/67 (BP Location: Left Arm, Patient Position: Sitting, Cuff Size: Normal)   Pulse (!) 55   Temp 97.9 F (36.6 C) (Oral)   Resp 18   Ht 5\' 11"  (1.803 m)   Wt 255 lb 2 oz (115.7 kg)   SpO2 100%   BMI 35.58 kg/m  General:   Well developed, NAD, BMI noted. HEENT:  Normocephalic . Face symmetric, atraumatic Lungs:  CTA B Normal respiratory effort, no intercostal retractions, no accessory muscle use. Heart: RRR,  no murmur.  No pretibial edema bilaterally  Skin: Back with single blister.  See graphic Neurologic:  alert & oriented X3.  Speech normal, gait appropriate for age and unassisted Psych--  Cognition and judgment appear intact.  Cooperative with normal attention span and concentration.  Behavior appropriate. No anxious or depressed appearing.      Assessment    Assessment Elevated BP Hyperlipidemia Elevated LFTs DJD Morbid obesity: BMI 36 + OSA OSA, + CPAP BCC, used to see Dr Allyson Sabal   Ocular rosacea HOH, L   PLAN: Hyperlipidemia: On Lipitor, last LFTs normal troponin, check FLP DJD, MSK: Back pain: Again we discussed the importance of staying active, stretching, yoga? OSA: Good CPAP compliance BCC: History of, has not seen dermatology recently, recommend to see the dermatologist of his choice and call for a referral if needed Shingles: See HPI, suspect shingles, mild case.  He still have a blister, will prescribe Valtrex, call if symptoms resurface. Morbid obesity: Diet discussed Preventive care discussed RTC 6 to 9 months

## 2018-08-28 NOTE — Assessment & Plan Note (Addendum)
Hyperlipidemia: On Lipitor, last LFTs normal troponin, check FLP DJD, MSK: Back pain: Again we discussed the importance of staying active, stretching, yoga? OSA: Good CPAP compliance BCC: History of, has not seen dermatology recently, recommend to see the dermatologist of his choice and call for a referral if needed Shingles: See HPI, suspect shingles, mild case.  He still have a blister, will prescribe Valtrex, call if symptoms resurface. Morbid obesity: Diet discussed Preventive care discussed RTC 6 to 9 months

## 2018-08-28 NOTE — Progress Notes (Signed)
Pre visit review using our clinic review tool, if applicable. No additional management support is needed unless otherwise documented below in the visit note. 

## 2018-08-28 NOTE — Patient Instructions (Addendum)
Please schedule Medicare Wellness with Glenard Haring.  GO TO THE LAB : Get the blood work     GO TO THE FRONT DESK Schedule your next appointment   for a checkup in 6 to 8 months  Take Valtrex as prescribed for shingles  Take tetanus shot at the pharmacy at your convenience  Take the shingles shot (Shingrix) in few weeks  Early flu shot this fall

## 2018-10-08 ENCOUNTER — Ambulatory Visit (INDEPENDENT_AMBULATORY_CARE_PROVIDER_SITE_OTHER): Payer: Medicare Other | Admitting: Neurology

## 2018-10-08 ENCOUNTER — Other Ambulatory Visit: Payer: Self-pay

## 2018-10-08 ENCOUNTER — Encounter: Payer: Self-pay | Admitting: Neurology

## 2018-10-08 VITALS — BP 170/78 | HR 68 | Ht 70.5 in | Wt 259.0 lb

## 2018-10-08 DIAGNOSIS — G4733 Obstructive sleep apnea (adult) (pediatric): Secondary | ICD-10-CM | POA: Diagnosis not present

## 2018-10-08 DIAGNOSIS — R0683 Snoring: Secondary | ICD-10-CM

## 2018-10-08 DIAGNOSIS — Z9989 Dependence on other enabling machines and devices: Secondary | ICD-10-CM | POA: Diagnosis not present

## 2018-10-08 NOTE — Progress Notes (Signed)
Subjective:    Patient ID: GABRIELLA GUILE is a 68 y.o. male.  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  The patient today.  I last saw him on 10/02/2017, at which time he had started using his new CPAP machine.  He was compliant with treatment.  He was advised to follow-up yearly.    Today, 10/08/2018: I reviewed his CPAP compliance data from 09/07/2018 through 10/06/2018 which is a total of 30 days, during which time he used his machine every night with percent use days greater than 4 hours at 100%, indicating superb compliance with an average usage of 7 hours and 49 minutes, residual AHI 0.5/h, at goal, leak on the low side with a 95th percentile at 2.4 L/min on a pressure of 9 cm with EPR of 3.  He reports that CPAP is going well, he is up-to-date with his supplies.  He uses a fullface mask, DME is [facility].  His wife has noticed residual snoring.  He is not bothered by it but she is a light sleeper.  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  He is taking appropriate precautions in his practice.   The patient's allergies, current medications, family history, past medical history, past social history, past surgical history and problem list were reviewed and updated as appropriate.    Previously:  I first met him on 11/14/2016 at the request of his primary care physician, at which time he reported a prior diagnosis of obstructive sleep apnea but needed reevaluation as it had been several years and he had an older CPAP machine. He was advised to proceed with sleep study testing. He had a baseline sleep study, followed by a CPAP titration study. I went over his test results  with him in detail today. Baseline sleep study from 01/23/2017 showed a sleep efficiency of 80.1 minutes, sleep latency of 5.5 minutes and REM latency mildly delayed at 147 minutes. He had an increased percentage of slow-wave sleep and a decreased percentage of REM sleep. Total AHI was 12.6 per hour, REM AHI was 78.9 per hour. Average oxygen saturation was 96%, nadir was 83%. He had no significant PLMS. He was advised to proceed with a CPAP titration study. He had this on 03/27/2017. Sleep efficiency was 99.2%, sleep latency 1.5 minutes and REM latency normal at 81 minutes. He had a normal percentage of slow-wave sleep and REM sleep was near normal at 18.4%. He was fitted with a small full face mask and CPAP was titrated from 5 cm to 9 cm. On the final titration pressure his AHI was 0 per hour with brief supine REM sleep achieved an O2 nadir of 91%. Based on his test results I prescribed CPAP therapy for home use with a new machine and new equipment at a pressure of 9 cm.   I reviewed his CPAP compliance data from 09/01/2017 through 09/30/2017 which is a total of 30 days, during which time he used his CPAP 29 days with percent used days greater than 4 hours at 97%, indicating excellent compliance with an average usage of 7 hours and 9 minutes, residual AHI of 0.4 per hour, leak on the low side with the 95th percentile at 5.9 L/m on a pressure  of 9 cm with EPR of 3.   11/14/2016: (He) was previously diagnosed with obstructive sleep apnea and placed on CPAP therapy. His sleep study was over 10 years ago, prior study results are not available for my review today. A CPAP download was not available for my review today as the machine is older. I reviewed your office note from 08/23/2016. His Epworth sleepiness score is 7 out of 24, fatigue score is 10 out of 63. He lives with his wife and son. He has 3 children. He works as a Pharmacist, community. He quit smoking in 1979 and drinks alcohol about 3 times a week and reports  drinking caffeine "a lot": multiple cups of coffee per day. His wife reports that he is not necessarily all that well rested, when he first started on CPAP some 13 years ago he had a great response to it, felt a big difference in his daytime energy. He does report an approximately 30 pound weight gain since his original diagnosis. He has 2 daughters, ages 55 and 68, their 24 year old son lives with them. He had issues with leg cramps for a while. He temporarily stopped his atorvastatin at the time but has been able to restart it. Denies FHx of OSA and has no Sx of RLS. He has no night to night nocturia and denies morning headaches.  The patient's allergies, current medications, family history, past medical history, past social history, past surgical history and problem list were reviewed and updated as appropriate.    His Past Medical History Is Significant For: Past Medical History:  Diagnosis Date  . BCC (basal cell carcinoma of skin)    dr Allyson Sabal  . BPH (benign prostatic hyperplasia)   . Decreased hearing    Left, saw audiologist in 2011 per pt   . DJD (degenerative joint disease)   . Elevated liver function tests   . Hyperlipidemia    mixed  . Hypertension   . Obesity   . Ocular rosacea    on doxy as of 01-2013  . OSA (obstructive sleep apnea)    on CPAP  . Tubular adenoma of colon 2017    His Past Surgical History Is Significant For: Past Surgical History:  Procedure Laterality Date  . COLONOSCOPY W/ POLYPECTOMY  10/20/2015   tubular adenoma  . TONSILLECTOMY      His Family History Is Significant For: Family History  Problem Relation Age of Onset  . Prostate cancer Father 45       age 42s died at 41, also an UNCLE  . Coronary artery disease Paternal Grandmother   . Diabetes Mother   . Hypertension Mother   . Heart failure Maternal Grandmother   . Esophageal cancer Paternal Grandfather   . Colon cancer Neg Hx   . Stroke Neg Hx     His Social History Is Significant  For: Social History   Socioeconomic History  . Marital status: Married    Spouse name: Not on file  . Number of children: 3  . Years of education: Not on file  . Highest education level: Professional school degree (e.g., MD, DDS, DVM, JD)  Occupational History  . Occupation: Pharmacist, community  Social Needs  . Financial resource strain: Not on file  . Food insecurity    Worry: Not on file    Inability: Not on file  . Transportation needs    Medical: Not on file    Non-medical: Not on file  Tobacco Use  . Smoking status: Former Smoker  Quit date: 11/21/1968    Years since quitting: 49.9  . Smokeless tobacco: Never Used  . Tobacco comment: quit late 70s  Substance and Sexual Activity  . Alcohol use: Yes    Comment: socially  . Drug use: No  . Sexual activity: Not on file  Lifestyle  . Physical activity    Days per week: Not on file    Minutes per session: Not on file  . Stress: Not on file  Relationships  . Social Herbalist on phone: Not on file    Gets together: Not on file    Attends religious service: Not on file    Active member of club or organization: Not on file    Attends meetings of clubs or organizations: Not on file    Relationship status: Not on file  Other Topics Concern  . Not on file  Social History Narrative   Lives with his wife in a 2 story home with a basement.  Has 3 children.  Works as a Pharmacist, community.  Education: Associate Professor.     His Allergies Are:  No Known Allergies:   His Current Medications Are:  Outpatient Encounter Medications as of 10/08/2018  Medication Sig  . atorvastatin (LIPITOR) 10 MG tablet Take 1 tablet (10 mg total) by mouth at bedtime.  . Cholecalciferol (VITAMIN D) 1000 UNITS capsule Take 1,000 Units by mouth daily.    . fish oil-omega-3 fatty acids 1000 MG capsule Take 1 g by mouth daily.   Marland Kitchen ibuprofen (ADVIL,MOTRIN) 800 MG tablet Take 800 mg by mouth every 8 (eight) hours as needed.  . tamsulosin (FLOMAX) 0.4 MG CAPS  capsule Take 0.8 mg by mouth daily.   . valACYclovir (VALTREX) 1000 MG tablet Take 1 tablet (1,000 mg total) by mouth 3 (three) times daily. (Patient not taking: Reported on 10/08/2018)   No facility-administered encounter medications on file as of 10/08/2018.   :  Review of Systems:  Out of a complete 14 point review of systems, all are reviewed and negative with the exception of these symptoms as listed below:  Review of Systems  Neurological:       Pt presents today to discuss his cpap. Pt reports that he still snores with his cpap. Otherwise it is going well.    Objective:  Neurological Exam  Physical Exam Physical Examination:   Vitals:   10/08/18 1512  BP: (!) 170/78  Pulse: 68    General Examination: The patient is a very pleasant 68 y.o. male in no acute distress. He appears well-developed and well-nourished and well groomed.   HEENT:Normocephalic, atraumatic, pupils are equal, round and reactive to light, extraocular tracking is good without limitation to gaze excursion or nystagmus noted. Normal smooth pursuit is noted. Hearing is mildly impaired, perhaps, but hard to gauge with both our masks and my shield in place. Face is symmetric with normal facial animation and normal facial sensation. Speech is clear with no dysarthria noted. There is no hypophonia. There is no lip, neck/head, jaw or voice tremor. Neck shows FROM. Oropharynx exam reveals: mildmouth dryness, adequatedental hygiene and mildairway crowding. Tongue protrudes centrally and palate elevates symmetrically. Tonsils are absent.  Chest:Clear to auscultation without wheezing, rhonchi or crackles noted.  Heart:S1+S2+0, regular and normal without murmurs, rubs or gallops noted.   Abdomen:Soft, non-tender and non-distended with normal bowel sounds appreciated on auscultation.  Extremities:There isnopitting edema in the distal lower extremities bilaterally.  Skin: Warm and dry without trophic  changes  noted.Rosacea around nose, stable.  Musculoskeletal: exam reveals no obvious joint deformities, tenderness or joint swelling or erythema.   Neurologically:  Mental status: The patient is awake, alert and oriented in all 4 spheres.Hisimmediate and remote memory, attention, language skills and fund of knowledge are appropriate. There is no evidence of aphasia, agnosia, apraxia or anomia. Speech is clear with normal prosody and enunciation. Thought process is linear. Mood is normaland affect is normal.  Cranial nerves II - XII are as described above under HEENT exam.  Motor exam: Normal bulk, strength and tone is noted. There is tremor. Fine motor skills and coordination: grossly intact.  Cerebellar testing: No dysmetria or intention tremor. There is no truncal or gait ataxia.  Sensory exam: intact to light touchin the upper and lower extremities.  Gait, station and balance:Hestands easily. No veering to one side is noted. No leaning to one side is noted. Posture is age-appropriate and stance is narrow based. Gait showsnormalstride length and normalpace. No problems turning are noted.   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  His wife has noted residual snoring, he is willing to try a pressure increase for this.  To that end, we will increase the pressure to 10 cm at this time.  He is otherwise doing well with his supplies and usually stays up-to-date with his supply changes.  He is using a full facemask fairly successfully without major leak.  I suggested he continue with full compliance and call us if he would like to change the pressure back to 9 cm if  the snoring is unchanged or if the pressure bothers him.  Other than that we will plan to see him back in 1 year.  I answered all his questions today and he was in agreement. I spent 15 minutes in total face-to-face time with the patient, more than 50% of which was spent in counseling and coordination of care, reviewing test results, reviewing medication and discussing or reviewing the diagnosis of OSA, its prognosis and treatment options. Pertinent laboratory and imaging test results that were available during this visit with the patient were reviewed by me and considered in my medical decision making (see chart for details).

## 2018-10-08 NOTE — Patient Instructions (Signed)
As discussed, for your residual snoring we will increase your CPAP pressure to 10 cm.  If you feel that the pressure is too high or if it does not make enough of a difference for your residual snoring we can certainly scale back to 9 cm.  Please call us if you would like to go back to 9 cm.  Otherwise, we will keep it at 10 cm from now on.  Please follow-up routinely in 1 year.

## 2018-10-09 ENCOUNTER — Ambulatory Visit (INDEPENDENT_AMBULATORY_CARE_PROVIDER_SITE_OTHER): Payer: Medicare Other

## 2018-10-09 DIAGNOSIS — Z23 Encounter for immunization: Secondary | ICD-10-CM | POA: Diagnosis not present

## 2018-10-09 MED ORDER — SHINGRIX 50 MCG/0.5ML IM SUSR: 0.5000 mL | Freq: Once | INTRAMUSCULAR | 1 refills | Status: AC

## 2018-10-09 NOTE — Progress Notes (Signed)
Pre visit review using our clinic review tool, if applicable. No additional management support is needed unless otherwise documented below in the visit note.  Patient here for flu vaccine. 0.67mL flu vaccine given in deltoid IM. Patient tolerated well. VIS given.   Patient also wanted tetanus vaccine. Explained to him medicare does not cover tdap here in office he will need to get at the pharmacy. His wife states "can you just give it to him? I will pay for it anyway" Given 0.31mL tdap in right deltoid IM.   Patient also given rx for shingrix vaccine to take downstairs.

## 2018-11-12 DIAGNOSIS — N401 Enlarged prostate with lower urinary tract symptoms: Secondary | ICD-10-CM | POA: Diagnosis not present

## 2018-11-12 LAB — PSA: PSA: 0.76

## 2018-11-25 ENCOUNTER — Encounter: Payer: Self-pay | Admitting: Internal Medicine

## 2018-12-24 DIAGNOSIS — R3915 Urgency of urination: Secondary | ICD-10-CM | POA: Diagnosis not present

## 2018-12-24 DIAGNOSIS — N401 Enlarged prostate with lower urinary tract symptoms: Secondary | ICD-10-CM | POA: Diagnosis not present

## 2019-02-19 DIAGNOSIS — H2513 Age-related nuclear cataract, bilateral: Secondary | ICD-10-CM | POA: Diagnosis not present

## 2019-02-19 DIAGNOSIS — H02886 Meibomian gland dysfunction of left eye, unspecified eyelid: Secondary | ICD-10-CM | POA: Diagnosis not present

## 2019-02-19 DIAGNOSIS — H02836 Dermatochalasis of left eye, unspecified eyelid: Secondary | ICD-10-CM | POA: Diagnosis not present

## 2019-02-19 DIAGNOSIS — H02883 Meibomian gland dysfunction of right eye, unspecified eyelid: Secondary | ICD-10-CM | POA: Diagnosis not present

## 2019-02-19 DIAGNOSIS — H25041 Posterior subcapsular polar age-related cataract, right eye: Secondary | ICD-10-CM | POA: Diagnosis not present

## 2019-02-19 DIAGNOSIS — H02833 Dermatochalasis of right eye, unspecified eyelid: Secondary | ICD-10-CM | POA: Diagnosis not present

## 2019-03-07 ENCOUNTER — Ambulatory Visit: Payer: Medicare Other | Attending: Internal Medicine

## 2019-03-07 DIAGNOSIS — Z23 Encounter for immunization: Secondary | ICD-10-CM | POA: Insufficient documentation

## 2019-03-07 NOTE — Progress Notes (Signed)
   Covid-19 Vaccination Clinic  Name:  Dale [name]    MRN: [id number] DOB: May 08, 1950  03/07/2019  Mr. [name] was observed post Covid-19 immunization for 15 minutes without incidence. He was provided with Vaccine Information Sheet and instruction to access the V-Safe system.   Mr. [name] was instructed to call 911 with any severe reactions post vaccine: Marland Kitchen Difficulty breathing  . Swelling of your face and throat  . A fast heartbeat  . A bad rash all over your body  . Dizziness and weakness    Immunizations Administered    Name Date Dose VIS Date Route   Pfizer COVID-19 Vaccine 03/07/2019 11:50 AM 0.3 mL 01/01/2019 Intramuscular   Manufacturer: [place]   Lot: [id number]   [place]: [id number]

## 2019-03-23 ENCOUNTER — Other Ambulatory Visit: Payer: Self-pay | Admitting: Internal Medicine

## 2019-03-30 ENCOUNTER — Ambulatory Visit: Payer: Medicare Other | Attending: Internal Medicine

## 2019-03-30 DIAGNOSIS — Z23 Encounter for immunization: Secondary | ICD-10-CM | POA: Insufficient documentation

## 2019-03-30 NOTE — Progress Notes (Signed)
   Covid-19 Vaccination Clinic  Name:  Dale [name]    MRN: [id number] DOB: 1950/12/12  03/30/2019  Dale [name] was observed post Covid-19 immunization for 15 minutes without incident. He was provided with Vaccine Information Sheet and instruction to access the V-Safe system.   Dale [name] was instructed to call 911 with any severe reactions post vaccine: Marland Kitchen Difficulty breathing  . Swelling of face and throat  . A fast heartbeat  . A bad rash all over body  . Dizziness and weakness   Immunizations Administered    Name Date Dose VIS Date Route   Pfizer COVID-19 Vaccine 03/30/2019  4:13 PM 0.3 mL 01/01/2019 Intramuscular   Manufacturer: [place]   Lot: [id number]   [place]: [id number]

## 2019-04-30 ENCOUNTER — Ambulatory Visit: Payer: Medicare Other | Admitting: Internal Medicine

## 2019-05-07 ENCOUNTER — Ambulatory Visit: Payer: Medicare Other | Admitting: Internal Medicine

## 2019-05-13 ENCOUNTER — Other Ambulatory Visit: Payer: Self-pay

## 2019-05-14 ENCOUNTER — Ambulatory Visit (INDEPENDENT_AMBULATORY_CARE_PROVIDER_SITE_OTHER): Payer: Medicare Other | Admitting: Internal Medicine

## 2019-05-14 ENCOUNTER — Encounter: Payer: Self-pay | Admitting: Internal Medicine

## 2019-05-14 VITALS — BP 131/81 | HR 55 | Temp 98.0°F | Resp 18 | Ht 70.5 in | Wt 263.2 lb

## 2019-05-14 DIAGNOSIS — M8949 Other hypertrophic osteoarthropathy, multiple sites: Secondary | ICD-10-CM

## 2019-05-14 DIAGNOSIS — E785 Hyperlipidemia, unspecified: Secondary | ICD-10-CM | POA: Diagnosis not present

## 2019-05-14 DIAGNOSIS — E538 Deficiency of other specified B group vitamins: Secondary | ICD-10-CM

## 2019-05-14 DIAGNOSIS — R030 Elevated blood-pressure reading, without diagnosis of hypertension: Secondary | ICD-10-CM | POA: Diagnosis not present

## 2019-05-14 DIAGNOSIS — E539 Vitamin B deficiency, unspecified: Secondary | ICD-10-CM

## 2019-05-14 DIAGNOSIS — M159 Polyosteoarthritis, unspecified: Secondary | ICD-10-CM

## 2019-05-14 LAB — CBC WITH DIFFERENTIAL/PLATELET
Basophils Absolute: 0.1 10*3/uL (ref 0.0–0.1)
Basophils Relative: 0.9 % (ref 0.0–3.0)
Eosinophils Absolute: 0.3 10*3/uL (ref 0.0–0.7)
Eosinophils Relative: 5.5 % — ABNORMAL HIGH (ref 0.0–5.0)
HCT: 44.3 % (ref 39.0–52.0)
Hemoglobin: 14.8 g/dL (ref 13.0–17.0)
Lymphocytes Relative: 26.9 % (ref 12.0–46.0)
Lymphs Abs: 1.6 10*3/uL (ref 0.7–4.0)
MCHC: 33.4 g/dL (ref 30.0–36.0)
MCV: 88.3 fl (ref 78.0–100.0)
Monocytes Absolute: 0.6 10*3/uL (ref 0.1–1.0)
Monocytes Relative: 9.1 % (ref 3.0–12.0)
Neutro Abs: 3.5 10*3/uL (ref 1.4–7.7)
Neutrophils Relative %: 57.6 % (ref 43.0–77.0)
Platelets: 149 10*3/uL — ABNORMAL LOW (ref 150.0–400.0)
RBC: 5.02 Mil/uL (ref 4.22–5.81)
RDW: 14.2 % (ref 11.5–15.5)
WBC: 6.1 10*3/uL (ref 4.0–10.5)

## 2019-05-14 LAB — COMPREHENSIVE METABOLIC PANEL
ALT: 51 U/L (ref 0–53)
AST: 37 U/L (ref 0–37)
Albumin: 4.4 g/dL (ref 3.5–5.2)
Alkaline Phosphatase: 61 U/L (ref 39–117)
BUN: 15 mg/dL (ref 6–23)
CO2: 29 mEq/L (ref 19–32)
Calcium: 9 mg/dL (ref 8.4–10.5)
Chloride: 107 mEq/L (ref 96–112)
Creatinine, Ser: 0.98 mg/dL (ref 0.40–1.50)
GFR: 75.38 mL/min (ref >60.00)
Glucose, Bld: 97 mg/dL (ref 70–99)
Potassium: 4.5 mEq/L (ref 3.5–5.1)
Sodium: 141 mEq/L (ref 135–145)
Total Bilirubin: 0.5 mg/dL (ref 0.2–1.2)
Total Protein: 6.6 g/dL (ref 6.0–8.3)

## 2019-05-14 LAB — B12 AND FOLATE PANEL
Folate: 16 ng/mL (ref >5.9)
Vitamin B-12: 479 pg/mL (ref 211–911)

## 2019-05-14 NOTE — Patient Instructions (Addendum)
Please schedule Medicare Wellness with Glenard Haring.   Continue checking your blood pressure from time to time BP GOAL is between 110/65 and  135/85. If it is consistently higher or lower, let me know    GO TO THE LAB : Get the blood work     [place], [place] back for a checkup in 6 months

## 2019-05-14 NOTE — Progress Notes (Signed)
Subjective:    Patient ID: Dale [name], male    DOB: 1950-05-06, 69 y.o.   MRN: [id number]  DOS:  05/14/2019 Type of visit - description: Follow-up Today we talk about elevated BP, high cholesterol, vitamin deficiencies.   Wt Readings from Last 3 Encounters:  05/14/19 263 lb 4 oz (119.4 kg)  10/08/18 259 lb (117.5 kg)  08/28/18 255 lb 2 oz (115.7 kg)     Review of Systems Denies chest pain no difficulty breathing.  No edema He went hiking few days ago and noticed his left leg was weaker, not a new issue.  Past Medical History:  Diagnosis Date  . BCC (basal cell carcinoma of skin)    dr Allyson Sabal  . BPH (benign prostatic hyperplasia)   . Decreased hearing    Left, saw audiologist in 2011 per pt   . DJD (degenerative joint disease)   . Elevated liver function tests   . Hyperlipidemia    mixed  . Hypertension   . Obesity   . Ocular rosacea    on doxy as of 01-2013  . OSA (obstructive sleep apnea)    on CPAP  . Tubular adenoma of colon 2017    Past Surgical History:  Procedure Laterality Date  . COLONOSCOPY W/ POLYPECTOMY  10/20/2015   tubular adenoma  . TONSILLECTOMY      Allergies as of 05/14/2019   No Known Allergies     Medication List       Accurate as of May 14, 2019 11:59 PM. If you have any questions, ask your nurse or doctor.        STOP taking these medications   valACYclovir 1000 MG tablet Commonly known as: Valtrex Stopped by: Kathlene November, MD   Vitamin D 1000 units capsule Stopped by: Kathlene November, MD     TAKE these medications   atorvastatin 10 MG tablet Commonly known as: LIPITOR Take 1 tablet (10 mg total) by mouth at bedtime.   B-12 PO Take by mouth.   fish oil-omega-3 fatty acids 1000 MG capsule Take 1 g by mouth daily.   ibuprofen 800 MG tablet Commonly known as: ADVIL Take 800 mg by mouth every 8 (eight) hours as needed.   tamsulosin 0.4 MG Caps capsule Commonly known as: FLOMAX Take 0.8 mg by mouth daily.            Objective:   Physical Exam BP 131/81 (BP Location: Left Arm, Patient Position: Sitting, Cuff Size: Normal)   Pulse (!) 55   Temp 98 F (36.7 C) (Temporal)   Resp 18   Ht 5' 10.5" (1.791 m)   Wt 263 lb 4 oz (119.4 kg)   SpO2 98%   BMI 37.24 kg/m  General:   Well developed, NAD, BMI noted. HEENT:  Normocephalic . Face symmetric, atraumatic Lungs:  CTA B Normal respiratory effort, no intercostal retractions, no accessory muscle use. Heart: RRR,  no murmur.  Lower extremities: no pretibial edema bilaterally  Skin: Not pale. Not jaundice Neurologic:  alert & oriented X3.  Speech normal, gait appropriate for age and unassisted Psych--  Cognition and judgment appear intact.  Cooperative with normal attention span and concentration.  Behavior appropriate. No anxious or depressed appearing.      Assessment     Assessment Elevated BP Hyperlipidemia Elevated LFTs DJD Weaker L leg, saw neuro, [facility] 01/2018 chronic L 3 radiculopathy Morbid obesity: BMI 36 + OSA OSA, + CPAP BCC, used to see Dr Allyson Sabal  Ocular rosacea HOH, L  BPH w/LUTS sees urology  PLAN: Elevated BP: Ambulatory BPs in the 130/80s.  On no medications.  Check a CBC Hyperlipidemia: On Lipitor, last FLP very good, check a CMP DJD: Has chronic weaker left leg, felt to be due to chronic L3 radiculopathy.  Observation H/o BCC, still has not seen dermatology, encouraged to do. Thiamine deficiency, suspect B12 deficiency: On B12 supplements, checking levels. RTC 6 months    This visit occurred during the SARS-CoV-2 public health emergency.  Safety protocols were in place, including screening questions prior to the visit, additional usage of staff PPE, and extensive cleaning of exam room while observing appropriate contact time as indicated for disinfecting solutions.

## 2019-05-14 NOTE — Progress Notes (Signed)
Pre visit review using our clinic review tool, if applicable. No additional management support is needed unless otherwise documented below in the visit note. 

## 2019-05-16 NOTE — Assessment & Plan Note (Signed)
Elevated BP: Ambulatory BPs in the 130/80s.  On no medications.  Check a CBC Hyperlipidemia: On Lipitor, last FLP very good, check a CMP DJD: Has chronic weaker left leg, felt to be due to chronic L3 radiculopathy.  Observation H/o BCC, still has not seen dermatology, encouraged to do. Thiamine deficiency, suspect B12 deficiency: On B12 supplements, checking levels. RTC 6 months

## 2019-05-16 NOTE — Assessment & Plan Note (Signed)
Some preventive care issues discussed:   Tdap 09-2018 Shingrix not done, cost. Had Covid shots x2 Sees urology

## 2019-05-21 LAB — VITAMIN B1: Vitamin B1 (Thiamine): 30 nmol/L (ref 8–30)

## 2019-06-04 DIAGNOSIS — D1722 Benign lipomatous neoplasm of skin and subcutaneous tissue of left arm: Secondary | ICD-10-CM | POA: Diagnosis not present

## 2019-06-04 DIAGNOSIS — L821 Other seborrheic keratosis: Secondary | ICD-10-CM | POA: Diagnosis not present

## 2019-06-04 DIAGNOSIS — Z85828 Personal history of other malignant neoplasm of skin: Secondary | ICD-10-CM | POA: Diagnosis not present

## 2019-06-04 DIAGNOSIS — L570 Actinic keratosis: Secondary | ICD-10-CM | POA: Diagnosis not present

## 2019-07-27 DIAGNOSIS — L089 Local infection of the skin and subcutaneous tissue, unspecified: Secondary | ICD-10-CM | POA: Diagnosis not present

## 2019-07-27 DIAGNOSIS — L233 Allergic contact dermatitis due to drugs in contact with skin: Secondary | ICD-10-CM | POA: Diagnosis not present

## 2019-08-05 NOTE — Progress Notes (Signed)
I connected with Dale [name] today by telephone and verified that I am speaking with the correct person using two identifiers. Location patient: home Location provider: work Persons participating in the virtual visit: patient, Marine scientist.    I discussed the limitations, risks, security and privacy concerns of performing an evaluation and management service by telephone and the availability of in person appointments. I also discussed with the patient that there may be a patient responsible charge related to this service. The patient expressed understanding and verbally consented to this telephonic visit.    Interactive audio and video telecommunications were attempted between this provider and patient, however failed, due to patient having technical difficulties OR patient did not have access to video capability.  We continued and completed visit with audio only.  Some vital signs may be absent or patient reported.   Subjective:   Dale [name] is a 69 y.o. male who presents for an Medicare Annual Wellness Visit.  Review of Systems    Cardiac Risk Factors include: advanced age (>70men, >76 women);dyslipidemia;male gender     Objective:     Advanced Directives 08/06/2019 03/20/2018 02/01/2016 02/01/2016  Does Patient Have a Medical Advance Directive? No Yes No No  Does patient want to make changes to medical advance directive? - No - Patient declined - -  Would patient like information on creating a medical advance directive? No - Patient declined - Yes (Inpatient - patient defers creating a medical advance directive at this time) -    Current Medications (verified) Outpatient Encounter Medications as of 08/06/2019  Medication Sig  . atorvastatin (LIPITOR) 10 MG tablet Take 1 tablet (10 mg total) by mouth at bedtime.  . Cyanocobalamin (B-12 PO) Take by mouth.  . fish oil-omega-3 fatty acids 1000 MG capsule Take 1 g by mouth daily.   Marland Kitchen ibuprofen (ADVIL,MOTRIN) 800 MG tablet Take 800 mg by  mouth every 8 (eight) hours as needed.  . tamsulosin (FLOMAX) 0.4 MG CAPS capsule Take 0.8 mg by mouth daily.    No facility-administered encounter medications on file as of 08/06/2019.    Allergies (verified) Patient has no known allergies.   History: Past Medical History:  Diagnosis Date  . BCC (basal cell carcinoma of skin)    dr Allyson Sabal  . BPH (benign prostatic hyperplasia)   . Decreased hearing    Left, saw audiologist in 2011 per pt   . DJD (degenerative joint disease)   . Elevated liver function tests   . Hyperlipidemia    mixed  . Hypertension   . Obesity   . Ocular rosacea    on doxy as of 01-2013  . OSA (obstructive sleep apnea)    on CPAP  . Tubular adenoma of colon 2017   Past Surgical History:  Procedure Laterality Date  . COLONOSCOPY W/ POLYPECTOMY  10/20/2015   tubular adenoma  . TONSILLECTOMY     Family History  Problem Relation Age of Onset  . Prostate cancer Father 40       age 69s died at 69, also an UNCLE  . Coronary artery disease Paternal Grandmother   . Diabetes Mother   . Hypertension Mother   . Heart failure Maternal Grandmother   . Esophageal cancer Paternal Grandfather   . Colon cancer Neg Hx   . Stroke Neg Hx    Social History   Socioeconomic History  . Marital status: Married    Spouse name: Not on file  . Number of children: 3  . Years of  education: Not on file  . Highest education level: Professional school degree (e.g., MD, DDS, DVM, JD)  Occupational History  . Occupation: dentist  Tobacco Use  . Smoking status: Former Smoker    Quit date: 11/21/1968    Years since quitting: 50.7  . Smokeless tobacco: Never Used  . Tobacco comment: quit late 70s  Vaping Use  . Vaping Use: Never used  Substance and Sexual Activity  . Alcohol use: Yes    Comment: socially  . Drug use: No  . Sexual activity: Not on file  Other Topics Concern  . Not on file  Social History Narrative   Lives with his wife in a 2 story home with a  basement.  Has 3 children.  Works as a Pharmacist, community.  Education: Associate Professor.    Social Determinants of Health   Financial Resource Strain: Low Risk   . Difficulty of Paying Living Expenses: Not hard at all  Food Insecurity: No Food Insecurity  . Worried About Charity fundraiser in the Last Year: Never true  . Ran Out of Food in the Last Year: Never true  Transportation Needs: No Transportation Needs  . Lack of Transportation (Medical): No  . Lack of Transportation (Non-Medical): No  Physical Activity:   . Days of Exercise per Week:   . Minutes of Exercise per Session:   Stress:   . Feeling of Stress :   Social Connections:   . Frequency of Communication with Friends and Family:   . Frequency of Social Gatherings with Friends and Family:   . Attends Religious Services:   . Active Member of Clubs or Organizations:   . Attends Archivist Meetings:   Marland Kitchen Marital Status:     Tobacco Counseling Counseling given: Not Answered Comment: quit late 70s   Clinical Intake: Pain : No/denies pain    Activities of Daily Living In your present state of health, do you have any difficulty performing the following activities: 08/06/2019 05/14/2019  Hearing? N N  Vision? N N  Difficulty concentrating or making decisions? N N  Walking or climbing stairs? N N  Dressing or bathing? N N  Doing errands, shopping? N N  Preparing Food and eating ? N -  Using the Toilet? N -  In the past six months, have you accidently leaked urine? N -  Do you have problems with loss of bowel control? N -  Managing your Medications? N -  Managing your Finances? N -  Housekeeping or managing your Housekeeping? N -  Some recent data might be hidden    Patient Care Team: Colon Branch, MD as PCP - [employer], Aaron Edelman, MD as Consulting Physician (Orthopedic Surgery) Ceasar Mons, MD as Consulting Physician (Urology)  Indicate any recent Medical Services you may have received from other  than [facility] providers in the past year (date may be approximate).     Assessment:   This is a routine wellness examination for Dale [name].  Dietary issues and exercise activities discussed: Current Exercise Habits: The patient does not participate in regular exercise at present, Exercise limited by: None identified Diet (meal preparation, eat out, water intake, caffeinated beverages, dairy products, fruits and vegetables): well balanced    Goals    . Maintain helathy active lifestyle.      Depression Screen PHQ 2/9 Scores 08/06/2019 05/14/2019 09/19/2017 08/23/2016 08/18/2015 08/12/2014 08/06/2013  PHQ - 2 Score 0 0 0 0 0 0 0    Fall Risk Fall  Risk  08/06/2019 05/14/2019 12/12/2017 10/02/2017 09/19/2017  Falls in the past year? 0 0 1 Yes Yes  Number falls in past yr: 0 0 1 1 1   Injury with Fall? 0 0 0 Yes Yes  Risk Factor Category  - - - High Fall Risk -  Risk for fall due to : - - Impaired balance/gait;Impaired mobility History of fall(s) Mental status change  Follow up Education provided;Falls prevention discussed Falls evaluation completed Falls evaluation completed;Education provided;Falls prevention discussed Education provided -   Lives with wife in 2 story home.  Any stairs in or around the home? Yes  If so, are there any without handrails? No  Home free of loose throw rugs in walkways, pet beds, electrical cords, etc? Yes  Adequate lighting in your home to reduce risk of falls? Yes    Cognitive Function: Ad8 score reviewed for issues:  Issues making decisions: no  Less interest in hobbies / activities:no  Repeats questions, stories (family complaining):no  Trouble using ordinary gadgets (microwave, computer, phone):no  Forgets the month or year: no  Mismanaging finances: no  Remembering appts:no  Daily problems with thinking and/or memory:no Ad8 score is=0        Immunizations Immunization History  Administered Date(s) Administered  . Fluad Quad(high Dose 65+)  10/09/2018  . Hepatitis A 06/03/2008  . Hepatitis A, Adult 08/12/2014  . Influenza Split 11/23/2010  . Influenza Whole 11/09/2009  . Influenza, High Dose Seasonal PF 12/29/2015, 12/27/2016, 12/05/2017  . Influenza,inj,Quad PF,6+ Mos 10/22/2012, 01/07/2014, 10/07/2014  . PFIZER SARS-COV-2 Vaccination 03/07/2019, 03/30/2019  . Pneumococcal Conjugate-13 08/18/2015  . Pneumococcal Polysaccharide-23 08/12/2014  . Td 06/03/2008  . Tdap 10/09/2018  . Zoster 06/03/2008    TDAP status: Up to date Flu Vaccine status: Up to date Pneumococcal vaccine status: Up to date Covid-19 vaccine status: Completed vaccines  Qualifies for Shingles Vaccine? Yes   Zostavax completed Yes     Screening Tests Health Maintenance  Topic Date Due  . INFLUENZA VACCINE  08/22/2019  . COLONOSCOPY  10/19/2025  . TETANUS/TDAP  10/08/2028  . COVID-19 Vaccine  Completed  . Hepatitis C Screening  Completed  . PNA vac Low Risk Adult  Completed    Health Maintenance  There are no preventive care reminders to display for this patient.  Colorectal cancer screening: Completed 10/20/15. Repeat every 10 years  Lung Cancer Screening: (Low Dose CT Chest recommended if Age 59-80 years, 30 pack-year currently smoking OR have quit w/in 15years.) does not qualify.   Additional Screening:  Vision Screening: Recommended annual ophthalmology exams for early detection of glaucoma and other disorders of the eye. Is the patient up to date with their annual eye exam?  Yes  Who is the provider or what is the name of the office in which the patient attends annual eye exams? Dr.Bull   Dental Screening: Recommended annual dental exams for proper oral hygiene  Community Resource Referral / Chronic Care Management: CRR required this visit?  No   CCM required this visit?  No      Plan:    Please schedule your next medicare wellness visit with me in 1 yr.  Continue to eat heart healthy diet (full of fruits, vegetables,  whole grains, lean protein, water--limit salt, fat, and sugar intake) and increase physical activity as tolerated.  Continue doing brain stimulating activities (puzzles, reading, adult coloring books, staying active) to keep memory sharp.   I have personally reviewed and noted the following in the patient's chart:   .  Medical and social history . Use of alcohol, tobacco or illicit drugs  . Current medications and supplements . Functional ability and status . Nutritional status . Physical activity . Advanced directives . List of other physicians . Hospitalizations, surgeries, and ER visits in previous 12 months . Vitals . Screenings to include cognitive, depression, and falls . Referrals and appointments  In addition, I have reviewed and discussed with patient certain preventive protocols, quality metrics, and best practice recommendations. A written personalized care plan for preventive services as well as general preventive health recommendations were provided to patient.   Due to this being a telephonic visit, the after visit summary with patients personalized plan was offered to patient via mail or my-chart. Patient will access on my-chart.  Shela Nevin, [place]   08/06/2019   Nurse Notes: pt still works full time as a Pharmacist, community.

## 2019-08-06 ENCOUNTER — Other Ambulatory Visit: Payer: Self-pay

## 2019-08-06 ENCOUNTER — Encounter: Payer: Self-pay | Admitting: *Deleted

## 2019-08-06 ENCOUNTER — Ambulatory Visit (INDEPENDENT_AMBULATORY_CARE_PROVIDER_SITE_OTHER): Payer: Medicare Other | Admitting: *Deleted

## 2019-08-06 DIAGNOSIS — Z0000 Encounter for general adult medical examination without abnormal findings: Secondary | ICD-10-CM | POA: Diagnosis not present

## 2019-08-06 NOTE — Patient Instructions (Signed)
Please schedule your next medicare wellness visit with me in 1 yr.  Continue to eat heart healthy diet (full of fruits, vegetables, whole grains, lean protein, water--limit salt, fat, and sugar intake) and increase physical activity as tolerated.  Continue doing brain stimulating activities (puzzles, reading, adult coloring books, staying active) to keep memory sharp.    Dale [name] , Thank you for taking time to come for your Medicare Wellness Visit. I appreciate your ongoing commitment to your health goals. Please review the following plan we discussed and let me know if I can assist you in the future.   These are the goals we discussed: Goals     Maintain helathy active lifestyle.       This is a list of the screening recommended for you and due dates:  Health Maintenance  Topic Date Due   Flu Shot  08/22/2019   Colon Cancer Screening  10/19/2025   Tetanus Vaccine  10/08/2028   COVID-19 Vaccine  Completed    Hepatitis C: One time screening is recommended by Center for Disease Control  (CDC) for  adults born from [birth year] through 1967.   Completed   Pneumonia vaccines  Completed    Preventive Care 71 Years and Older, Male Preventive care refers to lifestyle choices and visits with your health care provider that can promote health and wellness. This includes:  A yearly physical exam. This is also called an annual well check.  Regular dental and eye exams.  Immunizations.  Screening for certain conditions.  Healthy lifestyle choices, such as diet and exercise. What can I expect for my preventive care visit? Physical exam Your health care provider will check:  Height and weight. These may be used to calculate body mass index (BMI), which is a measurement that tells if you are at a healthy weight.  Heart rate and blood pressure.  Your skin for abnormal spots. Counseling Your health care provider may ask you questions about:  Alcohol, tobacco, and drug  use.  Emotional well-being.  Home and relationship well-being.  Sexual activity.  Eating habits.  History of falls.  Memory and ability to understand (cognition).  Work and work Statistician. What immunizations do I need?  Influenza (flu) vaccine  This is recommended every year. Tetanus, diphtheria, and pertussis (Tdap) vaccine  You may need a Td booster every 10 years. Varicella (chickenpox) vaccine  You may need this vaccine if you have not already been vaccinated. Zoster (shingles) vaccine  You may need this after age 71. Pneumococcal conjugate (PCV13) vaccine  One dose is recommended after age 71. Pneumococcal polysaccharide (PPSV23) vaccine  One dose is recommended after age 71. Measles, mumps, and rubella (MMR) vaccine  You may need at least one dose of MMR if you were born in 1957 or later. You may also need a second dose. Meningococcal conjugate (MenACWY) vaccine  You may need this if you have certain conditions. Hepatitis A vaccine  You may need this if you have certain conditions or if you travel or work in places where you may be exposed to hepatitis A. Hepatitis B vaccine  You may need this if you have certain conditions or if you travel or work in places where you may be exposed to hepatitis B. Haemophilus influenzae type b (Hib) vaccine  You may need this if you have certain conditions. You may receive vaccines as individual doses or as more than one vaccine together in one shot (combination vaccines). Talk with your health care provider about  the risks and benefits of combination vaccines. What tests do I need? Blood tests  Lipid and cholesterol levels. These may be checked every 5 years, or more frequently depending on your overall health.  Hepatitis C test.  Hepatitis B test. Screening  Lung cancer screening. You may have this screening every year starting at age 71 if you have a 30-pack-year history of smoking and currently smoke or have  quit within the past 15 years.  Colorectal cancer screening. All adults should have this screening starting at age 45 and continuing until age 71. Your health care provider may recommend screening at age 33 if you are at increased risk. You will have tests every 1-10 years, depending on your results and the type of screening test.  Prostate cancer screening. Recommendations will vary depending on your family history and other risks.  Diabetes screening. This is done by checking your blood sugar (glucose) after you have not eaten for a while (fasting). You may have this done every 1-3 years.  Abdominal aortic aneurysm (AAA) screening. You may need this if you are a current or former smoker.  Sexually transmitted disease (STD) testing. Follow these instructions at home: Eating and drinking  Eat a diet that includes fresh fruits and vegetables, whole grains, lean protein, and low-fat dairy products. Limit your intake of foods with high amounts of sugar, saturated fats, and salt.  Take vitamin and mineral supplements as recommended by your health care provider.  Do not drink alcohol if your health care provider tells you not to drink.  If you drink alcohol: ? Limit how much you have to 0-2 drinks a day. ? Be aware of how much alcohol is in your drink. In the [place] one drink equals one 12 oz bottle of beer (355 mL), one 5 oz glass of wine (148 mL), or one 1 oz glass of hard liquor (44 mL). Lifestyle  Take daily care of your teeth and gums.  Stay active. Exercise for at least 30 minutes on 5 or more days each week.  Do not use any products that contain nicotine or tobacco, such as cigarettes, e-cigarettes, and chewing tobacco. If you need help quitting, ask your health care provider.  If you are sexually active, practice safe sex. Use a condom or other form of protection to prevent STIs (sexually transmitted infections).  Talk with your health care provider about taking a low-dose aspirin  or statin. What's next?  Visit your health care provider once a year for a well check visit.  Ask your health care provider how often you should have your eyes and teeth checked.  Stay up to date on all vaccines. This information is not intended to replace advice given to you by your health care provider. Make sure you discuss any questions you have with your health care provider. Document Revised: 01/01/2018 Document Reviewed: 01/01/2018 Elsevier Patient Education  2020 [employer].

## 2019-08-09 ENCOUNTER — Encounter: Payer: Self-pay | Admitting: Internal Medicine

## 2019-08-09 ENCOUNTER — Ambulatory Visit (INDEPENDENT_AMBULATORY_CARE_PROVIDER_SITE_OTHER): Payer: Medicare Other | Admitting: Internal Medicine

## 2019-08-09 ENCOUNTER — Other Ambulatory Visit: Payer: Self-pay

## 2019-08-09 VITALS — BP 119/75 | HR 62 | Temp 98.1°F | Resp 18 | Ht 71.0 in | Wt 260.1 lb

## 2019-08-09 DIAGNOSIS — S80861A Insect bite (nonvenomous), right lower leg, initial encounter: Secondary | ICD-10-CM | POA: Diagnosis not present

## 2019-08-09 DIAGNOSIS — R600 Localized edema: Secondary | ICD-10-CM | POA: Diagnosis not present

## 2019-08-09 DIAGNOSIS — W57XXXA Bitten or stung by nonvenomous insect and other nonvenomous arthropods, initial encounter: Secondary | ICD-10-CM

## 2019-08-09 DIAGNOSIS — L03115 Cellulitis of right lower limb: Secondary | ICD-10-CM

## 2019-08-09 NOTE — Progress Notes (Signed)
Pre visit review using our clinic review tool, if applicable. No additional management support is needed unless otherwise documented below in the visit note. 

## 2019-08-09 NOTE — Progress Notes (Signed)
Subjective:    Patient ID: Dale [name], male    DOB: February 18, 1950, 69 y.o.   MRN: [id number]  DOS:  08/09/2019   type of visit - description: Acute visit, here with his wife  About 2 weeks ago, had a tick bite at the right calf, the area got red and developed blisters as well as burning. Went to see a dermatologist, he was prescribed doxycycline and topical steroids and diagnosed with infected allergic contact dermatitis due to placing Neosporin there. The patient however recalls he put Neosporin on the area when the rash was already there so he is not sure if he had an allergic contact dermatitis. Nevertheless the area looks much better.  At the time the right calf was 1.5 inches larger in circumference according to the patient and  is getting better.  Unrelated to that, he has noted some lower extremity edema even before the tick bite, it is bilateral, slightly worse on the right?.  Concerned about CHF or kidney failure.   Wt Readings from Last 3 Encounters:  08/09/19 260 lb 2 oz (118 kg)  05/14/19 263 lb 4 oz (119.4 kg)  10/08/18 259 lb (117.5 kg)    Review of Systems Denies fever chills No chest pain no difficulty breathing No orthopnea No nausea, vomiting, diarrhea No unusual headaches or unusual aches.  Past Medical History:  Diagnosis Date  . BCC (basal cell carcinoma of skin)    dr Allyson Sabal  . BPH (benign prostatic hyperplasia)   . Decreased hearing    Left, saw audiologist in 2011 per pt   . DJD (degenerative joint disease)   . Elevated liver function tests   . Hyperlipidemia    mixed  . Hypertension   . Obesity   . Ocular rosacea    on doxy as of 01-2013  . OSA (obstructive sleep apnea)    on CPAP  . Tubular adenoma of colon 2017    Past Surgical History:  Procedure Laterality Date  . COLONOSCOPY W/ POLYPECTOMY  10/20/2015   tubular adenoma  . TONSILLECTOMY      Allergies as of 08/09/2019   No Known Allergies     Medication List        Accurate as of August 09, 2019 11:59 PM. If you have any questions, ask your nurse or doctor.        atorvastatin 10 MG tablet Commonly known as: LIPITOR Take 1 tablet (10 mg total) by mouth at bedtime.   B-12 PO Take by mouth.   fish oil-omega-3 fatty acids 1000 MG capsule Take 1 g by mouth daily.   ibuprofen 800 MG tablet Commonly known as: ADVIL Take 800 mg by mouth every 8 (eight) hours as needed.   tamsulosin 0.4 MG Caps capsule Commonly known as: FLOMAX Take 0.8 mg by mouth daily.          Objective:   Physical Exam BP 119/75 (BP Location: Left Arm, Patient Position: Sitting, Cuff Size: Normal)   Pulse 62   Temp 98.1 F (36.7 C) (Oral)   Resp 18   Ht 5\' 11"  (1.803 m)   Wt 260 lb 2 oz (118 kg)   SpO2 99%   BMI 36.28 kg/m  General:   Well developed, NAD, BMI noted. HEENT:  Normocephalic . Face symmetric, atraumatic Lungs:  CTA B Normal respiratory effort, no intercostal retractions, no accessory muscle use. Heart: RRR,  no murmur.  Lower extremities: He does have mild pretibial edema and sock marks.  Calves  are measured: Right calf is only half inch larger compared to the left. Skin: R calf-area of previous rash is now replaced by few patches or macular hyperpigmentation. Neurologic:  alert & oriented X3.  Speech normal, gait appropriate for age and unassisted Psych--  Cognition and judgment appear intact.  Cooperative with normal attention span and concentration.  Behavior appropriate. No anxious or depressed appearing.      Assessment     Assessment Elevated BP Hyperlipidemia Elevated LFTs DJD Weaker L leg, saw neuro, [facility] 01/2018 chronic L 3 radiculopathy Morbid obesity: BMI 36 + OSA OSA, + CPAP BCC, used to see Dr Allyson Sabal   Ocular rosacea HOH, L  BPH w/LUTS sees urology  PLAN: Tick bite: Had a tick bite 16 days ago, was treated with doxycycline by a dermatologist, currently with no symptoms concerning for Lyme disease. Cellulitis: After  the tick bite the area got red and he had blisters, is much better now.  Recommend to continue applying topical steroids to the area where the scleritis was located. Lower extremity edema, Acute edema, right leg, likely related to cellulitis, improving, will proceed with a [name] r/o DVT if the area  does not continue to improve or if it gets worse. Chronic edema: Also he has noted mild lower extremity edema even before the cellulitis or tick bite, recommend low-salt diet, use of compression stockings. Rarely uses NSAIDs.  He is concerned about CHF or renal failure but on clinical grounds studies very unlikely.  Last BMP satisfactory. RTC already scheduled 10/2019  Time spent 32 minutes, multiple questions answered about lower extremity edema, reassuring him that at this point renal failure or CHF are not playing a role.  This visit occurred during the SARS-CoV-2 public health emergency.  Safety protocols were in place, including screening questions prior to the visit, additional usage of staff PPE, and extensive cleaning of exam room while observing appropriate contact time as indicated for disinfecting solutions.

## 2019-08-09 NOTE — Patient Instructions (Addendum)
Continue applying hydrocortisone cream or a steroid cream to the right leg  If the swelling does not gradually decrease to normal please let me know.  Also call me for swelling increases.  To prevent chronic  swelling, watch your salt intake and use a compression stocking once you are completely back to normal.

## 2019-08-10 NOTE — Assessment & Plan Note (Signed)
Tick bite: Had a tick bite 16 days ago, was treated with doxycycline by a dermatologist, currently with no symptoms concerning for Lyme disease. Cellulitis: After the tick bite the area got red and he had blisters, is much better now.  Recommend to continue applying topical steroids to the area where the scleritis was located. Lower extremity edema, Acute edema, right leg, likely related to cellulitis, improving, will proceed with a [name] r/o DVT if the area  does not continue to improve or if it gets worse. Chronic edema: Also he has noted mild lower extremity edema even before the cellulitis or tick bite, recommend low-salt diet, use of compression stockings. Rarely uses NSAIDs.  He is concerned about CHF or renal failure but on clinical grounds studies very unlikely.  Last BMP satisfactory. RTC already scheduled 10/2019

## 2019-09-29 ENCOUNTER — Other Ambulatory Visit: Payer: Self-pay | Admitting: Internal Medicine

## 2019-10-14 ENCOUNTER — Ambulatory Visit (INDEPENDENT_AMBULATORY_CARE_PROVIDER_SITE_OTHER): Payer: Medicare Other | Admitting: Neurology

## 2019-10-14 ENCOUNTER — Encounter: Payer: Self-pay | Admitting: Neurology

## 2019-10-14 ENCOUNTER — Other Ambulatory Visit: Payer: Self-pay

## 2019-10-14 VITALS — BP 132/78 | HR 63 | Ht 70.0 in | Wt 255.3 lb

## 2019-10-14 DIAGNOSIS — G4733 Obstructive sleep apnea (adult) (pediatric): Secondary | ICD-10-CM

## 2019-10-14 DIAGNOSIS — Z9989 Dependence on other enabling machines and devices: Secondary | ICD-10-CM

## 2019-10-14 NOTE — Progress Notes (Signed)
Subjective:    Patient ID: Dale [name] is a 69 y.o. male.  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  The patient is unaccompanied today and presents for his yearly check up. I last saw him on 10/08/2018, at which time he was fully compliant with his CPAP.  He had some residual snoring and we mutually agreed to increase his pressure from 9 cm to 10 cm at the time.  Today, 10/14/2019: I reviewed his CPAP compliance data from 09/13/2019 through 10/12/2019, which is a total of 30 days, during which time he used his machine every night with percent use days greater than 4 hours at 100%, indicating superb compliance with an average usage of 8 hours and 5 minutes, residual AHI at goal at 0.3/h, leak on the low side with a 95th percentile at 1.5 L/min on a pressure of 10 cm with EPR of 3.  He reports doing well.  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  He is fully vaccinated and has not had any recent illness or medication changes.  He is up-to-date with his supplies, uses a full facemask.  He does report some intermittent residual snoring that is at times bothersome to his wife.  He would not mind increasing the CPAP pressure another notch.  The patient's allergies, current medications, family history, past medical history, past social history, past surgical history and problem list were reviewed and updated as appropriate.    Previously:  10/02/2017, at which time he had started using his new CPAP machine.  He was compliant with treatment.  He was advised to follow-up yearly.     I reviewed his CPAP compliance data from 09/07/2018 through 10/06/2018 which is a total of 30  days, during which time he used his machine every night with percent use days greater than 4 hours at 100%, indicating superb compliance with an average usage of 7 hours and 49 minutes, residual AHI 0.5/h, at goal, leak on the low side with a 95th percentile at 2.4 L/min on a pressure of 9 cm with EPR of 3.     I first met him on 11/14/2016 at the request of his primary care physician, at which time he reported a prior diagnosis of obstructive sleep apnea but needed reevaluation as it had been several years and he had an older CPAP machine. He was advised to proceed with sleep study testing. He had a baseline sleep study, followed by a CPAP titration study. I went over his test results with him in detail today. Baseline sleep study from 01/23/2017 showed a sleep efficiency of 80.1 minutes, sleep latency of 5.5 minutes and REM latency mildly delayed at 147 minutes. He had an increased percentage of slow-wave sleep and a decreased percentage of REM sleep. Total AHI was 12.6 per hour, REM AHI was 78.9 per hour. Average oxygen saturation was 96%, nadir was 83%. He had no significant PLMS. He was advised to proceed with a CPAP titration study. He had this on 03/27/2017. Sleep efficiency was 99.2%, sleep latency 1.5 minutes and REM latency normal at 81 minutes. He had a normal percentage of slow-wave sleep and REM sleep was near normal at 18.4%. He was fitted with a small full face mask and  CPAP was titrated from 5 cm to 9 cm. On the final titration pressure his AHI was 0 per hour with brief supine REM sleep achieved an O2 nadir of 91%. Based on his test results I prescribed CPAP therapy for home use with a new machine and new equipment at a pressure of 9 cm.   I reviewed his CPAP compliance data from 09/01/2017 through 09/30/2017 which is a total of 30 days, during which time he used his CPAP 29 days with percent used days greater than 4 hours at 97%, indicating excellent compliance with an average usage of 7 hours  and 9 minutes, residual AHI of 0.4 per hour, leak on the low side with the 95th percentile at 5.9 L/m on a pressure of 9 cm with EPR of 3.    11/14/2016: (He) was previously diagnosed with obstructive sleep apnea and placed on CPAP therapy. His sleep study was over 10 years ago, prior study results are not available for my review today. A CPAP download was not available for my review today as the machine is older. I reviewed your office note from 08/23/2016. His Epworth sleepiness score is 7 out of 24, fatigue score is 10 out of 63. He lives with his wife and son. He has 3 children. He works as a Pharmacist, community. He quit smoking in 1979 and drinks alcohol about 3 times a week and reports drinking caffeine "a lot": multiple cups of coffee per day. His wife reports that he is not necessarily all that well rested, when he first started on CPAP some 13 years ago he had a great response to it, felt a big difference in his daytime energy. He does report an approximately 30 pound weight gain since his original diagnosis. He has 2 daughters, ages 45 and 16, their 57 year old son lives with them. He had issues with leg cramps for a while. He temporarily stopped his atorvastatin at the time but has been able to restart it. Denies FHx of OSA and has no Sx of RLS. He has no night to night nocturia and denies morning headaches. His Past Medical History Is Significant For: Past Medical History:  Diagnosis Date  . BCC (basal cell carcinoma of skin)    dr Allyson Sabal  . BPH (benign prostatic hyperplasia)   . Decreased hearing    Left, saw audiologist in 2011 per pt   . DJD (degenerative joint disease)   . Elevated liver function tests   . Hyperlipidemia    mixed  . Hypertension   . Obesity   . Ocular rosacea    on doxy as of 01-2013  . OSA (obstructive sleep apnea)    on CPAP  . Tubular adenoma of colon 2017    His Past Surgical History Is Significant For: Past Surgical History:  Procedure Laterality Date  .  COLONOSCOPY W/ POLYPECTOMY  10/20/2015   tubular adenoma  . TONSILLECTOMY      His Family History Is Significant For: Family History  Problem Relation Age of Onset  . Prostate cancer Father 36       age 44s died at 8, also an UNCLE  . Coronary artery disease Paternal Grandmother   . Diabetes Mother   . Hypertension Mother   . Heart failure Maternal Grandmother   . Esophageal cancer Paternal Grandfather   . Colon cancer Neg Hx   . Stroke Neg Hx     His Social History Is Significant For: Social History   Socioeconomic History  . Marital  status: Married    Spouse name: Not on file  . Number of children: 3  . Years of education: Not on file  . Highest education level: Professional school degree (e.g., MD, DDS, DVM, JD)  Occupational History  . Occupation: dentist  Tobacco Use  . Smoking status: Former Smoker    Quit date: 11/21/1968    Years since quitting: 50.9  . Smokeless tobacco: Never Used  . Tobacco comment: quit late 70s  Vaping Use  . Vaping Use: Never used  Substance and Sexual Activity  . Alcohol use: Yes    Comment: socially  . Drug use: No  . Sexual activity: Not on file  Other Topics Concern  . Not on file  Social History Narrative   Lives with his wife in a 2 story home with a basement.  Has 3 children.  Works as a Pharmacist, community.  Education: Associate Professor.    Social Determinants of Health   Financial Resource Strain: Low Risk   . Difficulty of Paying Living Expenses: Not hard at all  Food Insecurity: No Food Insecurity  . Worried About Charity fundraiser in the Last Year: Never true  . Ran Out of Food in the Last Year: Never true  Transportation Needs: No Transportation Needs  . Lack of Transportation (Medical): No  . Lack of Transportation (Non-Medical): No  Physical Activity:   . Days of Exercise per Week: Not on file  . Minutes of Exercise per Session: Not on file  Stress:   . Feeling of Stress : Not on file  Social Connections:   . Frequency of  Communication with Friends and Family: Not on file  . Frequency of Social Gatherings with Friends and Family: Not on file  . Attends Religious Services: Not on file  . Active Member of Clubs or Organizations: Not on file  . Attends Archivist Meetings: Not on file  . Marital Status: Not on file    His Allergies Are:  No Known Allergies:   His Current Medications Are:  Outpatient Encounter Medications as of 10/14/2019  Medication Sig  . atorvastatin (LIPITOR) 10 MG tablet Take 1 tablet (10 mg total) by mouth at bedtime.  . Cyanocobalamin (B-12 PO) Take by mouth.  . fish oil-omega-3 fatty acids 1000 MG capsule Take 1 g by mouth daily.   Marland Kitchen ibuprofen (ADVIL,MOTRIN) 800 MG tablet Take 800 mg by mouth every 8 (eight) hours as needed.  . tamsulosin (FLOMAX) 0.4 MG CAPS capsule Take 0.8 mg by mouth daily.    No facility-administered encounter medications on file as of 10/14/2019.  :  Review of Systems:  Out of a complete 14 point review of systems, all are reviewed and negative with the exception of these symptoms as listed below: Review of Systems  Neurological:       Here for yearly f/u on CPAP. Pt reports he has been doing well. No issues/ concerns to report.     Objective:  Neurological Exam  Physical Exam Physical Examination:   Vitals:   10/14/19 1503  BP: 132/78  Pulse: 63  SpO2: 96%    General Examination: The patient is a very pleasant 69 y.o. male in no acute distress. He appears well-developed and well-nourished and well groomed.   HEENT:Normocephalic, atraumatic, pupils are equal, round and reactive to light, extraocular tracking is good without limitation to gaze excursion or nystagmus noted. Normal smooth pursuit is noted. Hearing is mildly impaired. Face is symmetric with normal facial animation  and normal facial sensation. Speech is clear with no dysarthria noted. There is no hypophonia. There is no lip, neck/head, jaw or voice tremor.  Chest:Clear to  auscultation without wheezing, rhonchi or crackles noted.  Heart:S1+S2+0, regular and normal without murmurs, rubs or gallops noted.   Abdomen:Soft, non-tender and non-distended.  Extremities:There isnopitting edema in the distal lower extremities bilaterally.  Skin: Warm and dry without trophic changes noted.Rosacea around nose, stable.  Musculoskeletal: exam reveals no obvious joint deformities, tenderness or joint swelling or erythema.   Neurologically:  Mental status: The patient is awake, alert and oriented in all 4 spheres.Hisimmediate and remote memory, attention, language skills and fund of knowledge are appropriate. There is no evidence of aphasia, agnosia, apraxia or anomia. Speech is clear with normal prosody and enunciation. Thought process is linear. Mood is normaland affect is normal.  Cranial nerves II - XII are as described above under HEENT exam.  Motor exam: Normal bulk, strength and tone is noted. There is tremor. Fine motor skills and coordination:grosslyintact.  Cerebellar testing: No dysmetria or intention tremor. There is no truncal or gait ataxia.  Sensory exam: intact to light touchin the upper and lower extremities.  Gait, station and balance:Hestands easily. No veering to one side is noted. No leaning to one side is noted. Posture is age-appropriate and stance is narrow based. Gait showsnormalstride length and normalpace. No problems turning are noted.   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 We increased the pressure last year from 9 to 10 cm due to residual snoring.  He still reports intermittent  residual snoring and would not mind increasing the pressure to 11 cm at the time.  He is using a full facemask.  He is very compliant with his CPAP and is highly commended for this.  He is using a full facemask.  His leak is rather low.  If the CPAP pressure of 11 cm is bothersome to him because it is too high or the snoring really does not respond to the increase in pressure and he would like to go back to 10 cm, we can do this remotely.  He is encouraged to call us.  Otherwise, he is advised to continue to be fully compliant with his CPAP and follow-up routinely in 1 year.  I renewed his supply prescription as well.  I answered all his questions today and he was in agreement. I spent 20 minutes in total face-to-face time and in reviewing records during pre-charting, more than 50% of which was spent in counseling and coordination of care, reviewing test results, reviewing medications and treatment regimen and/or in discussing or reviewing the diagnosis of OSA, the prognosis and treatment options. Pertinent laboratory and imaging test results that were available during this visit with the patient were reviewed by me and considered in my medical decision making (see chart for details).

## 2019-10-14 NOTE — Patient Instructions (Addendum)
It was good to see you again.  You are fully compliant with your CPAP.  Your apnea scores are low and leak is very low.  Everything looks good but since you do have some residual snoring, we can certainly try to increase your CPAP pressure from 10cm to 11 cm at this time.  If this does not work out well for you if you feel that the pressure is too high or it does not really help the snoring we can always go back to 10 cm, you can always call us for this.  I have requested new supplies through your DME company and the pressure increase is through your DME company as well.  Please follow-up routinely in 1 year.

## 2019-11-05 ENCOUNTER — Other Ambulatory Visit: Payer: Self-pay

## 2019-11-05 ENCOUNTER — Ambulatory Visit (INDEPENDENT_AMBULATORY_CARE_PROVIDER_SITE_OTHER): Payer: Medicare Other

## 2019-11-05 DIAGNOSIS — Z23 Encounter for immunization: Secondary | ICD-10-CM | POA: Diagnosis not present

## 2019-11-19 ENCOUNTER — Ambulatory Visit (INDEPENDENT_AMBULATORY_CARE_PROVIDER_SITE_OTHER): Payer: Medicare Other | Admitting: Internal Medicine

## 2019-11-19 ENCOUNTER — Encounter: Payer: Self-pay | Admitting: Internal Medicine

## 2019-11-19 ENCOUNTER — Other Ambulatory Visit: Payer: Self-pay

## 2019-11-19 VITALS — BP 136/79 | HR 54 | Temp 98.2°F | Resp 16 | Ht 70.0 in | Wt 264.2 lb

## 2019-11-19 DIAGNOSIS — E785 Hyperlipidemia, unspecified: Secondary | ICD-10-CM | POA: Diagnosis not present

## 2019-11-19 DIAGNOSIS — R030 Elevated blood-pressure reading, without diagnosis of hypertension: Secondary | ICD-10-CM | POA: Diagnosis not present

## 2019-11-19 NOTE — Progress Notes (Signed)
Subjective:    Patient ID: Dale [name], male    DOB: [birth year], 69 y.o.   MRN: [id number]  DOS:  11/19/2019 Type of visit - description: Routine follow-up Feels well. She likes to review his immunizations. Previously complained of lower extremity edema, that is resolved. Good med compliance.   Review of Systems See above   Past Medical History:  Diagnosis Date  . BCC (basal cell carcinoma of skin)    dr Allyson Sabal  . BPH (benign prostatic hyperplasia)   . Decreased hearing    Left, saw audiologist in 2011 per pt   . DJD (degenerative joint disease)   . Elevated liver function tests   . Hyperlipidemia    mixed  . Hypertension   . Obesity   . Ocular rosacea    on doxy as of 01-2013  . OSA (obstructive sleep apnea)    on CPAP  . Tubular adenoma of colon 2017    Past Surgical History:  Procedure Laterality Date  . COLONOSCOPY W/ POLYPECTOMY  10/20/2015   tubular adenoma  . TONSILLECTOMY      Allergies as of 11/19/2019   No Known Allergies     Medication List       Accurate as of November 19, 2019  8:35 AM. If you have any questions, ask your nurse or doctor.        atorvastatin 10 MG tablet Commonly known as: LIPITOR Take 1 tablet (10 mg total) by mouth at bedtime.   B-12 PO Take by mouth.   fish oil-omega-3 fatty acids 1000 MG capsule Take 1 g by mouth daily.   ibuprofen 800 MG tablet Commonly known as: ADVIL Take 800 mg by mouth every 8 (eight) hours as needed.   tamsulosin 0.4 MG Caps capsule Commonly known as: FLOMAX Take 0.8 mg by mouth daily.          Objective:   Physical Exam BP 136/79 (BP Location: Left Arm, Patient Position: Sitting, Cuff Size: Normal)   Pulse (!) 54   Temp 98.2 F (36.8 C) (Oral)   Resp 16   Ht 5\' 10"  (1.778 m)   Wt 264 lb 4 oz (119.9 kg)   SpO2 99%   BMI 37.92 kg/m    General:   Well developed, NAD, BMI noted. HEENT:  Normocephalic . Face symmetric, atraumatic Lungs:  CTA B Normal respiratory  effort, no intercostal retractions, no accessory muscle use. Heart: RRR,  no murmur.  Lower extremities: no pretibial edema bilaterally  Skin: Not pale. Not jaundice Neurologic:  alert & oriented X3.  Speech normal, gait appropriate for age and unassisted Psych--  Cognition and judgment appear intact.  Cooperative with normal attention span and concentration.  Behavior appropriate. No anxious or depressed appearing.   Assessment     Assessment Elevated BP Hyperlipidemia Elevated LFTs DJD Weaker L leg, saw neuro, NCS 01/2018 chronic L 3 radiculopathy Morbid obesity: BMI 36 + OSA OSA, + CPAP BCC, used to see Dr Allyson Sabal   Ocular rosacea HOH, L  BPH w/LUTS sees urology  PLAN: Elevated BP: BP today is very good, ambulatory readings are okay too.  Continue monitoring. Hyperlipidemia: Last LFTs normal, on Lipitor, checking labs Lower extremity edema: See last visit, that is completely resolved. Preventive care reviewed. RTC 6 to 8 months   This visit occurred during the SARS-CoV-2 public health emergency.  Safety protocols were in place, including screening questions prior to the visit, additional usage of staff PPE, and extensive cleaning of  exam room while observing appropriate contact time as indicated for disinfecting solutions.

## 2019-11-19 NOTE — Patient Instructions (Addendum)
Proceed with your Shingrix vaccination at the pharmacy.  Please let us know when you do  Check the  blood pressure monthly BP GOAL is between 110/65 and  135/85. If it is consistently higher or lower, let me know   GO TO THE LAB : Get the blood work     [place], [place] back for a checkup in 6 to 8 months

## 2019-11-19 NOTE — Progress Notes (Signed)
Pre visit review using our clinic review tool, if applicable. No additional management support is needed unless otherwise documented below in the visit note. 

## 2019-11-20 LAB — LIPID PANEL
Cholesterol: 137 mg/dL (ref <200)
HDL: 43 mg/dL (ref >=40)
LDL Cholesterol (Calc): 77 mg/dL (calc)
Non-HDL Cholesterol (Calc): 94 mg/dL (calc) (ref <130)
Total CHOL/HDL Ratio: 3.2 (calc) (ref <5.0)
Triglycerides: 85 mg/dL (ref <150)

## 2019-11-20 NOTE — Assessment & Plan Note (Signed)
Elevated BP: BP today is very good, ambulatory readings are okay too.  Continue monitoring. Hyperlipidemia: Last LFTs normal, on Lipitor, checking labs Lower extremity edema: See last visit, that is completely resolved. Preventive care reviewed. RTC 6 to 8 months

## 2019-11-20 NOTE — Assessment & Plan Note (Signed)
Tdap 09-2018 zostavax ~ 2010 shingrix: Recommended, to proceed at the pharmacy - [facility] 23:2016; [facility] 13: 2017 Had Covid shots x2, plans to get a booster Had a flu shot  CCS: Cscope w/ Dr Fredda Hammed ([place]) ~ 2008, had polyps, redundant colon Cscope 07/2015, wnl per pt, no report  Sees urology

## 2019-12-10 ENCOUNTER — Ambulatory Visit: Payer: Medicare Other | Attending: Internal Medicine

## 2019-12-10 ENCOUNTER — Other Ambulatory Visit (HOSPITAL_BASED_OUTPATIENT_CLINIC_OR_DEPARTMENT_OTHER): Payer: Self-pay | Admitting: Internal Medicine

## 2019-12-10 DIAGNOSIS — Z23 Encounter for immunization: Secondary | ICD-10-CM

## 2019-12-10 NOTE — Progress Notes (Signed)
° °  Covid-19 Vaccination Clinic  Name:  Dale [name]    MRN: [id number] DOB: Apr 01, 1950  12/10/2019  [name] was observed post Covid-19 immunization for 15 minutes without incident. He was provided with Vaccine Information Sheet and instruction to access the V-Safe system.   [name] was instructed to call 911 with any severe reactions post vaccine:  Difficulty breathing   Swelling of face and throat   A fast heartbeat   A bad rash all over body   Dizziness and weakness   Immunizations Administered    Name Date Dose VIS Date Route   Pfizer COVID-19 Vaccine 12/10/2019  9:39 AM 0.3 mL 11/10/2019 Intramuscular   Manufacturer: [place]   Lot: [id number]   [place]: [id number]

## 2019-12-13 MED FILL — PFIZER-BIONTECH COVID-19 VA: 30 | 1 days supply | Qty: 0 | Fill #0

## 2020-01-06 DIAGNOSIS — N401 Enlarged prostate with lower urinary tract symptoms: Secondary | ICD-10-CM | POA: Diagnosis not present

## 2020-01-06 LAB — PSA: PSA: 1.01

## 2020-01-13 DIAGNOSIS — N401 Enlarged prostate with lower urinary tract symptoms: Secondary | ICD-10-CM | POA: Diagnosis not present

## 2020-01-13 DIAGNOSIS — R3912 Poor urinary stream: Secondary | ICD-10-CM | POA: Diagnosis not present

## 2020-01-25 ENCOUNTER — Encounter: Payer: Self-pay | Admitting: Internal Medicine

## 2020-03-29 ENCOUNTER — Other Ambulatory Visit: Payer: Self-pay | Admitting: Internal Medicine

## 2020-04-25 ENCOUNTER — Ambulatory Visit: Payer: Medicare Other | Attending: Internal Medicine

## 2020-04-25 DIAGNOSIS — Z23 Encounter for immunization: Secondary | ICD-10-CM

## 2020-04-25 NOTE — Progress Notes (Signed)
   Covid-19 Vaccination Clinic  Name:  ZEPPELIN COMMISSO, DDS    MRN: [id number] DOB: 1950/09/17  04/25/2020  Mr. Lazaro was observed post Covid-19 immunization for 15 minutes without incident. He was provided with Vaccine Information Sheet and instruction to access the V-Safe system.   Mr. Hupp was instructed to call 911 with any severe reactions post vaccine: Marland Kitchen Difficulty breathing  . Swelling of face and throat  . A fast heartbeat  . A bad rash all over body  . Dizziness and weakness   Immunizations Administered    Name Date Dose VIS Date Route   PFIZER Comrnaty([place] TOP) Covid-19 Vaccine 04/25/2020  2:21 PM 0.3 mL 12/30/2019 Intramuscular   Manufacturer: [place]   Lot: [id number]   [place]: [phone]

## 2020-05-02 ENCOUNTER — Other Ambulatory Visit (HOSPITAL_BASED_OUTPATIENT_CLINIC_OR_DEPARTMENT_OTHER): Payer: Self-pay

## 2020-05-02 MED ORDER — PFIZER-BIONT COVID-19 VAC-TRIS 30 MCG/0.3ML IM SUSP
INTRAMUSCULAR | 0 refills | Status: DC
  Filled 2020-05-02: qty 0.3, 1d supply, fill #0

## 2020-05-11 NOTE — Progress Notes (Signed)
Referring-Dale Larose Kells, MD Reason for referral-hypertension  HPI: 70 year old male for evaluation of hypertension at request of Belinda Fisher, MD.  Patient seen previously but not since November 2014.  Patient has dyspnea with more vigorous activities.  No orthopnea, PND, pedal edema, chest pain or syncope.  Cardiology now asked to evaluate.  Current Outpatient Medications  Medication Sig Dispense Refill  . atorvastatin (LIPITOR) 10 MG tablet Take 1 tablet (10 mg total) by mouth at bedtime. 90 tablet 1  . Cholecalciferol (VITAMIN D-3) 25 MCG (1000 UT) CAPS Take by mouth.    Marland Kitchen COVID-19 mRNA Vac-TriS, Pfizer, (PFIZER-BIONT COVID-19 VAC-TRIS) SUSP injection Inject into the muscle. 0.3 mL 0  . Cyanocobalamin (B-12 PO) Take by mouth.    . fish oil-omega-3 fatty acids 1000 MG capsule Take 1 g by mouth daily.    Marland Kitchen ibuprofen (ADVIL,MOTRIN) 800 MG tablet Take 800 mg by mouth every 8 (eight) hours as needed.    . tamsulosin (FLOMAX) 0.4 MG CAPS capsule Take 0.8 mg by mouth daily.      No current facility-administered medications for this visit.    No Known Allergies   Past Medical History:  Diagnosis Date  . BCC (basal cell carcinoma of skin)    dr Allyson Sabal  . BPH (benign prostatic hyperplasia)   . Decreased hearing    Left, saw audiologist in 2011 per pt   . DJD (degenerative joint disease)   . Elevated liver function tests   . Hyperlipidemia    mixed  . Hypertension   . Obesity   . Ocular rosacea    on doxy as of 01-2013  . OSA (obstructive sleep apnea)    on CPAP  . Tubular adenoma of colon 2017    Past Surgical History:  Procedure Laterality Date  . COLONOSCOPY W/ POLYPECTOMY  10/20/2015   tubular adenoma  . TONSILLECTOMY      Social History   Socioeconomic History  . Marital status: Married    Spouse name: Not on file  . Number of children: 3  . Years of education: Not on file  . Highest education level: Professional school degree (e.g., MD, DDS, DVM, JD)  Occupational  History  . Occupation: dentist  Tobacco Use  . Smoking status: Former Smoker    Quit date: 11/21/1968    Years since quitting: 51.5  . Smokeless tobacco: Never Used  . Tobacco comment: quit late 70s  Vaping Use  . Vaping Use: Never used  Substance and Sexual Activity  . Alcohol use: Yes    Comment: 2 cocktails per week  . Drug use: No  . Sexual activity: Not on file  Other Topics Concern  . Not on file  Social History Narrative   Lives with his wife in a 2 story home with a basement.  Has 3 children.  Works as a Pharmacist, community.  Education: Associate Professor.    Social Determinants of Health   Financial Resource Strain: Low Risk   . Difficulty of Paying Living Expenses: Not hard at all  Food Insecurity: No Food Insecurity  . Worried About Charity fundraiser in the Last Year: Never true  . Ran Out of Food in the Last Year: Never true  Transportation Needs: No Transportation Needs  . Lack of Transportation (Medical): No  . Lack of Transportation (Non-Medical): No  Physical Activity: Not on file  Stress: Not on file  Social Connections: Not on file  Intimate Partner Violence: Not on file  Family History  Problem Relation Age of Onset  . Prostate cancer Father 60       age 60s died at 59, also an UNCLE  . Coronary artery disease Paternal Grandmother   . Diabetes Mother   . Hypertension Mother   . Heart failure Maternal Grandmother   . Esophageal cancer Paternal Grandfather   . Colon cancer Neg Hx   . Stroke Neg Hx     ROS: Back pain but no fevers or chills, productive cough, hemoptysis, dysphasia, odynophagia, melena, hematochezia, dysuria, hematuria, rash, seizure activity, orthopnea, PND, pedal edema, claudication. Remaining systems are negative.  Physical Exam:   Blood pressure 128/74, pulse (!) 55, height 5' 10.5" (1.791 m), weight 263 lb (119.3 kg), SpO2 97 %.  General:  Well developed/obese in NAD Skin warm/dry Patient not depressed No peripheral  clubbing Back-normal HEENT-normal/normal eyelids Neck supple/normal carotid upstroke bilaterally; no bruits; no JVD; no thyromegaly chest - CTA/ normal expansion CV - RRR/normal S1 and S2; no murmurs, rubs or gallops;  PMI nondisplaced Abdomen -NT/ND, no HSM, no mass, + bowel sounds, positive bruit 2+ femoral pulses, no bruits Ext-no edema, chords, 2+ DP Neuro-grossly nonfocal  ECG -sinus bradycardia at a rate of 55, no ST changes.  Personally reviewed  A/P  1 hypertension-blood pressure is controlled on no medications.  We will follow.  2 hyperlipidemia-continue statin.  We will arrange a calcium score for risk stratification.  If elevated patient may require functional study and higher dose statin.  3 bruit-schedule abdominal ultrasound to exclude aneurysm.  Kirk Ruths, MD

## 2020-05-19 ENCOUNTER — Other Ambulatory Visit: Payer: Self-pay

## 2020-05-19 ENCOUNTER — Encounter: Payer: Self-pay | Admitting: Cardiology

## 2020-05-19 ENCOUNTER — Ambulatory Visit (INDEPENDENT_AMBULATORY_CARE_PROVIDER_SITE_OTHER): Payer: Medicare Other | Admitting: Cardiology

## 2020-05-19 VITALS — BP 128/74 | HR 55 | Ht 70.5 in | Wt 263.0 lb

## 2020-05-19 DIAGNOSIS — Z136 Encounter for screening for cardiovascular disorders: Secondary | ICD-10-CM | POA: Diagnosis not present

## 2020-05-19 DIAGNOSIS — Z87891 Personal history of nicotine dependence: Secondary | ICD-10-CM

## 2020-05-19 DIAGNOSIS — E785 Hyperlipidemia, unspecified: Secondary | ICD-10-CM | POA: Diagnosis not present

## 2020-05-19 DIAGNOSIS — R7989 Other specified abnormal findings of blood chemistry: Secondary | ICD-10-CM

## 2020-05-19 DIAGNOSIS — R0989 Other specified symptoms and signs involving the circulatory and respiratory systems: Secondary | ICD-10-CM | POA: Diagnosis not present

## 2020-05-19 DIAGNOSIS — R072 Precordial pain: Secondary | ICD-10-CM | POA: Diagnosis not present

## 2020-05-19 NOTE — Patient Instructions (Signed)
  Testing/Procedures:  Your physician has requested that you have an abdominal aorta duplex. During this test, an ultrasound is used to evaluate the aorta. Allow 30 minutes for this exam. Do not eat after midnight the day before and avoid carbonated beverages [facility]  CT CORONARY CALCIUM SCORING AT [place]   Follow-Up: At [facility], you and your health needs are our priority.  As part of our continuing mission to provide you with exceptional heart care, we have created designated Provider Care Teams.  These Care Teams include your primary Cardiologist (physician) and Advanced Practice Providers (APPs -  Physician Assistants and Nurse Practitioners) who all work together to provide you with the care you need, when you need it.  We recommend signing up for the patient portal called "MyChart".  Sign up information is provided on this After Visit Summary.  MyChart is used to connect with patients for Virtual Visits (Telemedicine).  Patients are able to view lab/test results, encounter notes, upcoming appointments, etc.  Non-urgent messages can be sent to your provider as well.   To learn more about what you can do with MyChart, go to [email or web address].    Your next appointment:   12 month(s)  The format for your next appointment:   In Person  Provider:   Kirk Ruths, MD

## 2020-05-26 ENCOUNTER — Ambulatory Visit (HOSPITAL_COMMUNITY)
Admission: RE | Admit: 2020-05-26 | Discharge: 2020-05-26 | Disposition: A | Discharge status: Home / Self Care | Payer: Medicare Other | Source: Ambulatory Visit | Attending: Cardiology | Admitting: Cardiology

## 2020-05-26 ENCOUNTER — Other Ambulatory Visit: Payer: Self-pay

## 2020-05-26 DIAGNOSIS — E785 Hyperlipidemia, unspecified: Secondary | ICD-10-CM | POA: Diagnosis not present

## 2020-05-26 DIAGNOSIS — Z87891 Personal history of nicotine dependence: Secondary | ICD-10-CM

## 2020-05-26 DIAGNOSIS — I10 Essential (primary) hypertension: Secondary | ICD-10-CM | POA: Insufficient documentation

## 2020-05-26 DIAGNOSIS — Z136 Encounter for screening for cardiovascular disorders: Secondary | ICD-10-CM | POA: Diagnosis not present

## 2020-05-26 DIAGNOSIS — R0989 Other specified symptoms and signs involving the circulatory and respiratory systems: Secondary | ICD-10-CM | POA: Insufficient documentation

## 2020-06-02 ENCOUNTER — Encounter: Payer: Self-pay | Admitting: *Deleted

## 2020-06-16 ENCOUNTER — Ambulatory Visit (INDEPENDENT_AMBULATORY_CARE_PROVIDER_SITE_OTHER): Payer: Medicare Other | Admitting: Internal Medicine

## 2020-06-16 ENCOUNTER — Other Ambulatory Visit (HOSPITAL_BASED_OUTPATIENT_CLINIC_OR_DEPARTMENT_OTHER): Payer: Self-pay

## 2020-06-16 ENCOUNTER — Other Ambulatory Visit: Payer: Self-pay

## 2020-06-16 VITALS — BP 134/81 | HR 55 | Temp 96.0°F | Ht 70.5 in | Wt 263.4 lb

## 2020-06-16 DIAGNOSIS — W19XXXD Unspecified fall, subsequent encounter: Secondary | ICD-10-CM

## 2020-06-16 DIAGNOSIS — E785 Hyperlipidemia, unspecified: Secondary | ICD-10-CM | POA: Diagnosis not present

## 2020-06-16 DIAGNOSIS — R030 Elevated blood-pressure reading, without diagnosis of hypertension: Secondary | ICD-10-CM

## 2020-06-16 DIAGNOSIS — R600 Localized edema: Secondary | ICD-10-CM | POA: Diagnosis not present

## 2020-06-16 LAB — COMPREHENSIVE METABOLIC PANEL
ALT: 59 U/L — ABNORMAL HIGH (ref 0–53)
AST: 43 U/L — ABNORMAL HIGH (ref 0–37)
Albumin: 4.5 g/dL (ref 3.5–5.2)
Alkaline Phosphatase: 58 U/L (ref 39–117)
BUN: 15 mg/dL (ref 6–23)
CO2: 29 mEq/L (ref 19–32)
Calcium: 9.5 mg/dL (ref 8.4–10.5)
Chloride: 107 mEq/L (ref 96–112)
Creatinine, Ser: 0.96 mg/dL (ref 0.40–1.50)
GFR: 79.16 mL/min (ref >60.00)
Glucose, Bld: 96 mg/dL (ref 70–99)
Potassium: 4.8 mEq/L (ref 3.5–5.1)
Sodium: 142 mEq/L (ref 135–145)
Total Bilirubin: 0.7 mg/dL (ref 0.2–1.2)
Total Protein: 6.7 g/dL (ref 6.0–8.3)

## 2020-06-16 LAB — CBC WITH DIFFERENTIAL/PLATELET
Basophils Absolute: 0.1 10*3/uL (ref 0.0–0.1)
Basophils Relative: 1.2 % (ref 0.0–3.0)
Eosinophils Absolute: 0.4 10*3/uL (ref 0.0–0.7)
Eosinophils Relative: 5.8 % — ABNORMAL HIGH (ref 0.0–5.0)
HCT: 43.6 % (ref 39.0–52.0)
Hemoglobin: 14.8 g/dL (ref 13.0–17.0)
Lymphocytes Relative: 28.2 % (ref 12.0–46.0)
Lymphs Abs: 1.9 10*3/uL (ref 0.7–4.0)
MCHC: 33.9 g/dL (ref 30.0–36.0)
MCV: 87.7 fl (ref 78.0–100.0)
Monocytes Absolute: 0.5 10*3/uL (ref 0.1–1.0)
Monocytes Relative: 8.1 % (ref 3.0–12.0)
Neutro Abs: 3.7 10*3/uL (ref 1.4–7.7)
Neutrophils Relative %: 56.7 % (ref 43.0–77.0)
Platelets: 149 10*3/uL — ABNORMAL LOW (ref 150.0–400.0)
RBC: 4.97 Mil/uL (ref 4.22–5.81)
RDW: 13.9 % (ref 11.5–15.5)
WBC: 6.6 10*3/uL (ref 4.0–10.5)

## 2020-06-16 MED ORDER — ZOSTER VAC RECOMB ADJUVANTED 50 MCG/0.5ML IM SUSR
INTRAMUSCULAR | 0 refills | Status: DC
  Filled 2020-06-16: qty 1, 1d supply, fill #0

## 2020-06-16 NOTE — Patient Instructions (Signed)
Check the  blood pressure  BP GOAL is between 110/65 and  135/85. If it is consistently higher or lower, let me know  For leg cramps you may like to try tonic water at night  Proceed w/ the [facility] vaccine    GO TO THE LAB : Get the blood work     Zalma, [place] back for a check up in 6 months     Fall Prevention in the Home, Adult Falls can cause injuries and can affect people from all age groups. There are many simple things that you can do to make your home safe and to help prevent falls. Ask for help when making these changes, if needed. What actions can I take to prevent falls? General instructions  Use good lighting in all rooms. Replace any light bulbs that burn out.  Turn on lights if it is dark. Use night-lights.  Place frequently used items in easy-to-reach places. Lower the shelves around your home if necessary.  Set up furniture so that there are clear paths around it. Avoid moving your furniture around.  Remove throw rugs and other tripping hazards from the floor.  Avoid walking on wet floors.  Fix any uneven floor surfaces.  Add color or contrast paint or tape to grab bars and handrails in your home. Place contrasting color strips on the first and last steps of stairways.  When you use a stepladder, make sure that it is completely opened and that the sides are firmly locked. Have someone hold the ladder while you are using it. Do not climb a closed stepladder.  Be aware of any and all pets. What can I do in the bathroom?  Keep the floor dry. Immediately clean up any water that spills onto the floor.  Remove soap buildup in the tub or shower on a regular basis.  Use non-skid mats or decals on the floor of the tub or shower.  Attach bath mats securely with double-sided, non-slip rug tape.  If you need to sit down while you are in the shower, use a plastic, non-slip stool.  Install grab bars by the toilet  and in the tub and shower. Do not use towel bars as grab bars.      What can I do in the bedroom?  Make sure that a bedside light is easy to reach.  Do not use oversized bedding that drapes onto the floor.  Have a firm chair that has side arms to use for getting dressed. What can I do in the kitchen?  Clean up any spills right away.  If you need to reach for something above you, use a sturdy step stool that has a grab bar.  Keep electrical cables out of the way.  Do not use floor polish or wax that makes floors slippery. If you must use wax, make sure that it is non-skid floor wax. What can I do in the stairways?  Do not leave any items on the stairs.  Make sure that you have a light switch at the top of the stairs and the bottom of the stairs. Have them installed if you do not have them.  Make sure that there are handrails on both sides of the stairs. Fix handrails that are broken or loose. Make sure that handrails are as long as the stairways.  Install non-slip stair treads on all stairs in your home.  Avoid having throw rugs at the top or bottom  of stairways, or secure the rugs with carpet tape to prevent them from moving.  Choose a carpet design that does not hide the edge of steps on the stairway.  Check any carpeting to make sure that it is firmly attached to the stairs. Fix any carpet that is loose or worn. What can I do on the outside of my home?  Use bright outdoor lighting.  Regularly repair the edges of walkways and driveways and fix any cracks.  Remove high doorway thresholds.  Trim any shrubbery on the main path into your home.  Regularly check that handrails are securely fastened and in good repair. Both sides of any steps should have handrails.  Install guardrails along the edges of any raised decks or porches.  Clear walkways of debris and clutter, including tools and rocks.  Have leaves, snow, and ice cleared regularly.  Use sand or salt on  walkways during winter months.  In the garage, clean up any spills right away, including grease or oil spills. What other actions can I take?  Wear closed-toe shoes that fit well and support your feet. Wear shoes that have rubber soles or low heels.  Use mobility aids as needed, such as canes, walkers, scooters, and crutches.  Review your medicines with your health care provider. Some medicines can cause dizziness or changes in blood pressure, which increase your risk of falling. Talk with your health care provider about other ways that you can decrease your risk of falls. This may include working with a physical therapist or trainer to improve your strength, balance, and endurance. Where to find more information  Centers for Disease Control and Prevention, [employer]: WebmailGuide.co.za  [employer] on Aging: BrainJudge.co.uk Contact a health care provider if:  You are afraid of falling at home.  You feel weak, drowsy, or dizzy at home.  You fall at home. Summary  There are many simple things that you can do to make your home safe and to help prevent falls.  Ways to make your home safe include removing tripping hazards and installing grab bars in the bathroom.  Ask for help when making these changes in your home. This information is not intended to replace advice given to you by your health care provider. Make sure you discuss any questions you have with your health care provider. Document Revised: 12/20/2016 Document Reviewed: 08/22/2016 Elsevier Patient Education  2021 [employer].

## 2020-06-16 NOTE — Assessment & Plan Note (Signed)
Elevated BP, h/o: BP today is okay, checking labs, recommend to monitor. Hyperlipidemia: Last FLP satisfactory. Edema: Saw cardiology, no major concerns, edema resolved. Falls x2: Mechanical falls, counseled about prevention. Leg cramps: Also complaining of leg cramps, recommend a trial with tonic water. OSA: Good CPAP compliance Preventive care: Reports had COVID shot x4, okay to proceed with Shingrix at his local pharmacy. RTC 6 months

## 2020-06-16 NOTE — Progress Notes (Signed)
Subjective:    Patient ID: Dale [name], DDS, male    DOB: 1950-04-03, 70 y.o.   MRN: [id number]  DOS:  06/16/2020 Type of visit - description: ROV Here with his wife  Since last visit had 2 mechanical falls with no major injuries. Not associated dizziness, chest pain, palpitations. "I was not careful"  Also c/o stress mostly related with the news in TV  He is [place], wife is concerned, patient is not  Review of Systems See above   Past Medical History:  Diagnosis Date  . BCC (basal cell carcinoma of skin)    dr Allyson Sabal  . BPH (benign prostatic hyperplasia)   . Decreased hearing    Left, saw audiologist in 2011 per pt   . DJD (degenerative joint disease)   . Elevated liver function tests   . Hyperlipidemia    mixed  . Hypertension   . Obesity   . Ocular rosacea    on doxy as of 01-2013  . OSA (obstructive sleep apnea)    on CPAP  . Tubular adenoma of colon 2017    Past Surgical History:  Procedure Laterality Date  . COLONOSCOPY W/ POLYPECTOMY  10/20/2015   tubular adenoma  . TONSILLECTOMY      Allergies as of 06/16/2020   No Known Allergies     Medication List       Accurate as of Jun 16, 2020  8:28 AM. If you have any questions, ask your nurse or doctor.        atorvastatin 10 MG tablet Commonly known as: LIPITOR Take 1 tablet (10 mg total) by mouth at bedtime.   B-12 PO Take by mouth.   fish oil-omega-3 fatty acids 1000 MG capsule Take 1 g by mouth daily.   ibuprofen 800 MG tablet Commonly known as: ADVIL Take 800 mg by mouth every 8 (eight) hours as needed.   Pfizer-BioNT COVID-19 Vac-TriS Susp injection Generic drug: COVID-19 mRNA Vac-TriS (Pfizer) Inject into the muscle.   tamsulosin 0.4 MG Caps capsule Commonly known as: FLOMAX Take 0.8 mg by mouth daily.   Vitamin D-3 25 MCG (1000 UT) Caps Take by mouth.          Objective:   Physical Exam BP 134/81 (BP Location: Right Arm, Patient Position: Sitting, Cuff Size: Large)    Pulse (!) 55   Temp (!) 96 F (35.6 C) (Temporal)   Ht 5' 10.5" (1.791 m)   Wt 263 lb 6.4 oz (119.5 kg)   SpO2 100%   BMI 37.26 kg/m   General:   Well developed, NAD, BMI noted. HEENT:  Normocephalic . Face symmetric, atraumatic.  No obvious HOH Lungs:  CTA B Normal respiratory effort, no intercostal retractions, no accessory muscle use. Heart: RRR,  no murmur.  Lower extremities: no pretibial edema bilaterally  Skin: Not pale. Not jaundice Neurologic:  alert & oriented X3.  Speech normal, gait appropriate for age and unassisted Psych--  Cognition and judgment appear intact.  Cooperative with normal attention span and concentration.  Behavior appropriate. No anxious or depressed appearing.      Assessment    Assessment Elevated BP Hyperlipidemia Elevated LFTs DJD Weaker L leg, saw neuro, NCS 01/2018 chronic L 3 radiculopathy Morbid obesity: BMI 36 + OSA OSA, + CPAP BCC, used to see Dr Franchot Erichsen rosacea HOH, L  BPH w/LUTS sees urology Aortic ultrasound: 05/2020, no AAA  PLAN: Elevated BP, h/o: BP today is okay, checking labs, recommend to monitor.  Hyperlipidemia: Last FLP satisfactory. Edema: Saw cardiology, no major concerns, edema resolved. Falls x2: Mechanical falls, counseled about prevention. Leg cramps: Also complaining of leg cramps, recommend a trial with tonic water. OSA: Good CPAP compliance Preventive care: Reports had COVID shot x4, okay to proceed with Shingrix at his local pharmacy. RTC 6 months  This visit occurred during the SARS-CoV-2 public health emergency.  Safety protocols were in place, including screening questions prior to the visit, additional usage of staff PPE, and extensive cleaning of exam room while observing appropriate contact time as indicated for disinfecting solutions.

## 2020-06-22 DIAGNOSIS — L578 Other skin changes due to chronic exposure to nonionizing radiation: Secondary | ICD-10-CM | POA: Diagnosis not present

## 2020-06-22 DIAGNOSIS — L814 Other melanin hyperpigmentation: Secondary | ICD-10-CM | POA: Diagnosis not present

## 2020-06-22 DIAGNOSIS — Z85828 Personal history of other malignant neoplasm of skin: Secondary | ICD-10-CM | POA: Diagnosis not present

## 2020-06-22 DIAGNOSIS — D489 Neoplasm of uncertain behavior, unspecified: Secondary | ICD-10-CM | POA: Diagnosis not present

## 2020-06-22 DIAGNOSIS — L570 Actinic keratosis: Secondary | ICD-10-CM | POA: Diagnosis not present

## 2020-06-22 DIAGNOSIS — L820 Inflamed seborrheic keratosis: Secondary | ICD-10-CM | POA: Diagnosis not present

## 2020-06-22 DIAGNOSIS — L810 Postinflammatory hyperpigmentation: Secondary | ICD-10-CM | POA: Diagnosis not present

## 2020-06-22 DIAGNOSIS — D1801 Hemangioma of skin and subcutaneous tissue: Secondary | ICD-10-CM | POA: Diagnosis not present

## 2020-06-22 DIAGNOSIS — L818 Other specified disorders of pigmentation: Secondary | ICD-10-CM | POA: Diagnosis not present

## 2020-06-22 DIAGNOSIS — Z1283 Encounter for screening for malignant neoplasm of skin: Secondary | ICD-10-CM | POA: Diagnosis not present

## 2020-06-22 DIAGNOSIS — Z87891 Personal history of nicotine dependence: Secondary | ICD-10-CM | POA: Diagnosis not present

## 2020-06-23 ENCOUNTER — Other Ambulatory Visit: Payer: Medicare Other

## 2020-06-30 ENCOUNTER — Ambulatory Visit (INDEPENDENT_AMBULATORY_CARE_PROVIDER_SITE_OTHER)
Admission: RE | Admit: 2020-06-30 | Discharge: 2020-06-30 | Disposition: A | Discharge status: Home / Self Care | Payer: Self-pay | Source: Ambulatory Visit | Attending: Cardiology | Admitting: Cardiology

## 2020-06-30 ENCOUNTER — Other Ambulatory Visit: Payer: Self-pay

## 2020-06-30 DIAGNOSIS — R072 Precordial pain: Secondary | ICD-10-CM

## 2020-06-30 DIAGNOSIS — E785 Hyperlipidemia, unspecified: Secondary | ICD-10-CM

## 2020-06-30 DIAGNOSIS — Z136 Encounter for screening for cardiovascular disorders: Secondary | ICD-10-CM

## 2020-07-10 ENCOUNTER — Encounter: Payer: Self-pay | Admitting: *Deleted

## 2020-09-01 ENCOUNTER — Other Ambulatory Visit (HOSPITAL_BASED_OUTPATIENT_CLINIC_OR_DEPARTMENT_OTHER): Payer: Self-pay

## 2020-09-01 MED ORDER — ZOSTER VAC RECOMB ADJUVANTED 50 MCG/0.5ML IM SUSR
INTRAMUSCULAR | 0 refills | Status: DC
  Filled 2020-09-01: qty 0.5, 1d supply, fill #0

## 2020-09-05 ENCOUNTER — Telehealth: Payer: Self-pay | Admitting: Neurology

## 2020-09-05 NOTE — Telephone Encounter (Signed)
OV cancelled due to MD out, LVM/mychart msg sent.

## 2020-10-02 ENCOUNTER — Other Ambulatory Visit: Payer: Self-pay | Admitting: Internal Medicine

## 2020-10-19 ENCOUNTER — Ambulatory Visit: Payer: Medicare Other | Admitting: Neurology

## 2020-10-27 ENCOUNTER — Ambulatory Visit (INDEPENDENT_AMBULATORY_CARE_PROVIDER_SITE_OTHER): Payer: Medicare Other

## 2020-10-27 ENCOUNTER — Other Ambulatory Visit: Payer: Self-pay

## 2020-10-27 DIAGNOSIS — Z23 Encounter for immunization: Secondary | ICD-10-CM

## 2020-11-24 ENCOUNTER — Ambulatory Visit: Payer: Medicare Other | Attending: Internal Medicine

## 2020-11-24 DIAGNOSIS — Z23 Encounter for immunization: Secondary | ICD-10-CM

## 2020-11-24 NOTE — Progress Notes (Signed)
   Covid-19 Vaccination Clinic  Name:  Dale [name], Dale [name]    MRN: [id number] DOB: 1950/02/25  11/24/2020  Dale [name] was observed post Covid-19 immunization for 15 minutes without incident. He was provided with Vaccine Information Sheet and instruction to access the V-Safe system.   Dale [name] was instructed to call 911 with any severe reactions post vaccine: Difficulty breathing  Swelling of face and throat  A fast heartbeat  A bad rash all over body  Dizziness and weakness   Immunizations Administered     Name Date Dose VIS Date Route   Pfizer Covid-19 Vaccine Bivalent Booster 11/24/2020  9:11 AM 0.3 mL 09/20/2020 Intramuscular   Manufacturer: [place]   Lot: [id number]   [place]: [phone]

## 2020-12-12 ENCOUNTER — Other Ambulatory Visit (HOSPITAL_BASED_OUTPATIENT_CLINIC_OR_DEPARTMENT_OTHER): Payer: Self-pay

## 2020-12-12 DIAGNOSIS — Z23 Encounter for immunization: Secondary | ICD-10-CM | POA: Diagnosis not present

## 2020-12-12 MED ORDER — PFIZER COVID-19 VAC BIVALENT 30 MCG/0.3ML IM SUSP
INTRAMUSCULAR | 0 refills | Status: DC
  Filled 2020-12-12: qty 0.3, 1d supply, fill #0

## 2020-12-21 ENCOUNTER — Encounter: Payer: Self-pay | Admitting: Neurology

## 2020-12-21 ENCOUNTER — Ambulatory Visit (INDEPENDENT_AMBULATORY_CARE_PROVIDER_SITE_OTHER): Payer: Medicare Other | Admitting: Neurology

## 2020-12-21 VITALS — BP 148/71 | HR 67 | Ht 70.0 in | Wt 262.6 lb

## 2020-12-21 DIAGNOSIS — Z9989 Dependence on other enabling machines and devices: Secondary | ICD-10-CM

## 2020-12-21 DIAGNOSIS — G4733 Obstructive sleep apnea (adult) (pediatric): Secondary | ICD-10-CM

## 2020-12-21 NOTE — Progress Notes (Signed)
Subjective:    Patient ID: Dale [name], DDS is a 70 y.o. male.  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  The patient is unaccompanied today and presents for his yearly check up. I last saw him on 10/14/2019, at which time he was compliant with his CPAP and doing well.  He did have some residual snoring and we mutually agreed to increase his pressure from 10 cm to 11 cm.  He was advised to follow-up routinely in 1 year.  Today, 12/21/2020: I reviewed his CPAP compliance data from the past 30 days, between 11/21/2020 through 12/20/2020, during which time he used his machine every night with percent use days greater than 4 hours at 100%, indicating superb compliance, average usage of 8 hours and 27 minutes, residual AHI low at 0.3/h, leak low with a 95th percentile at 1.9 L/min on a pressure of 11 cm with EPR of 3.  He reports doing well, wife does note residual snoring from time to time but it does not bother him at all and he has noticed when we increase the pressure from 10 to 11 cm that he has to be careful with leak from the mask and sometimes he has to tighten the mask more.  He uses a full facemask.  He is up-to-date with his supplies.  He has no new complaints today and feels well with his CPAP, continues to benefit from it.   The patient's allergies, current medications, family history, past medical history, past social history, past surgical history and problem list were reviewed and updated as appropriate.    Previously:   I saw him on 10/08/2018, at which time he was fully compliant with his CPAP.  He had some residual snoring and we mutually agreed to increase his  pressure from 9 cm to 10 cm at the time.   I reviewed his CPAP compliance data from 09/13/2019 through 10/12/2019, which is a total of 30 days, during which time he used his machine every night with percent use days greater than 4 hours at 100%, indicating superb compliance with an average usage of 8 hours and 5 minutes, residual AHI at goal at 0.3/h, leak on the low side with a 95th percentile at 1.5 L/min on a pressure of 10 cm with EPR of 3.     10/02/2017, at which time he had started using his new CPAP machine.  He was compliant with treatment.  He was advised to follow-up yearly.     I reviewed his CPAP compliance data from 09/07/2018 through 10/06/2018 which is a total of 30 days, during which time he used his machine every night with percent use days greater than 4 hours at 100%, indicating superb compliance with an average usage of 7 hours and 49 minutes, residual AHI 0.5/h, at goal, leak on the low side with a 95th percentile at 2.4 L/min on a pressure of 9 cm with EPR of 3.      I first met him on 11/14/2016 at the request of his primary care physician, at which time he reported a prior diagnosis of obstructive sleep apnea but needed reevaluation as it had been several years and he had an older CPAP machine. He was advised to proceed with  sleep study testing. He had a baseline sleep study, followed by a CPAP titration study. I went over his test results with him in detail today. Baseline sleep study from 01/23/2017 showed a sleep efficiency of 80.1 minutes, sleep latency of 5.5 minutes and REM latency mildly delayed at 147 minutes. He had an increased percentage of slow-wave sleep and a decreased percentage of REM sleep. Total AHI was 12.6 per hour, REM AHI was 78.9 per hour. Average oxygen saturation was 96%, nadir was 83%. He had no significant PLMS. He was advised to proceed with a CPAP titration study. He had this on 03/27/2017. Sleep efficiency was 99.2%, sleep latency 1.5 minutes and REM latency  normal at 81 minutes. He had a normal percentage of slow-wave sleep and REM sleep was near normal at 18.4%. He was fitted with a small full face mask and CPAP was titrated from 5 cm to 9 cm. On the final titration pressure his AHI was 0 per hour with brief supine REM sleep achieved an O2 nadir of 91%. Based on his test results I prescribed CPAP therapy for home use with a new machine and new equipment at a pressure of 9 cm.   I reviewed his CPAP compliance data from 09/01/2017 through 09/30/2017 which is a total of 30 days, during which time he used his CPAP 29 days with percent used days greater than 4 hours at 97%, indicating excellent compliance with an average usage of 7 hours and 9 minutes, residual AHI of 0.4 per hour, leak on the low side with the 95th percentile at 5.9 L/m on a pressure of 9 cm with EPR of 3.    11/14/2016: (He) was previously diagnosed with obstructive sleep apnea and placed on CPAP therapy. His sleep study was over 10 years ago, prior study results are not available for my review today. A CPAP download was not available for my review today as the machine is older. I reviewed your office note from 08/23/2016. His Epworth sleepiness score is 7 out of 24, fatigue score is 10 out of 63. He lives with his wife and son. He has 3 children. He works as a Pharmacist, community. He quit smoking in 1979 and drinks alcohol about 3 times a week and reports drinking caffeine "a lot": multiple cups of coffee per day. His wife reports that he is not necessarily all that well rested, when he first started on CPAP some 13 years ago he had a great response to it, felt a big difference in his daytime energy. He does report an approximately 30 pound weight gain since his original diagnosis. He has 2 daughters, ages 21 and 65, their 33 year old son lives with them. He had issues with leg cramps for a while. He temporarily stopped his atorvastatin at the time but has been able to restart it. Denies FHx of OSA and has no  Sx of RLS. He has no night to night nocturia and denies morning headaches.  His Past Medical History Is Significant For: Past Medical History:  Diagnosis Date   BCC (basal cell carcinoma of skin)    dr Allyson Sabal   BPH (benign prostatic hyperplasia)    Decreased hearing    Left, saw audiologist in 2011 per pt    DJD (degenerative joint disease)    Elevated liver function tests    Hyperlipidemia    mixed   Hypertension    Obesity    Ocular rosacea    on doxy as of 01-2013   OSA (  obstructive sleep apnea)    on CPAP   Tubular adenoma of colon 2017    His Past Surgical History Is Significant For: Past Surgical History:  Procedure Laterality Date   COLONOSCOPY W/ POLYPECTOMY  10/20/2015   tubular adenoma   TONSILLECTOMY      His Family History Is Significant For: Family History  Problem Relation Age of Onset   Prostate cancer Father 37       age 48s died at 16, also an UNCLE   Coronary artery disease Paternal Grandmother    Diabetes Mother    Hypertension Mother    Heart failure Maternal Grandmother    Esophageal cancer Paternal Grandfather    Colon cancer Neg Hx    Stroke Neg Hx     His Social History Is Significant For: Social History   Socioeconomic History   Marital status: Married    Spouse name: Not on file   Number of children: 3   Years of education: Not on file   Highest education level: Professional school degree (e.g., MD, DDS, DVM, JD)  Occupational History   Occupation: dentist  Tobacco Use   Smoking status: Former    Types: Cigarettes    Quit date: 11/21/1968    Years since quitting: 52.1   Smokeless tobacco: Never   Tobacco comments:    quit late 70s  Vaping Use   Vaping Use: Never used  Substance and Sexual Activity   Alcohol use: Yes    Comment: 2 cocktails per week   Drug use: No   Sexual activity: Not on file  Other Topics Concern   Not on file  Social History Narrative   Lives with his wife in a 2 story home with a basement.  Has 3  children.  Works as a Pharmacist, community.  Education: Associate Professor.    Social Determinants of Health   Financial Resource Strain: Not on file  Food Insecurity: Not on file  Transportation Needs: Not on file  Physical Activity: Not on file  Stress: Not on file  Social Connections: Not on file    His Allergies Are:  No Known Allergies:   His Current Medications Are:  Outpatient Encounter Medications as of 12/21/2020  Medication Sig   atorvastatin (LIPITOR) 10 MG tablet TAKE ONE TABLET BY MOUTH AT BEDTIME.   Cholecalciferol (VITAMIN D-3) 25 MCG (1000 UT) CAPS Take by mouth.   COVID-19 mRNA bivalent vaccine, Pfizer, (PFIZER COVID-19 VAC BIVALENT) injection Inject into the muscle.   COVID-19 mRNA Vac-TriS, Pfizer, (PFIZER-BIONT COVID-19 VAC-TRIS) SUSP injection Inject into the muscle.   Cyanocobalamin (B-12 PO) Take by mouth.   fish oil-omega-3 fatty acids 1000 MG capsule Take 1 g by mouth daily.   ibuprofen (ADVIL,MOTRIN) 800 MG tablet Take 800 mg by mouth every 8 (eight) hours as needed.   tamsulosin (FLOMAX) 0.4 MG CAPS capsule Take 0.8 mg by mouth daily.    Zoster Vaccine Adjuvanted [facility]) injection Inject into the muscle.   No facility-administered encounter medications on file as of 12/21/2020.  :  Review of Systems:  Out of a complete 14 point review of systems, all are reviewed and negative with the exception of these symptoms as listed below:   Review of Systems  Neurological:        Doing ok, no issues.     Objective:  Neurological Exam  Physical Exam Physical Examination:   Vitals:   12/21/20 1442  BP: (!) 148/71  Pulse: 67    General Examination: The  patient is a very pleasant 70 y.o. male in no acute distress. He appears well-developed and well-nourished and well groomed.   HEENT: Normocephalic, atraumatic, pupils are equal, round and reactive to light, extraocular tracking is good without limitation to gaze excursion or nystagmus noted. Normal smooth pursuit is  noted. Hearing is mildly impaired. Face is symmetric with normal facial animation and normal facial sensation. Speech is clear with no dysarthria noted. There is no hypophonia. There is no lip, neck/head, jaw or voice tremor.   Chest: Clear to auscultation without wheezing, rhonchi or crackles noted.   Heart: S1+S2+0, regular and normal without murmurs, rubs or gallops noted.    Abdomen: Soft, non-tender and non-distended.   Extremities: There is no obvious edema in the distal lower extremities bilaterally.   Skin: Warm and dry without trophic changes noted. Rosacea around nose, stable.    Musculoskeletal: exam reveals no obvious joint deformities.    Neurologically:  Mental status: The patient is awake, alert and oriented in all 4 spheres. His immediate and remote memory, attention, language skills and fund of knowledge are appropriate. There is no evidence of aphasia, agnosia, apraxia or anomia. Speech is clear with normal prosody and enunciation. Thought process is linear. Mood is normal and affect is normal.  Cranial nerves II - XII are as described above under HEENT exam.  Motor exam: Normal bulk, strength and tone is noted. There is tremor. Fine motor skills and coordination: grossly intact.  Cerebellar testing: No dysmetria or intention tremor. There is no truncal or gait ataxia.  Sensory exam: intact to light touch in the upper and lower extremities.  Gait, station and balance: He stands easily. No veering to one side is noted. No leaning to one side is noted. Posture is age-appropriate and stance is narrow based. Gait shows normal stride length and normal pace. No problems turning are noted.         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 We increased the pressure last year from 10 to 11 cm due to residual snoring.  He still reports intermittent residual snoring per wife's report, but he sleeps well with it.  I suggested we keep his treatment pressure currently at 11 cm as he has noticed a leak and the need to tighten his mask a little bit more.  I think his residual snoring is going to be difficult to treat without increasing discomfort from excess pressure or increasing the leak.  He is agreeable to maintaining treatment at 11 cm.  He is fully compliant and continues to benefit from treatment.  He is up-to-date with his supplies.  He is advised to follow-up routinely in 1 year.   I answered all his questions today and he was in agreement. I spent 20 minutes in total face-to-face time and in reviewing records during pre-charting, more than 50% of which was spent in counseling and coordination of care, reviewing test results, reviewing medications and treatment regimen and/or in discussing or reviewing the diagnosis of OSA, the prognosis and treatment options. Pertinent laboratory and imaging test results that were available during this visit with the patient were reviewed by me and considered in my medical decision making (see chart for details).

## 2020-12-22 ENCOUNTER — Ambulatory Visit (INDEPENDENT_AMBULATORY_CARE_PROVIDER_SITE_OTHER): Payer: Medicare Other | Admitting: Internal Medicine

## 2020-12-22 ENCOUNTER — Encounter: Payer: Self-pay | Admitting: Internal Medicine

## 2020-12-22 VITALS — BP 134/76 | HR 60 | Temp 97.6°F | Resp 18 | Ht 71.0 in | Wt 262.1 lb

## 2020-12-22 DIAGNOSIS — R739 Hyperglycemia, unspecified: Secondary | ICD-10-CM | POA: Diagnosis not present

## 2020-12-22 DIAGNOSIS — Z23 Encounter for immunization: Secondary | ICD-10-CM

## 2020-12-22 DIAGNOSIS — E785 Hyperlipidemia, unspecified: Secondary | ICD-10-CM | POA: Diagnosis not present

## 2020-12-22 LAB — COMPREHENSIVE METABOLIC PANEL
ALT: 61 U/L — ABNORMAL HIGH (ref 0–53)
AST: 39 U/L — ABNORMAL HIGH (ref 0–37)
Albumin: 4.4 g/dL (ref 3.5–5.2)
Alkaline Phosphatase: 47 U/L (ref 39–117)
BUN: 17 mg/dL (ref 6–23)
CO2: 30 mEq/L (ref 19–32)
Calcium: 9.6 mg/dL (ref 8.4–10.5)
Chloride: 105 mEq/L (ref 96–112)
Creatinine, Ser: 0.99 mg/dL (ref 0.40–1.50)
GFR: 76.01 mL/min (ref >60.00)
Glucose, Bld: 92 mg/dL (ref 70–99)
Potassium: 4.8 mEq/L (ref 3.5–5.1)
Sodium: 140 mEq/L (ref 135–145)
Total Bilirubin: 1 mg/dL (ref 0.2–1.2)
Total Protein: 6.8 g/dL (ref 6.0–8.3)

## 2020-12-22 LAB — LIPID PANEL
Cholesterol: 140 mg/dL (ref 0–200)
HDL: 42.5 mg/dL (ref >39.00)
LDL Cholesterol: 78 mg/dL (ref 0–99)
NonHDL: 97.34
Total CHOL/HDL Ratio: 3
Triglycerides: 97 mg/dL (ref 0.0–149.0)
VLDL: 19.4 mg/dL (ref 0.0–40.0)

## 2020-12-22 LAB — HEMOGLOBIN A1C: Hgb A1c MFr Bld: 5.5 % (ref 4.6–6.5)

## 2020-12-22 NOTE — Assessment & Plan Note (Signed)
Elevated BP: BP today satisfactory, recommend to monitor from time to time Hyperlipidemia: On atorvastatin, check CMP, FLP, A1c. Elevated LFTs: Checking labs Morbid obesity, OSA: Good CPAP compliance, Long discussion about diet exercise. Preventive care reviewed RTC 6 months

## 2020-12-22 NOTE — Patient Instructions (Addendum)
Check the  blood pressure monthly BP GOAL is between 110/65 and  135/85. If it is consistently higher or lower, let me know     GO TO THE LAB : Get the blood work     [place], [place] back for   a checkup in 6 months

## 2020-12-22 NOTE — Progress Notes (Signed)
Subjective:    Patient ID: Dale [name], DDS, male    DOB: May 17, 1950, 70 y.o.   MRN: [id number]  DOS:  12/22/2020 Type of visit - description: Follow-up Since the last office visit he is doing okay. Has chronic DJD, neck pain, mobility issues. Unable to lose weight. Denies chest pain or difficulty breathing No major GI or GU symptoms.   Wt Readings from Last 3 Encounters:  12/22/20 262 lb 2 oz (118.9 kg)  12/21/20 262 lb 9.6 oz (119.1 kg)  06/16/20 263 lb 6.4 oz (119.5 kg)    Review of Systems See above   Past Medical History:  Diagnosis Date   BCC (basal cell carcinoma of skin)    dr Allyson Sabal   BPH (benign prostatic hyperplasia)    Decreased hearing    Left, saw audiologist in 2011 per pt    DJD (degenerative joint disease)    Elevated liver function tests    Hyperlipidemia    mixed   Hypertension    Obesity    Ocular rosacea    on doxy as of 01-2013   OSA (obstructive sleep apnea)    on CPAP   Tubular adenoma of colon 2017    Past Surgical History:  Procedure Laterality Date   COLONOSCOPY W/ POLYPECTOMY  10/20/2015   tubular adenoma   TONSILLECTOMY     Social History   Socioeconomic History   Marital status: Married    Spouse name: Not on file   Number of children: 3   Years of education: Not on file   Highest education level: Professional school degree (e.g., MD, DDS, DVM, JD)  Occupational History   Occupation: dentist  Tobacco Use   Smoking status: Former    Types: Cigarettes    Quit date: 11/21/1968    Years since quitting: 52.1   Smokeless tobacco: Never   Tobacco comments:    quit late 70s  Vaping Use   Vaping Use: Never used  Substance and Sexual Activity   Alcohol use: Yes    Comment: 2 cocktails per week   Drug use: No   Sexual activity: Not on file  Other Topics Concern   Not on file  Social History Narrative   Lives with his wife in a 2 story home with a basement.  Has 3 children.  Works as a Pharmacist, community.  Education: Psychologist, sport and exercise.    Social Determinants of Health   Financial Resource Strain: Not on file  Food Insecurity: Not on file  Transportation Needs: Not on file  Physical Activity: Not on file  Stress: Not on file  Social Connections: Not on file  Intimate Partner Violence: Not on file    Allergies as of 12/22/2020   No Known Allergies      Medication List        Accurate as of December 22, 2020  4:59 PM. If you have any questions, ask your nurse or doctor.          STOP taking these medications    Pfizer-BioNT COVID-19 Vac-TriS Susp injection Generic drug: COVID-19 mRNA Vac-TriS Therapist, music) Stopped by: Kathlene November, MD   Shingrix injection Generic drug: Zoster Vaccine Adjuvanted Stopped by: Kathlene November, MD       TAKE these medications    atorvastatin 10 MG tablet Commonly known as: LIPITOR TAKE ONE TABLET BY MOUTH AT BEDTIME.   B-12 PO Take by mouth.   fish oil-omega-3 fatty acids 1000 MG capsule Take 1 g by mouth daily.  ibuprofen 800 MG tablet Commonly known as: ADVIL Take 800 mg by mouth every 8 (eight) hours as needed.   Pfizer COVID-19 Vac Bivalent injection Generic drug: COVID-19 mRNA bivalent vaccine Therapist, music) Inject into the muscle.   tamsulosin 0.4 MG Caps capsule Commonly known as: FLOMAX Take 0.8 mg by mouth daily.   Vitamin D-3 25 MCG (1000 UT) Caps Take by mouth.           Objective:   Physical Exam BP 134/76 (BP Location: Right Arm, Patient Position: Sitting, Cuff Size: Normal)   Pulse 60   Temp 97.6 F (36.4 C) (Oral)   Resp 18   Ht 5\' 11"  (1.803 m)   Wt 262 lb 2 oz (118.9 kg)   SpO2 97%   BMI 36.56 kg/m  General: Well developed, NAD, BMI noted Neck: No  thyromegaly  HEENT:  Normocephalic . Face symmetric, atraumatic Lungs:  CTA B Normal respiratory effort, no intercostal retractions, no accessory muscle use. Heart: RRR,  no murmur.  Abdomen:  Not distended, soft, non-tender. No rebound or rigidity.   Lower extremities: no  pretibial edema bilaterally  Skin: Exposed areas without rash. Not pale. Not jaundice Neurologic:  alert & oriented X3.  Speech normal, gait appropriate for age and unassisted Strength symmetric and appropriate for age.  Psych: Cognition and judgment appear intact.  Cooperative with normal attention span and concentration.  Behavior appropriate. No anxious or depressed appearing.     Assessment      Assessment Elevated BP Hyperlipidemia Elevated LFTs DJD Weaker L leg, saw neuro, NCS 01/2018 chronic L 3 radiculopathy Morbid obesity: BMI 36 + OSA OSA, + CPAP BCC, used to see Dr Franchot Erichsen rosacea HOH, L  BPH w/LUTS sees urology Aortic ultrasound: 05/2020, no AAA  PLAN: Elevated BP: BP today satisfactory, recommend to monitor from time to time Hyperlipidemia: On atorvastatin, check CMP, FLP, A1c. Elevated LFTs: Checking labs Morbid obesity, OSA: Good CPAP compliance, Long discussion about diet exercise. Preventive care reviewed RTC 6 months     This visit occurred during the SARS-CoV-2 public health emergency.  Safety protocols were in place, including screening questions prior to the visit, additional usage of staff PPE, and extensive cleaning of exam room while observing appropriate contact time as indicated for disinfecting solutions.

## 2020-12-22 NOTE — Assessment & Plan Note (Signed)
-  Tdap 09-2018 -zostavax ~ 2010; s/p shingrix x 2 - PNM 23:2016; PNM 13: 2017; pnm 23 booster today - had a Covid  booster -Had a flu shot  -CCS: Cscope w/ Dr Fredda Hammed ([place])  ~ 2008, had polyps, redundant colon Cscope 07/2015, wnl per pt, no report ; rec to call GI -Sees urology regulalrly -Advance care planning info provided

## 2021-01-01 ENCOUNTER — Encounter (HOSPITAL_BASED_OUTPATIENT_CLINIC_OR_DEPARTMENT_OTHER): Payer: Self-pay | Admitting: *Deleted

## 2021-01-01 ENCOUNTER — Telehealth: Payer: Self-pay | Admitting: Internal Medicine

## 2021-01-01 ENCOUNTER — Emergency Department (HOSPITAL_BASED_OUTPATIENT_CLINIC_OR_DEPARTMENT_OTHER): Payer: Medicare Other

## 2021-01-01 ENCOUNTER — Other Ambulatory Visit: Payer: Self-pay

## 2021-01-01 ENCOUNTER — Emergency Department (HOSPITAL_BASED_OUTPATIENT_CLINIC_OR_DEPARTMENT_OTHER)
Admission: EM | Admit: 2021-01-01 | Discharge: 2021-01-01 | Disposition: A | Discharge status: Home / Self Care | Payer: Medicare Other | Attending: Emergency Medicine | Admitting: Emergency Medicine

## 2021-01-01 ENCOUNTER — Other Ambulatory Visit (HOSPITAL_BASED_OUTPATIENT_CLINIC_OR_DEPARTMENT_OTHER): Payer: Self-pay

## 2021-01-01 DIAGNOSIS — Z87891 Personal history of nicotine dependence: Secondary | ICD-10-CM | POA: Insufficient documentation

## 2021-01-01 DIAGNOSIS — M7989 Other specified soft tissue disorders: Secondary | ICD-10-CM | POA: Insufficient documentation

## 2021-01-01 DIAGNOSIS — Z85828 Personal history of other malignant neoplasm of skin: Secondary | ICD-10-CM | POA: Diagnosis not present

## 2021-01-01 DIAGNOSIS — I10 Essential (primary) hypertension: Secondary | ICD-10-CM | POA: Diagnosis not present

## 2021-01-01 DIAGNOSIS — M79642 Pain in left hand: Secondary | ICD-10-CM | POA: Diagnosis not present

## 2021-01-01 DIAGNOSIS — R52 Pain, unspecified: Secondary | ICD-10-CM

## 2021-01-01 LAB — CBC WITH DIFFERENTIAL/PLATELET
Abs Immature Granulocytes: 0.03 10*3/uL (ref 0.00–0.07)
Basophils Absolute: 0.1 10*3/uL (ref 0.0–0.1)
Basophils Relative: 1 %
Eosinophils Absolute: 0.4 10*3/uL (ref 0.0–0.5)
Eosinophils Relative: 6 %
HCT: 43.3 % (ref 39.0–52.0)
Hemoglobin: 14.7 g/dL (ref 13.0–17.0)
Immature Granulocytes: 0 %
Lymphocytes Relative: 28 %
Lymphs Abs: 1.9 10*3/uL (ref 0.7–4.0)
MCH: 29.4 pg (ref 26.0–34.0)
MCHC: 33.9 g/dL (ref 30.0–36.0)
MCV: 86.6 fL (ref 80.0–100.0)
Monocytes Absolute: 0.5 10*3/uL (ref 0.1–1.0)
Monocytes Relative: 8 %
Neutro Abs: 3.9 10*3/uL (ref 1.7–7.7)
Neutrophils Relative %: 57 %
Platelets: 146 10*3/uL — ABNORMAL LOW (ref 150–400)
RBC: 5 MIL/uL (ref 4.22–5.81)
RDW: 13.1 % (ref 11.5–15.5)
WBC: 6.8 10*3/uL (ref 4.0–10.5)
nRBC: 0 % (ref 0.0–0.2)

## 2021-01-01 LAB — BASIC METABOLIC PANEL
Anion gap: 7 (ref 5–15)
BUN: 13 mg/dL (ref 8–23)
CO2: 25 mmol/L (ref 22–32)
Calcium: 9.3 mg/dL (ref 8.9–10.3)
Chloride: 106 mmol/L (ref 98–111)
Creatinine, Ser: 0.9 mg/dL (ref 0.61–1.24)
GFR, Estimated: >60 mL/min (ref >60)
Glucose, Bld: 98 mg/dL (ref 70–99)
Potassium: 4.3 mmol/L (ref 3.5–5.1)
Sodium: 138 mmol/L (ref 135–145)

## 2021-01-01 MED ORDER — PREDNISONE 10 MG PO TABS
40.0000 mg | ORAL_TABLET | Freq: Every day | ORAL | 0 refills | Status: DC
  Filled 2021-01-01: qty 20, 5d supply, fill #0

## 2021-01-01 MED ORDER — DOXYCYCLINE HYCLATE 100 MG PO CAPS
100.0000 mg | ORAL_CAPSULE | Freq: Two times a day (BID) | ORAL | 0 refills | Status: DC
  Filled 2021-01-01: qty 14, 7d supply, fill #0

## 2021-01-01 NOTE — Telephone Encounter (Signed)
Nurse Assessment Nurse: Alvis Lemmings, RN, Marcie Bal Date/Time Eilene Ghazi Time): 01/01/2021 9:43:06 AM Confirm and document reason for call. If symptomatic, describe symptoms. ---Call sent from office, no appts. available. Caller states her husband has swelling in his left arm that is moving into his hand, is painful, hot and red. It started with a lump. No injury known. Forearm area, back area, and now radiating to hand. Not at home. Does the patient have any new or worsening symptoms? ---Yes Will a triage be completed? ---Yes Related visit to physician within the last 2 weeks? ---No Does the PT have any chronic conditions? (i.e. diabetes, asthma, this includes High risk factors for pregnancy, etc.) ---No Is this a behavioral health or substance abuse call? ---No Guidelines Guideline Title Affirmed Question Affirmed Notes Nurse Date/Time (Eastern Time) Arm Pain [1] Red area or streak AND [2] large (> 2 in. or 5 cm) Alvis Lemmings, RN, Marcie Bal 01/01/2021 9:45:55 AM Disp. Time Eilene Ghazi Time) Disposition Final User PLEASE NOTE: All timestamps contained within this report are represented as [place] Standard Time. CONFIDENTIALTY NOTICE: This fax transmission is intended only for the addressee. It contains information that is legally privileged, confidential or otherwise protected from use or disclosure. If you are not the intended recipient, you are strictly prohibited from reviewing, disclosing, copying using or disseminating any of this information or taking any action in reliance on or regarding this information. If you have received this fax in error, please notify us immediately by telephone so that we can arrange for its return to [name]. Phone: [phone], Toll-Free: [phone], Fax: [phone] Page: 2 of 2 Call Id: [id number] 01/01/2021 9:49:03 AM See HCP within 4 Hours (or PCP triage) Yes Alvis Lemmings, RN, Lenon Oms Disagree/Comply Comply Caller Understands Yes PreDisposition Call  Doctor Care Advice Given Per Guideline SEE HCP (OR PCP TRIAGE) WITHIN 4 HOURS: * IF OFFICE WILL BE OPEN: You need to be seen within the next 3 or 4 hours. Call your doctor (or NP/PA) now or as soon as the office opens. CARE ADVICE given per Arm Pain (Adult) guideline. CALL BACK IF: * You become worse Referrals REFERRED TO PCP OFFICE Warm transfer to backline

## 2021-01-01 NOTE — ED Triage Notes (Signed)
Swelling and redness to his left hand. He feels it is cellulitis.

## 2021-01-01 NOTE — Telephone Encounter (Signed)
Pt's wife states his left arm has been swelling and sensitive to the touch, she was transferred to triage please advise.

## 2021-01-01 NOTE — Discharge Instructions (Signed)
Symptoms suggest of of an inflammatory arthritis.  But cellulitis not completely ruled out.  Labs without significant abnormalities.  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  She may have some osteoarthritis in the right wrist as well that is asymptomatic.  Take the antibiotic doxycycline as directed.  Recommended to take the prednisone 40 mg a day for the next 5 days.  Return for any new or worse symptoms.

## 2021-01-01 NOTE — ED Provider Notes (Signed)
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  Patient with complaint of redness and swelling to the left hand.  Mostly on the dorsal surface but some on the anterior surface of the forearm.  Had severe pain kind of distal forearm wrist area over the weekend.  And that pain is improved but now has erythema that goes to the MP joint of the hand and then on the anterior surface about halfway up the forearm.  Associated with some increased warmth.  No significant swelling to the fingers.  No significant swelling to the forearm.  Patient states he has had a longstanding lump kind of over the ulnar area of the forearm for years that he was told may be a lipoma.  Patient without any history of arthritis or gout.  No fever.  Past medical history significant for hypertension hyperlipidemia.      Past Medical History:  Diagnosis Date   BCC (basal cell carcinoma of skin)    dr Allyson Sabal   BPH (benign prostatic hyperplasia)    Decreased hearing    Left, saw audiologist in 2011 per pt    DJD (degenerative joint disease)    Elevated liver function tests    Hyperlipidemia    mixed   Hypertension    Obesity    Ocular rosacea    on doxy as of 01-2013   OSA (obstructive sleep apnea)    on CPAP   Tubular adenoma of colon 2017    Patient Active Problem List   Diagnosis Date Noted   PCP NOTES >>>>>>>>>>>>> 08/18/2015   BCC (basal cell carcinoma of skin)    Ocular rosacea    Annual physical exam 08/31/2010   NOCTURIA 09/22/2009   Obstructive sleep apnea 09/15/2009   DECREASED HEARING 06/13/2009   Elevated LFTs 12/09/2008   DJD (degenerative joint disease) 06/03/2008   Dyslipidemia 05/11/2008   Morbid obesity ([place]) 05/11/2008   Elevated blood pressure reading without diagnosis of hypertension 05/11/2008    Past  Surgical History:  Procedure Laterality Date   COLONOSCOPY W/ POLYPECTOMY  10/20/2015   tubular adenoma   TONSILLECTOMY         Family History  Problem Relation Age of Onset   Prostate cancer Father 41       age 24s died at 27, also an UNCLE   Coronary artery disease Paternal Grandmother    Diabetes Mother    Hypertension Mother    Heart failure Maternal Grandmother    Esophageal cancer Paternal Grandfather    Colon cancer Neg Hx    Stroke Neg Hx     Social History   Tobacco Use   Smoking status: Former    Types: Cigarettes    Quit date: 11/21/1968    Years since quitting: 52.1   Smokeless tobacco: Never   Tobacco comments:    quit late 70s  Vaping Use   Vaping Use: Never used  Substance Use Topics   Alcohol use: Yes    Comment: 2 cocktails per week   Drug use: No    Home Medications Prior to Admission medications   Medication Sig Start Date End Date Taking? Authorizing Provider  atorvastatin (LIPITOR) 10 MG tablet TAKE ONE TABLET BY MOUTH AT BEDTIME. 10/02/20  Yes Colon Branch, MD  Cholecalciferol (  VITAMIN D-3) 25 MCG (1000 UT) CAPS Take by mouth.   Yes [provider]  COVID-19 mRNA bivalent vaccine, Pfizer, (PFIZER COVID-19 VAC BIVALENT) injection Inject into the muscle. 11/24/20  Yes Carlyle Basques, MD  Cyanocobalamin (B-12 PO) Take by mouth.   Yes [provider]  doxycycline (VIBRAMYCIN) 100 MG capsule Take 1 capsule (100 mg total) by mouth 2 (two) times daily. 01/01/21  Yes Fredia Sorrow, MD  fish oil-omega-3 fatty acids 1000 MG capsule Take 1 g by mouth daily.   Yes [provider]  ibuprofen (ADVIL,MOTRIN) 800 MG tablet Take 800 mg by mouth every 8 (eight) hours as needed.   Yes [provider]  predniSONE (DELTASONE) 10 MG tablet Take 4 tablets (40 mg total) by mouth daily. 01/01/21  Yes Fredia Sorrow, MD  tamsulosin (FLOMAX) 0.4 MG CAPS capsule Take 0.8 mg by mouth daily.    Yes [provider]     Allergies    Patient has no known allergies.  Review of Systems   Review of Systems  Constitutional:  Negative for chills and fever.  HENT:  Negative for ear pain and sore throat.   Eyes:  Negative for pain and visual disturbance.  Respiratory:  Negative for cough and shortness of breath.   Cardiovascular:  Negative for chest pain and palpitations.  Gastrointestinal:  Negative for abdominal pain and vomiting.  Genitourinary:  Negative for dysuria and hematuria.  Musculoskeletal:  Positive for joint swelling. Negative for arthralgias and back pain.  Skin:  Negative for color change and rash.  Neurological:  Negative for seizures and syncope.  All other systems reviewed and are negative.  Physical Exam Updated Vital Signs BP (!) 162/83 (BP Location: Right Arm)   Pulse (!) 58   Temp 97.8 F (36.6 C) (Oral)   Resp 18   Ht 1.803 m (5\' 11" )   Wt 118.9 kg   SpO2 100%   BMI 36.56 kg/m   Physical Exam Vitals and nursing note reviewed.  Constitutional:      General: He is not in acute distress.    Appearance: Normal appearance. He is well-developed.  HENT:     Head: Normocephalic and atraumatic.  Eyes:     Extraocular Movements: Extraocular movements intact.     Conjunctiva/sclera: Conjunctivae normal.  Cardiovascular:     Rate and Rhythm: Normal rate and regular rhythm.     Heart sounds: No murmur heard. Pulmonary:     Effort: Pulmonary effort is normal. No respiratory distress.     Breath sounds: Normal breath sounds.  Abdominal:     Palpations: Abdomen is soft.     Tenderness: There is no abdominal tenderness.  Musculoskeletal:        General: Swelling present. No deformity or signs of injury.     Cervical back: Neck supple.     Comments: Remedies without any significant abnormalities other than the left upper extremity.  Over the distal ulnar area there is a soft tissue swelling.  Patient states is been there for years.  There is erythema to the back of the left  hand.  Down to the MP joint.  And some redness at the wrist area.  Increased warmth may be some slight swelling to the hand.  No swelling to the fingers.  Sensory is intact to the hands.  Radial pulses 2+.  On the anterior side there is some erythema on the forearm up about halfway up.  No proximal forearm swelling.  Skin:  General: Skin is warm and dry.     Capillary Refill: Capillary refill takes less than 2 seconds.  Neurological:     General: No focal deficit present.     Mental Status: He is alert and oriented to person, place, and time.  Psychiatric:        Mood and Affect: Mood normal.    ED Results / Procedures / Treatments   Labs (all labs ordered are listed, but only abnormal results are displayed) Labs Reviewed  CBC WITH DIFFERENTIAL/PLATELET - Abnormal; Notable for the following components:      Result Value   Platelets 146 (*)    All other components within normal limits  BASIC METABOLIC PANEL    EKG None  Radiology DG Forearm Left  Result Date: 01/01/2021 CLINICAL DATA:  Left upper extremity swelling, erythema, sensitive to touch for 2 days EXAM: LEFT HAND - COMPLETE 3+ VIEW; LEFT FOREARM - 2 VIEW; LEFT WRIST - COMPLETE 3+ VIEW COMPARISON:  None. FINDINGS: Left hand: Frontal, oblique, and lateral views are obtained. No fracture, subluxation, or dislocation. There is mild joint space narrowing within the radial aspect of the wrist. No erosive changes. Soft tissues are unremarkable. Left wrist: Frontal, oblique, lateral, and ulnar deviated views are obtained. No fracture, subluxation, or dislocation. There is joint space narrowing within the radial aspect of the carpus. Soft tissues are unremarkable. Left forearm: Frontal and lateral views are obtained. No fracture, subluxation, or dislocation. Joint spaces are well preserved. There is mild dorsal soft tissue swelling of the distal forearm. IMPRESSION: 1. Mild soft tissue swelling distal left forearm. 2. No acute displaced  fractures. 3. 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 Electronically Signed   By: Randa Ngo M.D.   On: 01/01/2021 15:06   DG Wrist Complete Left  Result Date: 01/01/2021 CLINICAL DATA:  Left upper extremity swelling, erythema, sensitive to touch for 2 days EXAM: LEFT HAND - COMPLETE 3+ VIEW; LEFT FOREARM - 2 VIEW; LEFT WRIST - COMPLETE 3+ VIEW COMPARISON:  None. FINDINGS: Left hand: Frontal, oblique, and lateral views are obtained. No fracture, subluxation, or dislocation. There is mild joint space narrowing within the radial aspect of the wrist. No erosive changes. Soft tissues are unremarkable. Left wrist: Frontal, oblique, lateral, and ulnar deviated views are obtained. No fracture, subluxation, or dislocation. There is joint space narrowing within the radial aspect of the carpus. Soft tissues are unremarkable. Left forearm: Frontal and lateral views are obtained. No fracture, subluxation, or dislocation. Joint spaces are well preserved. There is mild dorsal soft tissue swelling of the distal forearm. IMPRESSION: 1. Mild soft tissue swelling distal left forearm. 2. No acute displaced fractures. 3. 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 Electronically Signed   By: Randa Ngo M.D.   On: 01/01/2021 15:06   DG Hand Complete Left  Result Date: 01/01/2021 CLINICAL DATA:  Left upper extremity swelling, erythema, sensitive to touch for 2 days EXAM: LEFT HAND - COMPLETE 3+ VIEW; LEFT FOREARM - 2 VIEW; LEFT WRIST - COMPLETE 3+ VIEW COMPARISON:  None. FINDINGS: Left hand: Frontal, oblique, and lateral views are obtained. No fracture, subluxation, or dislocation. There is mild joint space narrowing within the radial aspect of the wrist. No erosive changes. Soft tissues are unremarkable. Left wrist: Frontal, oblique, lateral, and ulnar deviated views are obtained. No fracture, subluxation, or dislocation. There is joint space narrowing within the radial aspect of the  carpus. Soft tissues are unremarkable. Left forearm: Frontal  and lateral views are obtained. No fracture, subluxation, or dislocation. Joint spaces are well preserved. There is mild dorsal soft tissue swelling of the distal forearm. IMPRESSION: 1. Mild soft tissue swelling distal left forearm. 2. No acute displaced fractures. 3. 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 Electronically Signed   By: Randa Ngo M.D.   On: 01/01/2021 15:06    Procedures Procedures   Medications Ordered in ED Medications - No data to display  ED Course  I have reviewed the triage vital signs and the nursing notes.  Pertinent labs & imaging results that were available during my care of the patient were reviewed by me and considered in my medical decision making (see chart for details).    MDM Rules/Calculators/A&P                          Clinically suggestive of an inflammatory arthritis.  Since there was severe pain and now things are improving without any intervention.  Normally cellulitis pain gets worse as the redness spreads.  Will get x-rays of the area.  Will probably treat both ways with doxycycline as well as prednisone.  And have patient follow with primary care doctor.  Clinically not concerned about deep vein thrombosis.  It does not seem to fit that at all.  Since there is no swelling to the fingers.  And the swelling is mostly on the dorsum of the hand.  X-rays of the left hand wrist and forearm do show soft tissue swelling and some osteoarthritis of the left wrist.  Again this may represent cellulitis clinically it seems to be more suggestive of inflammatory thrice.  We will treat patient with prednisone and doxycycline to cover both.  Patient will return for any new or worse symptoms.  Final Clinical Impression(s) / ED Diagnoses Final diagnoses:  Left hand pain    Rx / DC Orders ED Discharge Orders          Ordered    doxycycline (VIBRAMYCIN) 100 MG capsule  2 times  daily        01/01/21 1521    predniSONE (DELTASONE) 10 MG tablet  Daily        01/01/21 1521             Fredia Sorrow, MD 01/01/21 [id number]

## 2021-01-01 NOTE — Telephone Encounter (Signed)
No appts available. Pt instructed to go to urgent care.

## 2021-01-25 ENCOUNTER — Ambulatory Visit (INDEPENDENT_AMBULATORY_CARE_PROVIDER_SITE_OTHER): Payer: Medicare Other

## 2021-01-25 VITALS — BP 134/66 | HR 70 | Temp 97.9°F | Resp 16 | Ht 71.0 in | Wt 261.6 lb

## 2021-01-25 DIAGNOSIS — Z0000 Encounter for general adult medical examination without abnormal findings: Secondary | ICD-10-CM | POA: Diagnosis not present

## 2021-01-25 NOTE — Progress Notes (Signed)
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   Review of Systems     Cardiac Risk Factors include: advanced age (>9men, >56 women);male gender;hypertension;dyslipidemia;obesity (BMI >30kg/m2);sedentary lifestyle     Objective:    Today's Vitals   01/25/21 1453  BP: 134/66  Pulse: 70  Resp: 16  Temp: 97.9 F (36.6 C)  TempSrc: Oral  SpO2: 97%  Weight: 261 lb 9.6 oz (118.7 kg)  Height: 5\' 11"  (1.803 m)   Body mass index is 36.49 kg/m.  Advanced Directives 01/25/2021 01/01/2021 08/06/2019 03/20/2018 02/01/2016 02/01/2016  Does Patient Have a Medical Advance Directive? No No No Yes No No  Does patient want to make changes to medical advance directive? - - - No - Patient declined - -  Would patient like information on creating a medical advance directive? Yes (MAU/Ambulatory/Procedural Areas - Information given) No - Patient declined No - Patient declined - Yes (Inpatient - patient defers creating a medical advance directive at this time) -    Current Medications (verified) Outpatient Encounter Medications as of 01/25/2021  Medication Sig   atorvastatin (LIPITOR) 10 MG tablet TAKE ONE TABLET BY MOUTH AT BEDTIME.   Cholecalciferol (VITAMIN D-3) 25 MCG (1000 UT) CAPS Take by mouth.   Cyanocobalamin (B-12 PO) Take by mouth.   fish oil-omega-3 fatty acids 1000 MG capsule Take 1 g by mouth daily.   ibuprofen (ADVIL,MOTRIN) 800 MG tablet Take 800 mg by mouth every 8 (eight) hours as needed.   tamsulosin (FLOMAX) 0.4 MG CAPS capsule Take 0.8 mg by mouth daily.    COVID-19 mRNA bivalent vaccine, Pfizer, (PFIZER COVID-19 VAC BIVALENT) injection Inject into the muscle. (Patient not taking: Reported on 01/25/2021)   [DISCONTINUED] doxycycline (VIBRAMYCIN) 100 MG capsule Take 1 capsule (100 mg total) by mouth 2 (two) times daily.   [DISCONTINUED] predniSONE (DELTASONE) 10 MG tablet Take 4 tablets (40 mg total) by  mouth daily.   No facility-administered encounter medications on file as of 01/25/2021.    Allergies (verified) Patient has no known allergies.   History: Past Medical History:  Diagnosis Date   BCC (basal cell carcinoma of skin)    dr Allyson Sabal   BPH (benign prostatic hyperplasia)    Decreased hearing    Left, saw audiologist in 2011 per pt    DJD (degenerative joint disease)    Elevated liver function tests    Hyperlipidemia    mixed   Hypertension    Obesity    Ocular rosacea    on doxy as of 01-2013   OSA (obstructive sleep apnea)    on CPAP   Tubular adenoma of colon 2017   Past Surgical History:  Procedure Laterality Date   COLONOSCOPY W/ POLYPECTOMY  10/20/2015   tubular adenoma   TONSILLECTOMY     Family History  Problem Relation Age of Onset   Prostate cancer Father 8       age 82s died at 33, also an UNCLE   Coronary artery disease Paternal Grandmother    Diabetes Mother    Hypertension Mother    Heart failure Maternal Grandmother    Esophageal cancer Paternal Grandfather    Colon cancer Neg Hx    Stroke Neg Hx    Social History   Socioeconomic History   Marital status: Married    Spouse name: Not on file   Number of children: 3   Years of education: Not on file  Highest education level: Professional school degree (e.g., MD, DDS, DVM, JD)  Occupational History   Occupation: dentist  Tobacco Use   Smoking status: Former    Types: Cigarettes    Quit date: 11/21/1968    Years since quitting: 52.2   Smokeless tobacco: Never   Tobacco comments:    quit late 70s  Vaping Use   Vaping Use: Never used  Substance and Sexual Activity   Alcohol use: Yes    Comment: 2 cocktails per week   Drug use: No   Sexual activity: Not on file  Other Topics Concern   Not on file  Social History Narrative   Lives with his wife in a 2 story home with a basement.  Has 3 children.  Works as a Pharmacist, community.  Education: Associate Professor.    Social Determinants of Health    Financial Resource Strain: Low Risk    Difficulty of Paying Living Expenses: Not hard at all  Food Insecurity: No Food Insecurity   Worried About Charity fundraiser in the Last Year: Never true   [place] in the Last Year: Never true  Transportation Needs: No Transportation Needs   Lack of Transportation (Medical): No   Lack of Transportation (Non-Medical): No  Physical Activity: Inactive   Days of Exercise per Week: 0 days   Minutes of Exercise per Session: 0 min  Stress: No Stress Concern Present   Feeling of Stress : Not at all  Social Connections: Moderately Isolated   Frequency of Communication with Friends and Family: More than three times a week   Frequency of Social Gatherings with Friends and Family: More than three times a week   Attends Religious Services: Never   Marine scientist or Organizations: No   Attends Music therapist: Never   Marital Status: Married    Tobacco Counseling Counseling given: Not Answered Tobacco comments: quit late 70s   Clinical Intake:  Pre-visit preparation completed: Yes  Pain : No/denies pain     BMI - recorded: 36.49 Nutritional Status: BMI > 30  Obese Nutritional Risks: None Diabetes: No  How often do you need to have someone help you when you read instructions, pamphlets, or other written materials from your doctor or pharmacy?: 1 - Never  Diabetic?No  Interpreter Needed?: No  Information entered by :: Caroleen Hamman LPN   Activities of Daily Living In your present state of health, do you have any difficulty performing the following activities: 01/25/2021 12/22/2020  Hearing? N N  Vision? N N  Difficulty concentrating or making decisions? N N  Walking or climbing stairs? N N  Dressing or bathing? N N  Doing errands, shopping? N N  Preparing Food and eating ? N -  Using the Toilet? N -  In the past six months, have you accidently leaked urine? N -  Do you have problems with loss of bowel  control? N -  Managing your Medications? N -  Managing your Finances? N -  Housekeeping or managing your Housekeeping? N -  Some recent data might be hidden    Patient Care Team: Colon Branch, MD as PCP - Domenic Schwab, MD as Consulting Physician (Orthopedic Surgery) Ceasar Mons, MD as Consulting Physician (Urology) [name], MD as Attending Physician (Neurology)  Indicate any recent Medical Services you may have received from other than [facility] providers in the past year (date may be approximate).     Assessment:  This is a routine wellness examination for Saralyn Pilar.  Hearing/Vision screen Hearing Screening - Comments:: Hearing loss in left ear Vision Screening - Comments:: Last eye exam-within the past year-Dr. Gloriann Loan with [facility]  Dietary issues and exercise activities discussed: Current Exercise Habits: The patient does not participate in regular exercise at present, Exercise limited by: None identified   Goals Addressed             This Visit's Progress    Patient Stated       Increase activity       Depression Screen PHQ 2/9 Scores 01/25/2021 12/22/2020 06/16/2020 08/06/2019 05/14/2019 09/19/2017 08/23/2016  PHQ - 2 Score 0 0 0 0 0 0 0    Fall Risk Fall Risk  01/25/2021 12/22/2020 06/16/2020 08/06/2019 05/14/2019  Falls in the past year? 0 0 1 0 0  Number falls in past yr: 0 0 1 0 0  Injury with Fall? 0 0 0 0 0  Risk Factor Category  - - - - -  Risk for fall due to : - - - - -  Follow up Falls prevention discussed Falls evaluation completed - Education provided;Falls prevention discussed Falls evaluation completed    FALL RISK PREVENTION PERTAINING TO THE HOME:  Any stairs in or around the home? Yes  If so, are there any without handrails? No  Home free of loose throw rugs in walkways, pet beds, electrical cords, etc? Yes  Adequate lighting in your home to reduce risk of falls? Yes   ASSISTIVE DEVICES UTILIZED TO PREVENT FALLS:  Life alert? No   Use of a cane, walker or w/c? No  Grab bars in the bathroom? No  Shower chair or bench in shower? No  Elevated toilet seat or a handicapped toilet? Yes   TIMED UP AND GO:  Was the test performed? Yes .  Length of time to ambulate 10 feet: 10 sec.   Gait steady and fast without use of assistive device  Cognitive Function:Normal cognitive status assessed by direct observation by this Nurse Health Advisor. No abnormalities found.          Immunizations Immunization History  Administered Date(s) Administered   Fluad Quad(high Dose 65+) 10/09/2018, 11/05/2019, 10/27/2020   Hepatitis A 06/03/2008   Hepatitis A, Adult 08/12/2014   Influenza Split 11/23/2010   Influenza Whole 11/09/2009   Influenza, High Dose Seasonal PF 12/29/2015, 12/27/2016, 12/05/2017   Influenza,inj,Quad PF,6+ Mos 10/22/2012, 01/07/2014, 10/07/2014   PFIZER Comirnaty(Gray Top)Covid-19 Tri-Sucrose Vaccine 04/25/2020   PFIZER(Purple Top)SARS-COV-2 Vaccination 03/07/2019, 03/30/2019, 12/10/2019   Pfizer Covid-19 Vaccine Bivalent Booster 57yrs & up 11/24/2020   Pneumococcal Conjugate-13 08/18/2015   Pneumococcal Polysaccharide-23 08/12/2014, 12/22/2020   Td 06/03/2008   Tdap 10/09/2018   Zoster Recombinat (Shingrix) 06/16/2020, 09/01/2020   Zoster, Live 06/03/2008    TDAP status: Up to date  Flu Vaccine status: Up to date  Pneumococcal vaccine status: Up to date  Covid-19 vaccine status: Completed vaccines  Qualifies for Shingles Vaccine? No   Zostavax completed Yes   Shingrix Completed?: Yes  Screening Tests Health Maintenance  Topic Date Due   COLONOSCOPY (Pts 45-51yrs Insurance coverage will need to be confirmed)  10/19/2025   TETANUS/TDAP  10/08/2028   Pneumonia Vaccine 84+ Years old  Completed   INFLUENZA VACCINE  Completed   COVID-19 Vaccine  Completed   Hepatitis C Screening  Completed   Zoster Vaccines- Shingrix  Completed   HPV VACCINES  Aged Out    Health Maintenance  There are  no preventive care reminders to display for this patient.  Colorectal cancer screening: Type of screening: Colonoscopy. Completed 10/20/2015. Repeat every 10 years  Lung Cancer Screening: (Low Dose CT Chest recommended if Age 7-80 years, 30 pack-year currently smoking OR have quit w/in 15years.) does not qualify.     Additional Screening:  Hepatitis C Screening: Completed 06/09/2009  Vision Screening: Recommended annual ophthalmology exams for early detection of glaucoma and other disorders of the eye. Is the patient up to date with their annual eye exam?  Yes  Who is the provider or what is the name of the office in which the patient attends annual eye exams? Dr. Gloriann Loan   Dental Screening: Recommended annual dental exams for proper oral hygiene  Community Resource Referral / Chronic Care Management: CRR required this visit?  No   CCM required this visit?  No      Plan:     I have personally reviewed and noted the following in the patients chart:   Medical and social history Use of alcohol, tobacco or illicit drugs  Current medications and supplements including opioid prescriptions. Patient is not currently taking opioid prescriptions. Functional ability and status Nutritional status Physical activity Advanced directives List of other physicians Hospitalizations, surgeries, and ER visits in previous 12 months Vitals Screenings to include cognitive, depression, and falls Referrals and appointments  In addition, I have reviewed and discussed with patient certain preventive protocols, quality metrics, and best practice recommendations. A written personalized care plan for preventive services as well as general preventive health recommendations were provided to patient.     Marta Antu, LPN   3/0/0511  Nurse Health Advisor  Nurse Notes: None

## 2021-01-25 NOTE — Patient Instructions (Signed)
Dale [name] , Thank you for taking time to come for your Medicare Wellness Visit. I appreciate your ongoing commitment to your health goals. Please review the following plan we discussed and let me know if I can assist you in the future.   Screening recommendations/referrals: Colonoscopy: Completed 10/20/2015-Due 10/19/2025 Recommended yearly ophthalmology/optometry visit for glaucoma screening and checkup Recommended yearly dental visit for hygiene and checkup  Vaccinations: Influenza vaccine: Up to date Pneumococcal vaccine: Up to date Tdap vaccine: Up to date Shingles vaccine: Completed  vaccines   Covid-19: Up to date  Advanced directives: Information given today.  Conditions/risks identified: See problem  Next appointment: Follow up in one year for your annual wellness visit.   Preventive Care 27 Years and Older, Male Preventive care refers to lifestyle choices and visits with your health care provider that can promote health and wellness. What does preventive care include? A yearly physical exam. This is also called an annual well check. Dental exams once or twice a year. Routine eye exams. Ask your health care provider how often you should have your eyes checked. Personal lifestyle choices, including: Daily care of your teeth and gums. Regular physical activity. Eating a healthy diet. Avoiding tobacco and drug use. Limiting alcohol use. Practicing safe sex. Taking low doses of aspirin every day. Taking vitamin and mineral supplements as recommended by your health care provider. What happens during an annual well check? The services and screenings done by your health care provider during your annual well check will depend on your age, overall health, lifestyle risk factors, and family history of disease. Counseling  Your health care provider may ask you questions about your: Alcohol use. Tobacco use. Drug use. Emotional well-being. Home and relationship  well-being. Sexual activity. Eating habits. History of falls. Memory and ability to understand (cognition). Work and work Statistician. Screening  You may have the following tests or measurements: Height, weight, and BMI. Blood pressure. Lipid and cholesterol levels. These may be checked every 5 years, or more frequently if you are over 36 years old. Skin check. Lung cancer screening. You may have this screening every year starting at age 73 if you have a 30-pack-year history of smoking and currently smoke or have quit within the past 15 years. Fecal occult blood test (FOBT) of the stool. You may have this test every year starting at age 73. Flexible sigmoidoscopy or colonoscopy. You may have a sigmoidoscopy every 5 years or a colonoscopy every 10 years starting at age 73. Prostate cancer screening. Recommendations will vary depending on your family history and other risks. Hepatitis C blood test. Hepatitis B blood test. Sexually transmitted disease (STD) testing. Diabetes screening. This is done by checking your blood sugar (glucose) after you have not eaten for a while (fasting). You may have this done every 1-3 years. Abdominal aortic aneurysm (AAA) screening. You may need this if you are a current or former smoker. Osteoporosis. You may be screened starting at age 73 if you are at high risk. Talk with your health care provider about your test results, treatment options, and if necessary, the need for more tests. Vaccines  Your health care provider may recommend certain vaccines, such as: Influenza vaccine. This is recommended every year. Tetanus, diphtheria, and acellular pertussis (Tdap, Td) vaccine. You may need a Td booster every 10 years. Zoster vaccine. You may need this after age 2. Pneumococcal 13-valent conjugate (PCV13) vaccine. One dose is recommended after age 25. Pneumococcal polysaccharide (PPSV23) vaccine. One dose is recommended  after age 73. Talk to your health care  provider about which screenings and vaccines you need and how often you need them. This information is not intended to replace advice given to you by your health care provider. Make sure you discuss any questions you have with your health care provider. Document Released: 02/03/2015 Document Revised: 09/27/2015 Document Reviewed: 11/08/2014 Elsevier Interactive Patient Education  2017 [place] Prevention in the Home Falls can cause injuries. They can happen to people of all ages. There are many things you can do to make your home safe and to help prevent falls. What can I do on the outside of my home? Regularly fix the edges of walkways and driveways and fix any cracks. Remove anything that might make you trip as you walk through a door, such as a raised step or threshold. Trim any bushes or trees on the path to your home. Use bright outdoor lighting. Clear any walking paths of anything that might make someone trip, such as rocks or tools. Regularly check to see if handrails are loose or broken. Make sure that both sides of any steps have handrails. Any raised decks and porches should have guardrails on the edges. Have any leaves, snow, or ice cleared regularly. Use sand or salt on walking paths during winter. Clean up any spills in your garage right away. This includes oil or grease spills. What can I do in the bathroom? Use night lights. Install grab bars by the toilet and in the tub and shower. Do not use towel bars as grab bars. Use non-skid mats or decals in the tub or shower. If you need to sit down in the shower, use a plastic, non-slip stool. Keep the floor dry. Clean up any water that spills on the floor as soon as it happens. Remove soap buildup in the tub or shower regularly. Attach bath mats securely with double-sided non-slip rug tape. Do not have throw rugs and other things on the floor that can make you trip. What can I do in the bedroom? Use night lights. Make  sure that you have a light by your bed that is easy to reach. Do not use any sheets or blankets that are too big for your bed. They should not hang down onto the floor. Have a firm chair that has side arms. You can use this for support while you get dressed. Do not have throw rugs and other things on the floor that can make you trip. What can I do in the kitchen? Clean up any spills right away. Avoid walking on wet floors. Keep items that you use a lot in easy-to-reach places. If you need to reach something above you, use a strong step stool that has a grab bar. Keep electrical cords out of the way. Do not use floor polish or wax that makes floors slippery. If you must use wax, use non-skid floor wax. Do not have throw rugs and other things on the floor that can make you trip. What can I do with my stairs? Do not leave any items on the stairs. Make sure that there are handrails on both sides of the stairs and use them. Fix handrails that are broken or loose. Make sure that handrails are as long as the stairways. Check any carpeting to make sure that it is firmly attached to the stairs. Fix any carpet that is loose or worn. Avoid having throw rugs at the top or bottom of the stairs. If you  do have throw rugs, attach them to the floor with carpet tape. Make sure that you have a light switch at the top of the stairs and the bottom of the stairs. If you do not have them, ask someone to add them for you. What else can I do to help prevent falls? Wear shoes that: Do not have high heels. Have rubber bottoms. Are comfortable and fit you well. Are closed at the toe. Do not wear sandals. If you use a stepladder: Make sure that it is fully opened. Do not climb a closed stepladder. Make sure that both sides of the stepladder are locked into place. Ask someone to hold it for you, if possible. Clearly mark and make sure that you can see: Any grab bars or handrails. First and last steps. Where the  edge of each step is. Use tools that help you move around (mobility aids) if they are needed. These include: Canes. Walkers. Scooters. Crutches. Turn on the lights when you go into a dark area. Replace any light bulbs as soon as they burn out. Set up your furniture so you have a clear path. Avoid moving your furniture around. If any of your floors are uneven, fix them. If there are any pets around you, be aware of where they are. Review your medicines with your doctor. Some medicines can make you feel dizzy. This can increase your chance of falling. Ask your doctor what other things that you can do to help prevent falls. This information is not intended to replace advice given to you by your health care provider. Make sure you discuss any questions you have with your health care provider. Document Released: 11/03/2008 Document Revised: 06/15/2015 Document Reviewed: 02/11/2014 Elsevier Interactive Patient Education  2017 [employer].

## 2021-02-02 DIAGNOSIS — N401 Enlarged prostate with lower urinary tract symptoms: Secondary | ICD-10-CM | POA: Diagnosis not present

## 2021-02-02 LAB — PSA: PSA: 1.05

## 2021-02-09 DIAGNOSIS — N401 Enlarged prostate with lower urinary tract symptoms: Secondary | ICD-10-CM | POA: Diagnosis not present

## 2021-02-09 DIAGNOSIS — R3912 Poor urinary stream: Secondary | ICD-10-CM | POA: Diagnosis not present

## 2021-02-12 ENCOUNTER — Encounter: Payer: Self-pay | Admitting: Internal Medicine

## 2021-03-22 DIAGNOSIS — L814 Other melanin hyperpigmentation: Secondary | ICD-10-CM | POA: Diagnosis not present

## 2021-03-22 DIAGNOSIS — L578 Other skin changes due to chronic exposure to nonionizing radiation: Secondary | ICD-10-CM | POA: Diagnosis not present

## 2021-03-22 DIAGNOSIS — C44219 Basal cell carcinoma of skin of left ear and external auricular canal: Secondary | ICD-10-CM | POA: Diagnosis not present

## 2021-03-22 DIAGNOSIS — L820 Inflamed seborrheic keratosis: Secondary | ICD-10-CM | POA: Diagnosis not present

## 2021-03-22 DIAGNOSIS — Z87891 Personal history of nicotine dependence: Secondary | ICD-10-CM | POA: Diagnosis not present

## 2021-03-22 DIAGNOSIS — Z85828 Personal history of other malignant neoplasm of skin: Secondary | ICD-10-CM | POA: Diagnosis not present

## 2021-03-22 DIAGNOSIS — L570 Actinic keratosis: Secondary | ICD-10-CM | POA: Diagnosis not present

## 2021-03-22 DIAGNOSIS — D1801 Hemangioma of skin and subcutaneous tissue: Secondary | ICD-10-CM | POA: Diagnosis not present

## 2021-03-24 ENCOUNTER — Other Ambulatory Visit: Payer: Self-pay | Admitting: Internal Medicine

## 2021-03-28 ENCOUNTER — Other Ambulatory Visit (HOSPITAL_BASED_OUTPATIENT_CLINIC_OR_DEPARTMENT_OTHER): Payer: Self-pay

## 2021-03-28 ENCOUNTER — Encounter: Payer: Self-pay | Admitting: Internal Medicine

## 2021-03-28 ENCOUNTER — Ambulatory Visit (INDEPENDENT_AMBULATORY_CARE_PROVIDER_SITE_OTHER): Payer: Medicare Other | Admitting: Internal Medicine

## 2021-03-28 VITALS — BP 136/80 | HR 68 | Temp 98.2°F | Resp 16 | Ht 71.0 in | Wt 265.0 lb

## 2021-03-28 DIAGNOSIS — B349 Viral infection, unspecified: Secondary | ICD-10-CM

## 2021-03-28 DIAGNOSIS — J069 Acute upper respiratory infection, unspecified: Secondary | ICD-10-CM

## 2021-03-28 LAB — POC COVID19 BINAXNOW: SARS Coronavirus 2 Ag: NEGATIVE

## 2021-03-28 LAB — POCT INFLUENZA A/B
Influenza A, POC: NEGATIVE
Influenza B, POC: NEGATIVE

## 2021-03-28 MED ORDER — OSELTAMIVIR PHOSPHATE 75 MG PO CAPS
75.0000 mg | ORAL_CAPSULE | Freq: Two times a day (BID) | ORAL | 0 refills | Status: DC
  Filled 2021-03-28: qty 10, 5d supply, fill #0

## 2021-03-28 NOTE — Patient Instructions (Signed)
Rest and drink plenty of fluids ? ?Start Tamiflu x5 days in case this is influenza ? ?Check a COVID test in 48 hours, let me know if it came back positive. ? ?For cough:  ?Take Mucinex DM or Robitussin-DM OTC.  Follow the instructions in the box. ? ?For nasal congestion: ?Use OTC Astepro 2 nasal sprays on each side of the nose twice daily until better ? ?Avoid decongestants such as  Pseudoephedrine or phenylephrine  ?  ? ?Call if not gradually better over the next  10 days ? ? ?Call anytime if the symptoms are severe  ?

## 2021-03-28 NOTE — Progress Notes (Signed)
? ?Subjective:  ? ? Patient ID: Caryn Bee, DDS, male    DOB: July 12, 1950, 71 y.o.   MRN: [id number] ? ?DOS:  03/28/2021 ?Type of visit - description: acute ? ?Sudden onset of symptoms this morning: ?Postnasal dripping, hacking cough, aches and pains mostly at the upper and lower extremities. ?Has not checked his temperature but he "cannot get warm". ?Sick contacts?  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 ?Today he has not taking any medication to decrease fever or for cough. ? ?Review of Systems ?Minimal sore throat ?No chest pain or difficulty breathing ?No nausea or vomiting ?No rash ?No sputum production ?Minimal headache. ? ?Past Medical History:  ?Diagnosis Date  ? BCC (basal cell carcinoma of skin)   ? dr Allyson Sabal  ? BPH (benign prostatic hyperplasia)   ? Decreased hearing   ? Left, saw audiologist in 2011 per pt   ? DJD (degenerative joint disease)   ? Elevated liver function tests   ? Hyperlipidemia   ? mixed  ? Hypertension   ? Obesity   ? Ocular rosacea   ? on doxy as of 01-2013  ? OSA (obstructive sleep apnea)   ? on CPAP  ? Tubular adenoma of colon 2017  ? ? ?Past Surgical History:  ?Procedure Laterality Date  ? COLONOSCOPY W/ POLYPECTOMY  10/20/2015  ? tubular adenoma  ? TONSILLECTOMY    ? ? ?Current Outpatient Medications  ?Medication Instructions  ? atorvastatin (LIPITOR) 10 MG tablet TAKE ONE TABLET BY MOUTH AT BEDTIME.  ? Cholecalciferol (VITAMIN D-3) 25 MCG (1000 UT) CAPS Oral  ? Cyanocobalamin (B-12 PO) Oral  ? fish oil-omega-3 fatty acids 1 g, Oral, Daily  ? ibuprofen (ADVIL) 800 mg, Every 8 hours PRN  ? tamsulosin (FLOMAX) 0.8 mg, Oral, Daily  ? ? ?   ?Objective:  ? Physical Exam ?BP 136/80 (BP Location: Left Arm, Patient Position: Sitting, Cuff Size: Normal)   Pulse 68   Temp 98.2 ?F (36.8 ?C) (Oral)   Resp 16   Ht '5\' 11"'$  (1.803 m)   Wt 265 lb (120.2 kg)   SpO2 96%   BMI 36.96 kg/m?  ?General:   ?Well developed, NAD, BMI noted. ?HEENT:   ?Normocephalic . Face symmetric, atraumatic. ?Nose: Slightly congested ?Sinuses non-TTP ?Throat: No redness, no white patches. ?TMs: Obscured by wax ?Lungs:  ?CTA B ?Normal respiratory effort, no intercostal retractions, no accessory muscle use. ?Heart: RRR,  no murmur.  ?Lower extremities: no pretibial edema bilaterally  ?Skin: Not pale. Not jaundice ?Neurologic:  ?alert & oriented X3.  ?Speech normal, gait appropriate for age and unassisted ?Psych--  ?Cognition and judgment appear intact.  ?Cooperative with normal attention span and concentration.  ?Behavior appropriate. ?No anxious or depressed appearing.  ? ?   ?Assessment   ? ?  ?Assessment ?Elevated BP ?Hyperlipidemia ?Elevated LFTs ?DJD ?Weaker L leg, saw neuro, NCS 01/2018 chronic L 3 radiculopathy ?Morbid obesity: BMI 36 + OSA ?OSA, + CPAP ?BCC, used to see Dr Allyson Sabal   ?Ocular rosacea ?HOH, L  ?BPH w/LUTS sees urology ?Aortic ultrasound: 05/2020, no AAA ? ?PLAN: ?Viral syndrome: ?The patient is 71 y/o , obese, history of OSA, up-to-date on all his vaccines, presenting with sudden onset of respiratory symptoms. COVID influenza test negative. ?Despite the negative test this could be a early influenza or even COVID infection. ?Plan: ?Rest, fluids, symptomatic management with OTCs. ?Start Tamiflu ?Recheck COVID in 2 days, if test came back +,  switch Tamiflu to another antiviral. ?He verbalized understanding, he knows this could be overkill but I feel this is the best way to proceed at this point. ?  ?This visit occurred during the SARS-CoV-2 public health emergency.  Safety protocols were in place, including screening questions prior to the visit, additional usage of staff PPE, and extensive cleaning of exam room while observing appropriate contact time as indicated for disinfecting solutions.  ? ?

## 2021-03-29 NOTE — Assessment & Plan Note (Signed)
Viral syndrome: ?The patient is 71 y/o , obese, history of OSA, up-to-date on all his vaccines, presenting with sudden onset of respiratory symptoms. COVID influenza test negative. ?Despite the negative test this could be a early influenza or even COVID infection. ?Plan: ?Rest, fluids, symptomatic management with OTCs. ?Start Tamiflu ?Recheck COVID in 2 days, if test came back +, switch Tamiflu to another antiviral. ?He verbalized understanding, he knows this could be overkill but I feel this is the best way to proceed at this point. ?

## 2021-05-11 DIAGNOSIS — C44219 Basal cell carcinoma of skin of left ear and external auricular canal: Secondary | ICD-10-CM | POA: Diagnosis not present

## 2021-05-17 DIAGNOSIS — Z4802 Encounter for removal of sutures: Secondary | ICD-10-CM | POA: Diagnosis not present

## 2021-06-08 DIAGNOSIS — Z483 Aftercare following surgery for neoplasm: Secondary | ICD-10-CM | POA: Diagnosis not present

## 2021-06-08 DIAGNOSIS — C44219 Basal cell carcinoma of skin of left ear and external auricular canal: Secondary | ICD-10-CM | POA: Diagnosis not present

## 2021-06-22 ENCOUNTER — Ambulatory Visit (INDEPENDENT_AMBULATORY_CARE_PROVIDER_SITE_OTHER): Payer: Medicare Other | Admitting: Internal Medicine

## 2021-06-22 ENCOUNTER — Encounter: Payer: Self-pay | Admitting: Internal Medicine

## 2021-06-22 VITALS — BP 132/66 | HR 65 | Temp 97.7°F | Resp 16 | Ht 71.0 in | Wt 260.1 lb

## 2021-06-22 DIAGNOSIS — E785 Hyperlipidemia, unspecified: Secondary | ICD-10-CM

## 2021-06-22 DIAGNOSIS — R030 Elevated blood-pressure reading, without diagnosis of hypertension: Secondary | ICD-10-CM

## 2021-06-22 DIAGNOSIS — R42 Dizziness and giddiness: Secondary | ICD-10-CM | POA: Diagnosis not present

## 2021-06-22 NOTE — Progress Notes (Unsigned)
Subjective:    Patient ID: Dale [name], DDS, male    DOB: Aug 03, 1950, 71 y.o.   MRN: [id number]  DOS:  06/22/2021 Type of visit - description: f/u, here with his wife  Since the last office visit is doing well. C/o  episodes of vertigo, had approximately 4 in the last 2 weeks. Sxs are  immediately after he gets out of bed and last 15 seconds. When I ask, he did mention that when he turns in bed he feels slightly dizzy. Symptoms described as spinning, no associated nausea or vomiting. No recent chest pain or palpitations No recent headache, diplopia, slurred speech or motor deficits  Wt Readings from Last 3 Encounters:  06/22/21 260 lb 2 oz (118 kg)  03/28/21 265 lb (120.2 kg)  01/25/21 261 lb 9.6 oz (118.7 kg)     Review of Systems See above   Past Medical History:  Diagnosis Date   BCC (basal cell carcinoma of skin)    dr Allyson Sabal   BPH (benign prostatic hyperplasia)    Decreased hearing    Left, saw audiologist in 2011 per pt    DJD (degenerative joint disease)    Elevated liver function tests    Hyperlipidemia    mixed   Hypertension    Obesity    Ocular rosacea    on doxy as of 01-2013   OSA (obstructive sleep apnea)    on CPAP   Tubular adenoma of colon 2017    Past Surgical History:  Procedure Laterality Date   COLONOSCOPY W/ POLYPECTOMY  10/20/2015   tubular adenoma   TONSILLECTOMY      Current Outpatient Medications  Medication Instructions   atorvastatin (LIPITOR) 10 MG tablet TAKE ONE TABLET BY MOUTH AT BEDTIME.   Cholecalciferol (VITAMIN D-3) 25 MCG (1000 UT) CAPS Oral   Cyanocobalamin (B-12 PO) Oral   fish oil-omega-3 fatty acids 1 g, Oral, Daily   ibuprofen (ADVIL) 800 mg, Every 8 hours PRN   oseltamivir (TAMIFLU) 75 mg, Oral, 2 times daily   tamsulosin (FLOMAX) 0.8 mg, Oral, Daily       Objective:   Physical Exam BP 132/66   Pulse 65   Temp 97.7 F (36.5 C) (Oral)   Resp 16   Ht '5\' 11"'$  (1.803 m)   Wt 260 lb 2 oz (118 kg)    SpO2 96%   BMI 36.28 kg/m  General:   Well developed, NAD, BMI noted. HEENT:  Normocephalic . Face symmetric, atraumatic. Neck: Normal carotid pulses, no bruit. Lungs:  CTA B Normal respiratory effort, no intercostal retractions, no accessory muscle use. Heart: RRR,  no murmur.  Lower extremities: no pretibial edema bilaterally  Skin: Not pale. Not jaundice Neurologic:  alert & oriented X3.  Speech normal, gait appropriate for age and unassisted.  Romberg absent. EOMI Psych--  Cognition and judgment appear intact.  Cooperative with normal attention span and concentration.  Behavior appropriate. No anxious or depressed appearing.      Assessment      Assessment Elevated BP Hyperlipidemia Elevated LFTs DJD Weaker L leg, saw neuro, NCS 01/2018 chronic L 3 radiculopathy Morbid obesity: BMI 36 + OSA OSA, + CPAP BCC, used to see Dr Franchot Erichsen rosacea HOH, L  BPH w/LUTS sees urology Aortic ultrasound: 05/2020, no AAA  PLAN: Routine follow-up Elevated BP: Recommend ambulatory BPs, goals provided Hyperlipidemia: Continue Lipitor, well-controlled. Vertigo: Symptoms as described above, on clinical grounds they are peripheral in nature.  Rec observation, watch  for increasing/severe symptoms.  Watch for red flags.   Preventive care, COVID booster recommended. RTC 4 to 5 months

## 2021-06-22 NOTE — Patient Instructions (Signed)
Check the  blood pressure regularly BP GOAL is between 110/65 and  135/85. If it is consistently higher or lower, let me know   Please consider a covid booster      GO TO THE FRONT DESK, PLEASE SCHEDULE YOUR APPOINTMENTS Come back for a check up in 4 - 5 months     Vertigo Vertigo is the feeling that you or your surroundings are moving when they are not. This feeling can come and go at any time. Vertigo often goes away on its own. Vertigo can be dangerous if it occurs while you are doing something that could endanger yourself or others, such as driving or operating machinery. Your health care provider will do tests to try to determine the cause of your vertigo. Tests will also help your health care provider decide how best to treat your condition. Follow these instructions at home: Eating and drinking     Dehydration can make vertigo worse. Drink enough fluid to keep your urine pale yellow. Do not drink alcohol. Activity Return to your normal activities as told by your health care provider. Ask your health care provider what activities are safe for you. In the morning, first sit up on the side of the bed. When you feel okay, stand slowly while you hold onto something until you know that your balance is fine. Move slowly. Avoid sudden body or head movements or certain positions, as told by your health care provider. If you have trouble walking or keeping your balance, try using a cane for stability. If you feel dizzy or unstable, sit down right away. Avoid doing any tasks that would cause danger to you or others if vertigo occurs. Avoid bending down if you feel dizzy. Place items in your home so that they are easy for you to reach without bending or leaning over. Do not drive or use machinery if you feel dizzy. General instructions Take over-the-counter and prescription medicines only as told by your health care provider. Keep all follow-up visits. This is important. Contact a  health care provider if: Your medicines do not relieve your vertigo or they make it worse. Your condition gets worse or you develop new symptoms. You have a fever. You develop nausea or vomiting, or if nausea gets worse. Your family or friends notice any behavioral changes. You have numbness or a prickling and tingling sensation in part of your body. Get help right away if you: Are always dizzy or you faint. Develop severe headaches. Develop a stiff neck. Develop sensitivity to light. Have difficulty moving or speaking. Have weakness in your hands, arms, or legs. Have changes in your hearing or vision. These symptoms may represent a serious problem that is an emergency. Do not wait to see if the symptoms will go away. Get medical help right away. Call your local emergency services (911 in the [place] Do not drive yourself to the hospital. Summary Vertigo is the feeling that you or your surroundings are moving when they are not. Your health care provider will do tests to try to determine the cause of your vertigo. Follow instructions for home care. You may be told to avoid certain tasks, positions, or movements. Contact a health care provider if your medicines do not relieve your symptoms, or if you have a fever, nausea, vomiting, or changes in behavior. Get help right away if you have severe headaches or difficulty speaking, or you develop hearing or vision problems. This information is not intended to replace advice given to  you by your health care provider. Make sure you discuss any questions you have with your health care provider. Document Revised: 12/08/2019 Document Reviewed: 12/08/2019 Elsevier Patient Education  [place].

## 2021-06-23 NOTE — Assessment & Plan Note (Signed)
Routine follow-up Elevated BP: Recommend ambulatory BPs, goals provided Hyperlipidemia: Continue Lipitor, well-controlled. Vertigo: Symptoms as described above, on clinical grounds they are peripheral in nature.  Rec observation, watch for increasing/severe symptoms.  Watch for red flags.   Preventive care, COVID booster recommended. RTC 4 to 5 months

## 2021-06-29 DIAGNOSIS — Z1211 Encounter for screening for malignant neoplasm of colon: Secondary | ICD-10-CM | POA: Diagnosis not present

## 2021-06-29 DIAGNOSIS — E7800 Pure hypercholesterolemia, unspecified: Secondary | ICD-10-CM | POA: Diagnosis not present

## 2021-06-29 DIAGNOSIS — Z09 Encounter for follow-up examination after completed treatment for conditions other than malignant neoplasm: Secondary | ICD-10-CM | POA: Diagnosis not present

## 2021-06-29 DIAGNOSIS — Z86010 Personal history of colonic polyps: Secondary | ICD-10-CM | POA: Diagnosis not present

## 2021-06-29 DIAGNOSIS — D125 Benign neoplasm of sigmoid colon: Secondary | ICD-10-CM | POA: Diagnosis not present

## 2021-06-29 DIAGNOSIS — D123 Benign neoplasm of transverse colon: Secondary | ICD-10-CM | POA: Diagnosis not present

## 2021-06-29 LAB — HM COLONOSCOPY

## 2021-07-05 ENCOUNTER — Encounter: Payer: Self-pay | Admitting: Internal Medicine

## 2021-08-24 ENCOUNTER — Other Ambulatory Visit: Payer: Self-pay | Admitting: Internal Medicine

## 2021-10-19 ENCOUNTER — Other Ambulatory Visit (HOSPITAL_BASED_OUTPATIENT_CLINIC_OR_DEPARTMENT_OTHER): Payer: Self-pay

## 2021-10-19 DIAGNOSIS — Z23 Encounter for immunization: Secondary | ICD-10-CM | POA: Diagnosis not present

## 2021-10-19 MED ORDER — FLUAD QUADRIVALENT 0.5 ML IM PRSY
PREFILLED_SYRINGE | INTRAMUSCULAR | 0 refills | Status: DC
  Filled 2021-10-19: qty 0.5, 1d supply, fill #0

## 2021-11-08 ENCOUNTER — Other Ambulatory Visit: Payer: Self-pay

## 2021-11-09 ENCOUNTER — Encounter: Payer: Self-pay | Admitting: Internal Medicine

## 2021-11-09 ENCOUNTER — Other Ambulatory Visit (HOSPITAL_BASED_OUTPATIENT_CLINIC_OR_DEPARTMENT_OTHER): Payer: Self-pay

## 2021-11-09 ENCOUNTER — Ambulatory Visit (INDEPENDENT_AMBULATORY_CARE_PROVIDER_SITE_OTHER): Payer: Medicare Other | Admitting: Internal Medicine

## 2021-11-09 VITALS — BP 122/76 | HR 71 | Temp 98.1°F | Resp 16 | Ht 71.0 in | Wt 259.0 lb

## 2021-11-09 DIAGNOSIS — E785 Hyperlipidemia, unspecified: Secondary | ICD-10-CM

## 2021-11-09 DIAGNOSIS — E519 Thiamine deficiency, unspecified: Secondary | ICD-10-CM

## 2021-11-09 DIAGNOSIS — R634 Abnormal weight loss: Secondary | ICD-10-CM

## 2021-11-09 DIAGNOSIS — R7989 Other specified abnormal findings of blood chemistry: Secondary | ICD-10-CM

## 2021-11-09 DIAGNOSIS — Z23 Encounter for immunization: Secondary | ICD-10-CM | POA: Diagnosis not present

## 2021-11-09 DIAGNOSIS — M65331 Trigger finger, right middle finger: Secondary | ICD-10-CM

## 2021-11-09 DIAGNOSIS — D696 Thrombocytopenia, unspecified: Secondary | ICD-10-CM | POA: Diagnosis not present

## 2021-11-09 LAB — CBC WITH DIFFERENTIAL/PLATELET
Basophils Absolute: 0.1 10*3/uL (ref 0.0–0.1)
Basophils Relative: 1.1 % (ref 0.0–3.0)
Eosinophils Absolute: 0.3 10*3/uL (ref 0.0–0.7)
Eosinophils Relative: 4.7 % (ref 0.0–5.0)
HCT: 44.5 % (ref 39.0–52.0)
Hemoglobin: 14.7 g/dL (ref 13.0–17.0)
Lymphocytes Relative: 25.3 % (ref 12.0–46.0)
Lymphs Abs: 1.4 10*3/uL (ref 0.7–4.0)
MCHC: 33 g/dL (ref 30.0–36.0)
MCV: 88 fl (ref 78.0–100.0)
Monocytes Absolute: 0.5 10*3/uL (ref 0.1–1.0)
Monocytes Relative: 8.9 % (ref 3.0–12.0)
Neutro Abs: 3.4 10*3/uL (ref 1.4–7.7)
Neutrophils Relative %: 60 % (ref 43.0–77.0)
Platelets: 149 10*3/uL — ABNORMAL LOW (ref 150.0–400.0)
RBC: 5.05 Mil/uL (ref 4.22–5.81)
RDW: 13.8 % (ref 11.5–15.5)
WBC: 5.6 10*3/uL (ref 4.0–10.5)

## 2021-11-09 LAB — LIPID PANEL
Cholesterol: 148 mg/dL (ref 0–200)
HDL: 46.8 mg/dL (ref >39.00)
LDL Cholesterol: 87 mg/dL (ref 0–99)
NonHDL: 100.7
Total CHOL/HDL Ratio: 3
Triglycerides: 67 mg/dL (ref 0.0–149.0)
VLDL: 13.4 mg/dL (ref 0.0–40.0)

## 2021-11-09 LAB — COMPREHENSIVE METABOLIC PANEL
ALT: 76 U/L — ABNORMAL HIGH (ref 0–53)
AST: 49 U/L — ABNORMAL HIGH (ref 0–37)
Albumin: 4.5 g/dL (ref 3.5–5.2)
Alkaline Phosphatase: 53 U/L (ref 39–117)
BUN: 13 mg/dL (ref 6–23)
CO2: 27 mEq/L (ref 19–32)
Calcium: 9.4 mg/dL (ref 8.4–10.5)
Chloride: 105 mEq/L (ref 96–112)
Creatinine, Ser: 1.02 mg/dL (ref 0.40–1.50)
GFR: 72.88 mL/min (ref >60.00)
Glucose, Bld: 87 mg/dL (ref 70–99)
Potassium: 5.1 mEq/L (ref 3.5–5.1)
Sodium: 141 mEq/L (ref 135–145)
Total Bilirubin: 1.1 mg/dL (ref 0.2–1.2)
Total Protein: 6.9 g/dL (ref 6.0–8.3)

## 2021-11-09 LAB — TSH: TSH: 2.26 u[IU]/mL (ref 0.35–5.50)

## 2021-11-09 MED ORDER — COMIRNATY 30 MCG/0.3ML IM SUSY
PREFILLED_SYRINGE | INTRAMUSCULAR | 0 refills | Status: DC
  Filled 2021-11-09: qty 0.3, 1d supply, fill #0

## 2021-11-09 NOTE — Progress Notes (Unsigned)
Subjective:    Patient ID: Dale [name], DDS, male    DOB: 1950/07/13, 71 y.o.   MRN: [id number]  DOS:  11/09/2021 Type of visit - description: f/u, here with his wife  Today we review his chronic medical problems and preventive care. In general feels well. For the last 4 to 6 weeks has noted pain at the right middle finger base associated with trigger finger phenomena.  Has also noticed some weight loss, has not changed anything, feels well. No fever chills.  No headache No nausea vomiting no blood in the stools. No polyuria or polydipsia  Wt Readings from Last 3 Encounters:  11/09/21 259 lb (117.5 kg)  06/22/21 260 lb 2 oz (118 kg)  03/28/21 265 lb (120.2 kg)     Review of Systems See above   Past Medical History:  Diagnosis Date   BCC (basal cell carcinoma of skin)    dr Allyson Sabal   BPH (benign prostatic hyperplasia)    Decreased hearing    Left, saw audiologist in 2011 per pt    DJD (degenerative joint disease)    Elevated liver function tests    Hyperlipidemia    mixed   Hypertension    Obesity    Ocular rosacea    on doxy as of 01-2013   OSA (obstructive sleep apnea)    on CPAP   Tubular adenoma of colon 2017    Past Surgical History:  Procedure Laterality Date   COLONOSCOPY W/ POLYPECTOMY  10/20/2015   tubular adenoma   TONSILLECTOMY     Social History   Socioeconomic History   Marital status: Married    Spouse name: Not on file   Number of children: 3   Years of education: Not on file   Highest education level: Professional school degree (e.g., MD, DDS, DVM, JD)  Occupational History   Occupation: dentist  Tobacco Use   Smoking status: Former    Types: Cigarettes    Quit date: 11/21/1968    Years since quitting: 53.0   Smokeless tobacco: Never   Tobacco comments:    quit late 70s  Vaping Use   Vaping Use: Never used  Substance and Sexual Activity   Alcohol use: Yes    Comment: 2 cocktails per week   Drug use: No   Sexual activity:  Not on file  Other Topics Concern   Not on file  Social History Narrative   Lives with his wife in a 2 story home with a basement.  Has 3 children.  Works as a Pharmacist, community.  Education: Associate Professor.    Social Determinants of Health   Financial Resource Strain: Low Risk  (01/25/2021)   Overall Financial Resource Strain (CARDIA)    Difficulty of Paying Living Expenses: Not hard at all  Food Insecurity: No Food Insecurity (01/25/2021)   Hunger Vital Sign    Worried About Running Out of Food in the Last Year: Never true    Ran Out of Food in the Last Year: Never true  Transportation Needs: No Transportation Needs (01/25/2021)   PRAPARE - Hydrologist (Medical): No    Lack of Transportation (Non-Medical): No  Physical Activity: Inactive (01/25/2021)   Exercise Vital Sign    Days of Exercise per Week: 0 days    Minutes of Exercise per Session: 0 min  Stress: No Stress Concern Present (01/25/2021)   [place]    Feeling of  Stress : Not at all  Social Connections: Moderately Isolated (01/25/2021)   Social Connection and Isolation Panel [NHANES]    Frequency of Communication with Friends and Family: More than three times a week    Frequency of Social Gatherings with Friends and Family: More than three times a week    Attends Religious Services: Never    Marine scientist or Organizations: No    Attends Archivist Meetings: Never    Marital Status: Married  Human resources officer Violence: Not At Risk (01/25/2021)   Humiliation, Afraid, Rape, and Kick questionnaire    Fear of Current or Ex-Partner: No    Emotionally Abused: No    Physically Abused: No    Sexually Abused: No     Current Outpatient Medications  Medication Instructions   atorvastatin (LIPITOR) 10 MG tablet TAKE ONE TABLET BY MOUTH AT BEDTIME.   Cholecalciferol (VITAMIN D-3) 25 MCG (1000 UT) CAPS Oral   COVID-19 mRNA vaccine  2023-2024 (COMIRNATY) syringe Intramuscular   Cyanocobalamin (B-12 PO) Oral   fish oil-omega-3 fatty acids 1 g, Oral, Daily   ibuprofen (ADVIL) 800 mg, Every 8 hours PRN   tamsulosin (FLOMAX) 0.8 mg, Oral, Daily       Objective:   Physical Exam BP 122/76   Pulse 71   Temp 98.1 F (36.7 C) (Oral)   Resp 16   Ht '5\' 11"'$  (1.803 m)   Wt 259 lb (117.5 kg)   SpO2 96%   BMI 36.12 kg/m  General:   Well developed, NAD, BMI noted. HEENT:  Normocephalic . Face symmetric, atraumatic Lungs:  CTA B Normal respiratory effort, no intercostal retractions, no accessory muscle use. Heart: RRR,  no murmur.  Lower extremities: no pretibial edema bilaterally Upper extremities: Some bony changes consistent with DJD bilaterally. Very mild trigger phenomenon at the  R hand middle finger. Palpation of the palmar tendon of the R middle finger: Slightly TTP.. Skin: Not pale. Not jaundice Neurologic:  alert & oriented X3.  Speech normal, gait appropriate for age and unassisted Psych--  Cognition and judgment appear intact.  Cooperative with normal attention span and concentration.  Behavior appropriate. No anxious or depressed appearing.      Assessment    Assessment Elevated BP Hyperlipidemia Elevated LFTs DJD Weaker L leg, saw neuro, NCS 01/2018 chronic L 3 radiculopathy Morbid obesity: BMI 36 + OSA OSA, + CPAP BCC, used to see Dr Franchot Erichsen rosacea HOH, L  BPH w/LUTS sees urology Aortic ultrasound: 05/2020, no AAA  PLAN: Elevated BP: BP today is normal Hyperlipidemia: On Lipitor, check FLP Elevated LFTs: Chronic issue, check CMP. Trigger finger: New, R middle finger with pain and trigger finger, we can start with conservative treatment with topical Voltaren.  Will call if needs more aggressive treatment, will refer to Ortho. Morbid obesity: Modest weight loss noted, not doing anything different, he feels well.  We will check a TSH. 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 Last colonoscopy 06/2021 RTC 6 months

## 2021-11-09 NOTE — Patient Instructions (Addendum)
You have trigger finger. Use Voltaren gel 2-3 times a day as needed Take occasional ibuprofen with food If your not gradually better, call for orthopedics referral.   Vaccines I recommend:  Covid booster RSV vaccine  Check the  blood pressure regularly BP GOAL is between 110/65 and  135/85. If it is consistently higher or lower, let me know      GO TO THE LAB : Get the blood work     [place], [place] back for   a checkup in 6 months    Advanced care planning:  Do you have a "Living will", "[place] of attorney" ?   If you already have a living will or healthcare power of attorney, is recommended you bring the copy to be scanned in your chart. The document will be available to all the doctors you see in the system.  If you don't have one, please consider create one.

## 2021-11-10 NOTE — Assessment & Plan Note (Signed)
Elevated BP: BP today is normal Hyperlipidemia: On Lipitor, check FLP Elevated LFTs: Chronic issue, check CMP. Trigger finger: New, R middle finger with pain and trigger finger, we can start with conservative treatment with topical Voltaren.  Will call if needs more aggressive treatment, will refer to Ortho. Morbid obesity: Modest weight loss noted, not doing anything different, he feels well.  We will check a TSH. 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 Last colonoscopy 06/2021 RTC 6 months

## 2021-11-13 LAB — VITAMIN B1: Vitamin B1 (Thiamine): 44 nmol/L — ABNORMAL HIGH (ref 8–30)

## 2021-11-13 NOTE — Addendum Note (Signed)
Addended byDamita Dunnings D on: 11/13/2021 08:09 AM   Modules accepted: Orders

## 2021-11-15 ENCOUNTER — Telehealth (HOSPITAL_BASED_OUTPATIENT_CLINIC_OR_DEPARTMENT_OTHER): Payer: Self-pay

## 2021-11-27 ENCOUNTER — Encounter: Payer: Self-pay | Admitting: Internal Medicine

## 2021-12-19 ENCOUNTER — Telehealth: Payer: Self-pay | Admitting: Internal Medicine

## 2021-12-19 NOTE — Telephone Encounter (Signed)
Patient's wife called to confirm number to imaging dept because she keeps leaving messages and no one is returning her calls and no one ever answers. Mickel Baas said she will keep trying but was advised to call us back if no response from their office.

## 2021-12-21 ENCOUNTER — Ambulatory Visit (HOSPITAL_BASED_OUTPATIENT_CLINIC_OR_DEPARTMENT_OTHER)
Admission: RE | Admit: 2021-12-21 | Discharge: 2021-12-21 | Disposition: A | Discharge status: Home / Self Care | Payer: Medicare Other | Source: Ambulatory Visit | Attending: Internal Medicine | Admitting: Internal Medicine

## 2021-12-21 DIAGNOSIS — K760 Fatty (change of) liver, not elsewhere classified: Secondary | ICD-10-CM | POA: Diagnosis not present

## 2021-12-21 DIAGNOSIS — R7989 Other specified abnormal findings of blood chemistry: Secondary | ICD-10-CM | POA: Diagnosis not present

## 2021-12-27 ENCOUNTER — Ambulatory Visit (INDEPENDENT_AMBULATORY_CARE_PROVIDER_SITE_OTHER): Payer: Medicare Other | Admitting: Neurology

## 2021-12-27 ENCOUNTER — Encounter: Payer: Self-pay | Admitting: Neurology

## 2021-12-27 VITALS — BP 131/61 | HR 64 | Ht 70.0 in | Wt 260.5 lb

## 2021-12-27 DIAGNOSIS — G4733 Obstructive sleep apnea (adult) (pediatric): Secondary | ICD-10-CM

## 2021-12-27 NOTE — Progress Notes (Signed)
Subjective:    Patient ID: Dale [name], DDS is a 71 y.o. male.  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  The patient is unaccompanied today and presents for his yearly check up. I last saw him on 12/21/2020, at which time he was compliant with his CPAP of 11 cm.  He still had some residual snoring but he has had some issues with leaking from the mask and we mutually agreed to continue with his CPAP pressure at 11 cm.  He was doing well.  He was advised to follow-up routinely in 1 year.  Today, 12/27/2021: I reviewed his CPAP compliance data from 11/27/2021 through 12/26/2021, which is a total of 30 days, during which time he used his machine every night with percent use days greater than 4 hours at 100%, indicating superb compliance, average usage of 7 hours and 51 minutes, residual AHI at goal at 0.3/h, leak on the low side with the 95th percentile at 0.2 L/min on a pressure of 11 cm with EPR of 3.  He reports doing well, no issues with the machine, no error message that the motor life has been exceeded, works well.  Still has occasional residual snoring but it does not bother him, more bothersome to his wife.  He is up-to-date with his checkup with primary care, nicked his blood vessel on the dorsum of the left hand with a metal part in a vending machine recently, he reports being up-to-date with his tetanus.  He still works full-time as a Pharmacist, community, has had some issues with understaffing but still makes it work, wife is helping out in the Network engineer with administrative duties.     The patient's allergies, current medications, family history, past medical history, past social  history, past surgical history and problem list were reviewed and updated as appropriate.    Previously:  I saw him on 10/14/2019, at which time he was compliant with his CPAP and doing well.  He did have some residual snoring and we mutually agreed to increase his pressure from 10 cm to 11 cm.  He was advised to follow-up routinely in 1 year.   I reviewed his CPAP compliance data from the past 30 days, between 11/21/2020 through 12/20/2020, during which time he used his machine every night with percent use days greater than 4 hours at 100%, indicating superb compliance, average usage of 8 hours and 27 minutes, residual AHI low at 0.3/h, leak low with a 95th percentile at 1.9 L/min on a pressure of 11 cm with EPR of 3.       I saw him on 10/08/2018, at which time he was fully compliant with his CPAP.  He had some residual snoring and we mutually agreed to increase his pressure from 9 cm to 10 cm at the time.   I reviewed his CPAP compliance data from 09/13/2019 through 10/12/2019, which is a total of 30 days, during which time he used his machine every night with percent use days greater than 4 hours at 100%, indicating superb compliance with an average usage of 8 hours and 5 minutes, residual AHI at goal at 0.3/h, leak on the low side with a 95th percentile at 1.5 L/min on a pressure of 10  cm with EPR of 3.     10/02/2017, at which time he had started using his new CPAP machine.  He was compliant with treatment.  He was advised to follow-up yearly.     I reviewed his CPAP compliance data from 09/07/2018 through 10/06/2018 which is a total of 30 days, during which time he used his machine every night with percent use days greater than 4 hours at 100%, indicating superb compliance with an average usage of 7 hours and 49 minutes, residual AHI 0.5/h, at goal, leak on the low side with a 95th percentile at 2.4 L/min on a pressure of 9 cm with EPR of 3.      I first met him on 11/14/2016 at the request of his  primary care physician, at which time he reported a prior diagnosis of obstructive sleep apnea but needed reevaluation as it had been several years and he had an older CPAP machine. He was advised to proceed with sleep study testing. He had a baseline sleep study, followed by a CPAP titration study. I went over his test results with him in detail today. Baseline sleep study from 01/23/2017 showed a sleep efficiency of 80.1 minutes, sleep latency of 5.5 minutes and REM latency mildly delayed at 147 minutes. He had an increased percentage of slow-wave sleep and a decreased percentage of REM sleep. Total AHI was 12.6 per hour, REM AHI was 78.9 per hour. Average oxygen saturation was 96%, nadir was 83%. He had no significant PLMS. He was advised to proceed with a CPAP titration study. He had this on 03/27/2017. Sleep efficiency was 99.2%, sleep latency 1.5 minutes and REM latency normal at 81 minutes. He had a normal percentage of slow-wave sleep and REM sleep was near normal at 18.4%. He was fitted with a small full face mask and CPAP was titrated from 5 cm to 9 cm. On the final titration pressure his AHI was 0 per hour with brief supine REM sleep achieved an O2 nadir of 91%. Based on his test results I prescribed CPAP therapy for home use with a new machine and new equipment at a pressure of 9 cm.   I reviewed his CPAP compliance data from 09/01/2017 through 09/30/2017 which is a total of 30 days, during which time he used his CPAP 29 days with percent used days greater than 4 hours at 97%, indicating excellent compliance with an average usage of 7 hours and 9 minutes, residual AHI of 0.4 per hour, leak on the low side with the 95th percentile at 5.9 L/m on a pressure of 9 cm with EPR of 3.    11/14/2016: (He) was previously diagnosed with obstructive sleep apnea and placed on CPAP therapy. His sleep study was over 10 years ago, prior study results are not available for my review today. A CPAP download was not  available for my review today as the machine is older. I reviewed your office note from 08/23/2016. His Epworth sleepiness score is 7 out of 24, fatigue score is 10 out of 63. He lives with his wife and son. He has 3 children. He works as a Pharmacist, community. He quit smoking in 1979 and drinks alcohol about 3 times a week and reports drinking caffeine "a lot": multiple cups of coffee per day. His wife reports that he is not necessarily all that well rested, when he first started on CPAP some 13 years ago he had a great response to it, felt a big difference in his  daytime energy. He does report an approximately 30 pound weight gain since his original diagnosis. He has 2 daughters, ages 51 and 7, their 44 year old son lives with them. He had issues with leg cramps for a while. He temporarily stopped his atorvastatin at the time but has been able to restart it. Denies FHx of OSA and has no Sx of RLS. He has no night to night nocturia and denies morning headaches.   His Past Medical History Is Significant For: Past Medical History:  Diagnosis Date   BCC (basal cell carcinoma of skin)    dr Allyson Sabal   BPH (benign prostatic hyperplasia)    Decreased hearing    Left, saw audiologist in 2011 per pt    DJD (degenerative joint disease)    Elevated liver function tests    Hyperlipidemia    mixed   Hypertension    Obesity    Ocular rosacea    on doxy as of 01-2013   OSA (obstructive sleep apnea)    on CPAP   Tubular adenoma of colon 2017    His Past Surgical History Is Significant For: Past Surgical History:  Procedure Laterality Date   COLONOSCOPY W/ POLYPECTOMY  10/20/2015   tubular adenoma   TONSILLECTOMY      His Family History Is Significant For: Family History  Problem Relation Age of Onset   Diabetes Mother    Hypertension Mother    Prostate cancer Father 42       age 83s died at 57, also an UNCLE   Heart failure Maternal Grandmother    Coronary artery disease Paternal Grandmother     Esophageal cancer Paternal Grandfather    Colon cancer Neg Hx    Stroke Neg Hx    Sleep apnea Neg Hx     His Social History Is Significant For: Social History   Socioeconomic History   Marital status: Married    Spouse name: Not on file   Number of children: 3   Years of education: Not on file   Highest education level: Professional school degree (e.g., MD, DDS, DVM, JD)  Occupational History   Occupation: dentist  Tobacco Use   Smoking status: Former    Types: Cigarettes    Quit date: 11/21/1968    Years since quitting: 53.1   Smokeless tobacco: Never   Tobacco comments:    quit late 70s  Vaping Use   Vaping Use: Never used  Substance and Sexual Activity   Alcohol use: Yes    Comment: 2 cocktails per week   Drug use: No   Sexual activity: Not on file  Other Topics Concern   Not on file  Social History Narrative   Lives with his wife in a 2 story home with a basement.  Has 3 children.  Works as a Pharmacist, community.  Education: Associate Professor.    Social Determinants of Health   Financial Resource Strain: Low Risk  (01/25/2021)   Overall Financial Resource Strain (CARDIA)    Difficulty of Paying Living Expenses: Not hard at all  Food Insecurity: No Food Insecurity (01/25/2021)   Hunger Vital Sign    Worried About Running Out of Food in the Last Year: Never true    Ran Out of Food in the Last Year: Never true  Transportation Needs: No Transportation Needs (01/25/2021)   PRAPARE - Hydrologist (Medical): No    Lack of Transportation (Non-Medical): No  Physical Activity: Inactive (01/25/2021)   Exercise Vital  Sign    Days of Exercise per Week: 0 days    Minutes of Exercise per Session: 0 min  Stress: No Stress Concern Present (01/25/2021)   [place]    Feeling of Stress : Not at all  Social Connections: Moderately Isolated (01/25/2021)   Social Connection and Isolation Panel [NHANES]     Frequency of Communication with Friends and Family: More than three times a week    Frequency of Social Gatherings with Friends and Family: More than three times a week    Attends Religious Services: Never    Marine scientist or Organizations: No    Attends Archivist Meetings: Never    Marital Status: Married    His Allergies Are:  No Known Allergies:   His Current Medications Are:  Outpatient Encounter Medications as of 12/27/2021  Medication Sig   atorvastatin (LIPITOR) 10 MG tablet TAKE ONE TABLET BY MOUTH AT BEDTIME.   Cholecalciferol (VITAMIN D-3) 25 MCG (1000 UT) CAPS Take by mouth.   COVID-19 mRNA vaccine 2023-2024 (COMIRNATY) syringe Inject into the muscle.   Cyanocobalamin (B-12 PO) Take by mouth.   fish oil-omega-3 fatty acids 1000 MG capsule Take 1 g by mouth daily.   ibuprofen (ADVIL,MOTRIN) 800 MG tablet Take 800 mg by mouth every 8 (eight) hours as needed.   tamsulosin (FLOMAX) 0.4 MG CAPS capsule Take 0.8 mg by mouth daily.    No facility-administered encounter medications on file as of 12/27/2021.  :  Review of Systems:  Out of a complete 14 point review of systems, all are reviewed and negative with the exception of these symptoms as listed below:  Review of Systems  Neurological:        Pt here for CPAP f/u Pt states no questions or concerns for this visit    ESS:4     Objective:  Neurological Exam  Physical Exam Physical Examination:   Vitals:   12/27/21 1402  BP: 131/61  Pulse: 64    General Examination: The patient is a very pleasant 71 y.o. male in no acute distress. He appears well-developed and well-nourished and well groomed.   HEENT: Normocephalic, atraumatic, pupils are equal, round and reactive, tracking well-preserved, hearing grossly intact.  Face is symmetric with normal facial animation, speech is clear without dysarthria, hypophonia or voice tremor.  No carotid bruits.    Chest: Clear to auscultation without wheezing,  rhonchi or crackles noted.   Heart: S1+S2+0, regular and normal without murmurs, rubs or gallops noted.    Abdomen: Soft, non-tender and non-distended.   Extremities: There is no obvious edema in the distal lower extremities bilaterally.  Wound dorsum left hand with bandage.   Skin: Warm and dry without trophic changes noted. Rosacea around nose, stable.    Musculoskeletal: exam reveals no obvious joint deformities.    Neurologically:  Mental status: The patient is awake, alert and oriented in all 4 spheres. His immediate and remote memory, attention, language skills and fund of knowledge are appropriate. There is no evidence of aphasia, agnosia, apraxia or anomia. Speech is clear with normal prosody and enunciation. Thought process is linear. Mood is normal and affect is normal.  Cranial nerves II - XII are as described above under HEENT exam.  Motor exam: Normal bulk, strength and tone is noted. There is no obvious tremor. Fine motor skills and coordination: grossly intact.  Cerebellar testing: No dysmetria or intention tremor. There is no truncal or  gait ataxia.  Sensory exam: intact to light touch in the upper and lower extremities.  Gait, station and balance: He stands easily. No veering to one side is noted. No leaning to one side is noted. Posture is age-appropriate and stance is narrow based. Gait shows normal stride length and normal pace. No problems turning are noted.         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  He is doing well, he has had ongoing benefit from treatment, he is fully compliant with it.  He is commended for his treatment adherence  and can follow-up routinely in 1 year.  He is typically up-to-date with his supplies.  He does not have any problem with his machine, we may consider a home sleep test next year for reassessment and issuing a new machine but for now he is agreeable to maintaining treatment on this machine.  He has not seen an error message on it.  I answered all his questions today and he was in agreement with our plan. I spent 30 minutes in total face-to-face time and in reviewing records during pre-charting, more than 50% of which was spent in counseling and coordination of care, reviewing test results, reviewing medications and treatment regimen and/or in discussing or reviewing the diagnosis of OSA, the prognosis and treatment options. Pertinent laboratory and imaging test results that were available during this visit with the patient were reviewed by me and considered in my medical decision making (see chart for details).

## 2021-12-27 NOTE — Patient Instructions (Signed)
Please continue using your CPAP regularly. While your insurance requires that you use CPAP at least 4 hours each night on 70% of the nights, I recommend, that you not skip any nights and use it throughout the night if you can. Getting used to CPAP and staying with the treatment long term does take time and patience and discipline. Untreated obstructive sleep apnea when it is moderate to severe can have an adverse impact on cardiovascular health and raise her risk for heart disease, arrhythmias, hypertension, congestive heart failure, stroke and diabetes. Untreated obstructive sleep apnea causes sleep disruption, nonrestorative sleep, and sleep deprivation. This can have an impact on your day to day functioning and cause daytime sleepiness and impairment of cognitive function, memory loss, mood disturbance, and problems focussing. Using CPAP regularly can improve these symptoms. We can see you in 1 year. We will consider a home sleep test next year to qualify you for a new machine.

## 2022-01-03 ENCOUNTER — Telehealth: Payer: Self-pay | Admitting: Internal Medicine

## 2022-01-03 DIAGNOSIS — N289 Disorder of kidney and ureter, unspecified: Secondary | ICD-10-CM

## 2022-01-03 DIAGNOSIS — N2889 Other specified disorders of kidney and ureter: Secondary | ICD-10-CM

## 2022-01-03 NOTE — Telephone Encounter (Signed)
Damita Dunnings, CMA 12/26/2021 11:40 AM EST     LMOM asking for call back.   Damita Dunnings, [facility] 12/25/2021  7:47 AM EST     Results mailed.   Kennyth Arnold, FNP 12/23/2021  2:34 PM EST     Ultrasound shows fatty liver disease. This happens when their is an excess of fatty tissue surrounding the abdomen. Weight reduction is encouraged.   Incidentally, their is a complex cyst in the right kidney, midpole. CT scan is recommended for further evaluation. Are you ok with additional work-up of this cyst by CT scan?

## 2022-01-03 NOTE — Telephone Encounter (Signed)
Advise patient: Ultrasound showed incidentally a complex cystic lesion right kidney midpole. Recommend a renal protocol CT

## 2022-01-03 NOTE — Telephone Encounter (Signed)
Results mailed again.

## 2022-01-04 NOTE — Telephone Encounter (Signed)
PCP and Pt discussed during Pt's wife CPX appt today. Okay to proceed w/ CT.  Order placed.

## 2022-01-04 NOTE — Addendum Note (Signed)
Addended by: Damita Dunnings D on: 01/04/2022 11:32 AM   Modules accepted: Orders

## 2022-01-11 ENCOUNTER — Telehealth (HOSPITAL_BASED_OUTPATIENT_CLINIC_OR_DEPARTMENT_OTHER): Payer: Self-pay

## 2022-01-18 ENCOUNTER — Ambulatory Visit (HOSPITAL_BASED_OUTPATIENT_CLINIC_OR_DEPARTMENT_OTHER)
Admission: RE | Admit: 2022-01-18 | Discharge: 2022-01-18 | Disposition: A | Discharge status: Home / Self Care | Payer: Medicare Other | Source: Ambulatory Visit | Attending: Internal Medicine | Admitting: Internal Medicine

## 2022-01-18 DIAGNOSIS — K4090 Unilateral inguinal hernia, without obstruction or gangrene, not specified as recurrent: Secondary | ICD-10-CM | POA: Diagnosis not present

## 2022-01-18 DIAGNOSIS — N289 Disorder of kidney and ureter, unspecified: Secondary | ICD-10-CM | POA: Insufficient documentation

## 2022-01-18 DIAGNOSIS — N281 Cyst of kidney, acquired: Secondary | ICD-10-CM | POA: Diagnosis not present

## 2022-01-18 MED ORDER — IOHEXOL 300 MG/ML  SOLN
100.0000 mL | Freq: Once | INTRAMUSCULAR | Status: AC | PRN
  Administered 2022-01-18: 100 mL via INTRAVENOUS

## 2022-01-22 ENCOUNTER — Encounter: Payer: Self-pay | Admitting: Neurology

## 2022-01-23 LAB — POCT I-STAT CREATININE: Creatinine, Ser: 1 mg/dL (ref 0.61–1.24)

## 2022-02-01 IMAGING — CT CT CARDIAC CORONARY ARTERY CALCIUM SCORE
3 series · 14 of 20 positions shown, 15 images · non-contrast
Comparison: Chest radiograph 03/27/2018
COMPARISON: Chest radiograph 03/27/2018

Addendum:
EXAM:
OVER-READ INTERPRETATION  CT CHEST

The following report is an over-read performed by radiologist Dr.
Enedina Bosse [REDACTED] on 06/30/2020. This over-read
does not include interpretation of cardiac or coronary anatomy or
pathology. The calcium score interpretation by the cardiologist is
attached.
CLINICAL DATA: Cardiovascular disease risk stratification
CT Coronary Calcium Score
TECHNIQUE: A gated, non-contrast computed tomography scan of the heart was
performed using 3mm slice thickness. Axial images were analyzed on a
dedicated workstation. Calcium scoring of the coronary arteries was
performed using the Agatston method.

[Series 2: casc 3.0 bv41 2 bestdiast 73 % · axial · 0.46mm/px · z∈[-230,-150]mm · 4 of 45 slices shown, 5 images]
[im 9/45  vessel]
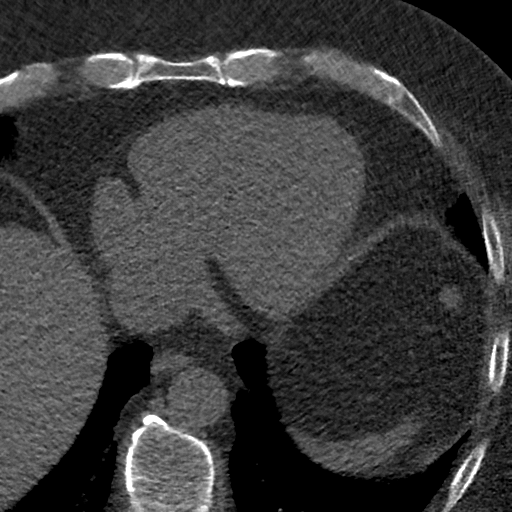
[im 9/45  lung]
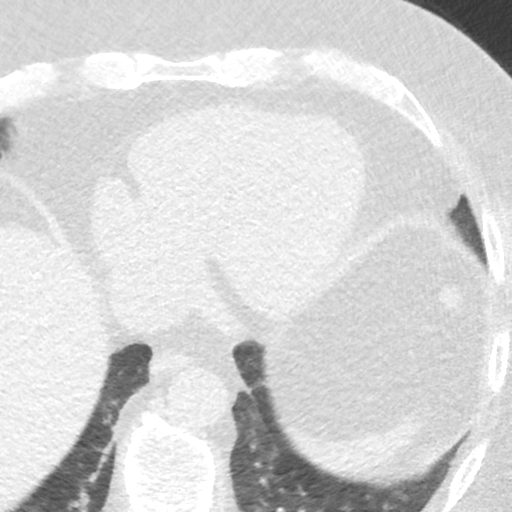
[im 18/45  vessel]
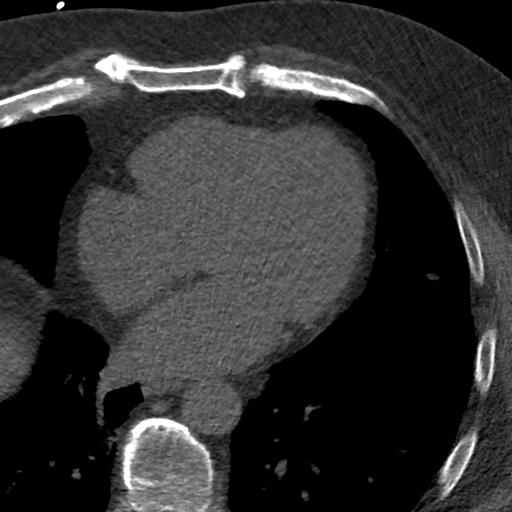
[im 27/45  vessel]
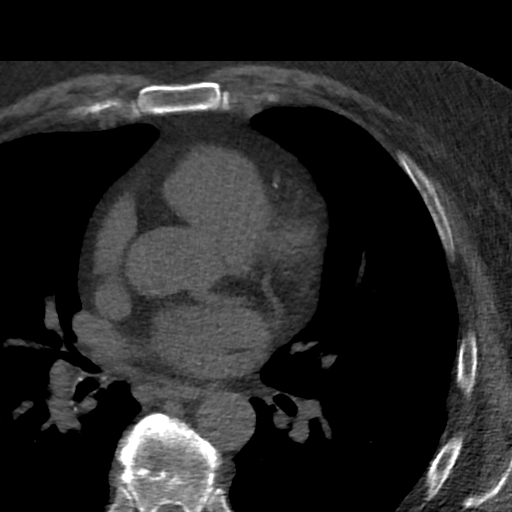
[im 36/45  vessel]
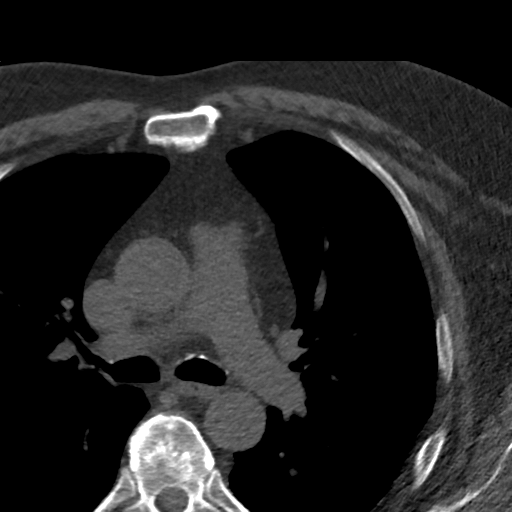

[Series 3: lung 73 % · axial · 0.72mm/px · z∈[-234,-146]mm · 5 of 45 slices shown]
[im 8/45  lung]
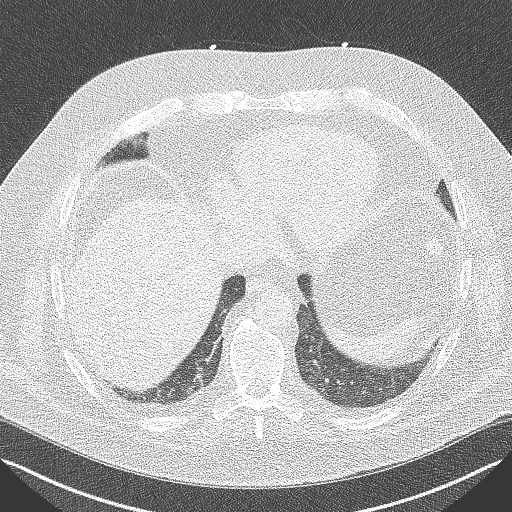
[im 15/45  lung]
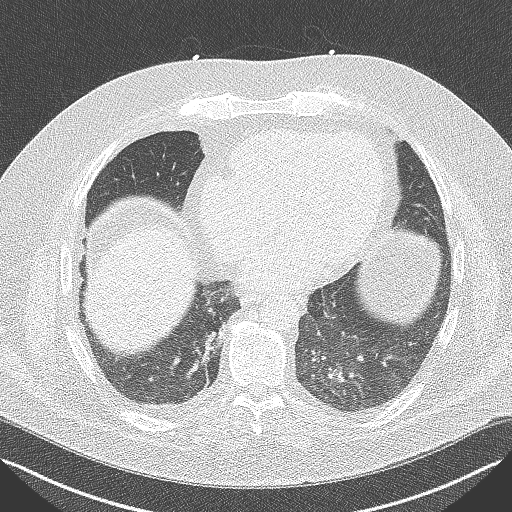
[im 23/45  lung]
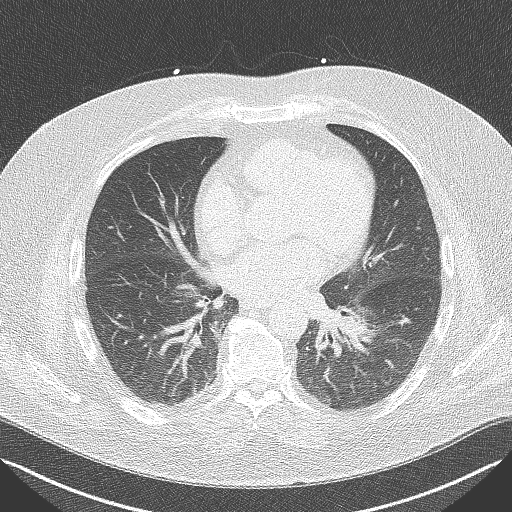
[im 30/45  lung]
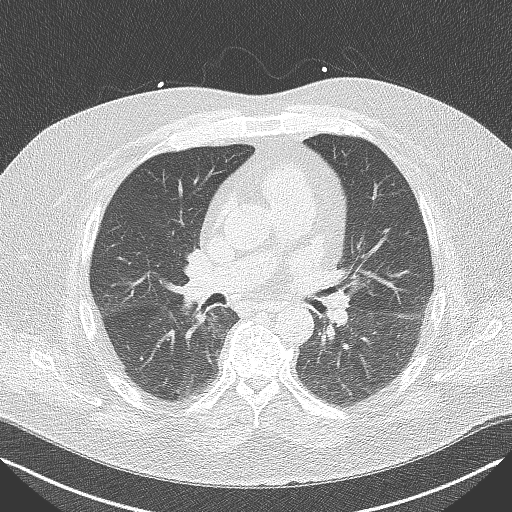
[im 37/45  lung]
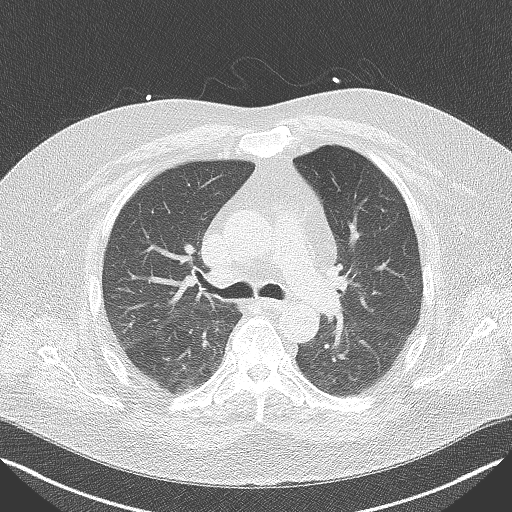

[Series 4: lung st 73 % · axial · 0.72mm/px · z∈[-234,-146]mm · 5 of 45 slices shown]
[im 8/45  lung]
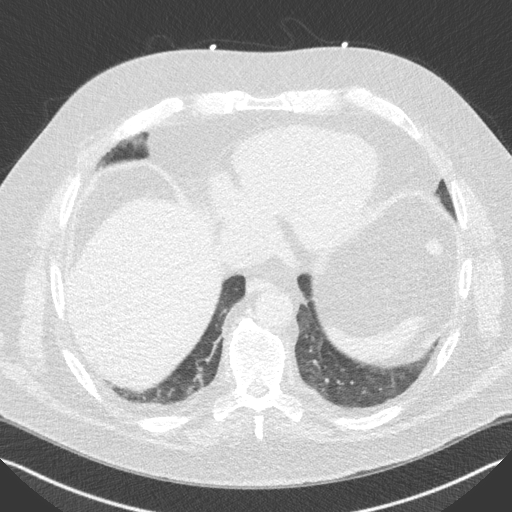
[im 15/45  lung]
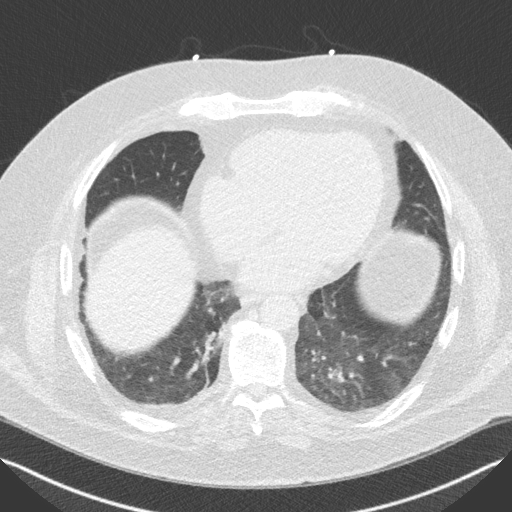
[im 23/45  lung]
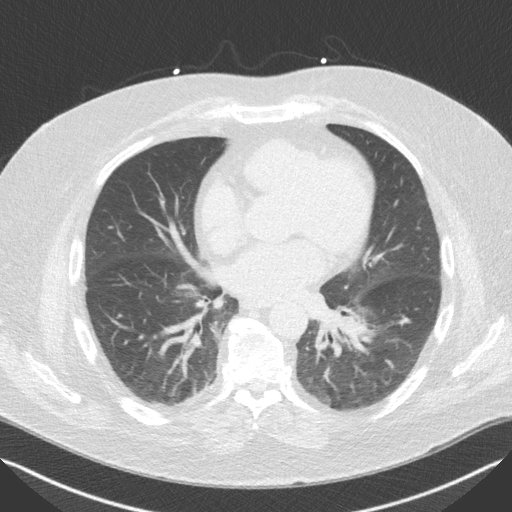
[im 30/45  lung]
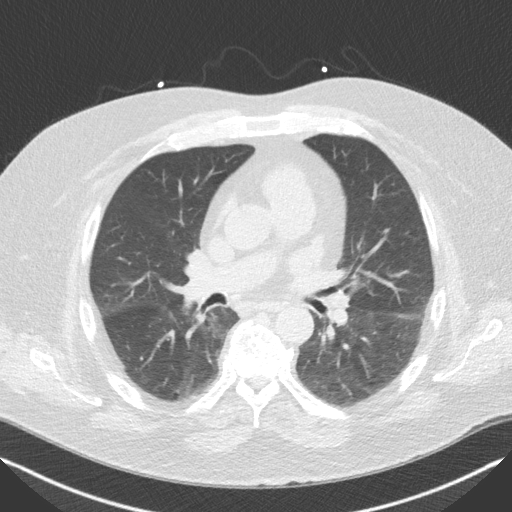
[im 37/45  lung]
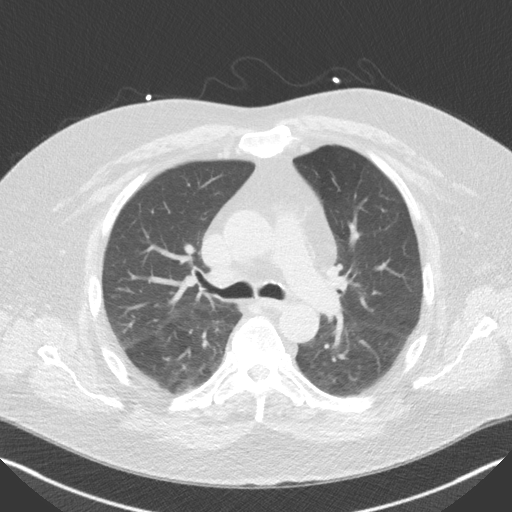

[14 of 20 positions shown; findings below may reference images not displayed]

FINDINGS: Vascular: Aortic atherosclerosis.

Mediastinum/Nodes: No imaged thoracic adenopathy.

Lungs/Pleura: No pleural fluid. Lingular and right lower lobe
scarring.

Upper Abdomen: Normal imaged portions of the liver, spleen, stomach.

Musculoskeletal: No acute osseous abnormality. Moderate thoracic
spondylosis.
IMPRESSION: 1. No acute process in the imaged extracardiac chest.
2. Aortic Atherosclerosis (4EZDJ-FML.L).
FINDINGS: Coronary arteries: Normal origins.

Coronary Calcium Score:

Left main: 0

Left anterior descending artery: 1

Left circumflex artery: 0

Right coronary artery: 0

Total: 1

Percentile: 13th

Pericardium: Normal.

Ascending Aorta: Normal caliber. Ascending aorta measures
approximately 33mm at the mid ascending aorta measured in an axial
plane.

Non-cardiac: See separate report from [REDACTED].
IMPRESSION: Coronary calcium score of 1. This was 13th percentile for age-,
race-, and sex-matched controls.



If CAC=0, it is reasonable to withhold statin therapy and reassess
in 5 to 10 years, as long as higher risk conditions are absent
(diabetes mellitus, family history of premature CHD in first degree
relatives (males <55 years; females <65 years), cigarette smoking,
or LDL >=190 mg/dL).

If CAC is 1 to 99, it is reasonable to initiate statin therapy for
patients >=55 years of age.

If CAC is >=100 or >=75th percentile, it is reasonable to initiate
statin therapy at any age.

Cardiology referral should be considered for patients with CAC
scores >=400 or >=75th percentile.

*1949 AHA/ACC/AACVPR/AAPA/ABC/OLSSON/SHANIQUA/FEVANG/Paulus N/AUJLA/REPETTO/DARYONO
Guideline on the Management of Blood Cholesterol: A Report of the
American College of Cardiology/American Heart Association Task Force
on Clinical Practice Guidelines. J Am Coll Cardiol.
8650;73(24):3126-3301.

*** End of Addendum ***
EXAM:
OVER-READ INTERPRETATION  CT CHEST

The following report is an over-read performed by radiologist Dr.
Enedina Bosse [REDACTED] on 06/30/2020. This over-read
does not include interpretation of cardiac or coronary anatomy or
pathology. The calcium score interpretation by the cardiologist is
attached.
FINDINGS: Vascular: Aortic atherosclerosis.

Mediastinum/Nodes: No imaged thoracic adenopathy.

Lungs/Pleura: No pleural fluid. Lingular and right lower lobe
scarring.

Upper Abdomen: Normal imaged portions of the liver, spleen, stomach.

Musculoskeletal: No acute osseous abnormality. Moderate thoracic
spondylosis.
IMPRESSION: 1. No acute process in the imaged extracardiac chest.
2. Aortic Atherosclerosis (4EZDJ-FML.L).

## 2022-02-08 NOTE — Progress Notes (Signed)
HPI: Follow-up hypertension.  Abdominal ultrasound May 2022 showed no abdominal aortic aneurysm.  Calcium score June 2022 1 which was 13th percentile.  Since last seen the patient denies any dyspnea on exertion, orthopnea, PND, pedal edema, palpitations, syncope or chest pain.   Current Outpatient Medications  Medication Sig Dispense Refill   atorvastatin (LIPITOR) 10 MG tablet TAKE ONE TABLET BY MOUTH AT BEDTIME. 90 tablet 1   Cholecalciferol (VITAMIN D-3) 25 MCG (1000 UT) CAPS Take by mouth.     COVID-19 mRNA vaccine 2023-2024 (COMIRNATY) syringe Inject into the muscle. 0.3 mL 0   Cyanocobalamin (B-12 PO) Take by mouth.     fish oil-omega-3 fatty acids 1000 MG capsule Take 1 g by mouth daily.     ibuprofen (ADVIL,MOTRIN) 800 MG tablet Take 800 mg by mouth every 8 (eight) hours as needed.     tamsulosin (FLOMAX) 0.4 MG CAPS capsule Take 0.8 mg by mouth daily.      No current facility-administered medications for this visit.     Past Medical History:  Diagnosis Date   BCC (basal cell carcinoma of skin)    dr Allyson Sabal   BPH (benign prostatic hyperplasia)    Decreased hearing    Left, saw audiologist in 2011 per pt    DJD (degenerative joint disease)    Elevated liver function tests    Hyperlipidemia    mixed   Hypertension    Obesity    Ocular rosacea    on doxy as of 01-2013   OSA (obstructive sleep apnea)    on CPAP   Tubular adenoma of colon 2017    Past Surgical History:  Procedure Laterality Date   COLONOSCOPY W/ POLYPECTOMY  10/20/2015   tubular adenoma   TONSILLECTOMY      Social History   Socioeconomic History   Marital status: Married    Spouse name: Not on file   Number of children: 3   Years of education: Not on file   Highest education level: Professional school degree (e.g., MD, DDS, DVM, JD)  Occupational History   Occupation: dentist  Tobacco Use   Smoking status: Former    Types: Cigarettes    Quit date: 11/21/1968    Years since quitting:  53.2   Smokeless tobacco: Never   Tobacco comments:    quit late 70s  Vaping Use   Vaping Use: Never used  Substance and Sexual Activity   Alcohol use: Yes    Comment: 2 cocktails per week   Drug use: No   Sexual activity: Not on file  Other Topics Concern   Not on file  Social History Narrative   Lives with his wife in a 2 story home with a basement.  Has 3 children.  Works as a Pharmacist, community.  Education: Associate Professor.    Social Determinants of Health   Financial Resource Strain: Low Risk  (01/25/2021)   Overall Financial Resource Strain (CARDIA)    Difficulty of Paying Living Expenses: Not hard at all  Food Insecurity: No Food Insecurity (01/25/2021)   Hunger Vital Sign    Worried About Running Out of Food in the Last Year: Never true    Ran Out of Food in the Last Year: Never true  Transportation Needs: No Transportation Needs (01/25/2021)   PRAPARE - Hydrologist (Medical): No    Lack of Transportation (Non-Medical): No  Physical Activity: Inactive (01/25/2021)   Exercise Vital Sign    Days  of Exercise per Week: 0 days    Minutes of Exercise per Session: 0 min  Stress: No Stress Concern Present (01/25/2021)   [place]    Feeling of Stress : Not at all  Social Connections: Moderately Isolated (01/25/2021)   Social Connection and Isolation Panel [NHANES]    Frequency of Communication with Friends and Family: More than three times a week    Frequency of Social Gatherings with Friends and Family: More than three times a week    Attends Religious Services: Never    Marine scientist or Organizations: No    Attends Archivist Meetings: Never    Marital Status: Married  Human resources officer Violence: Not At Risk (01/25/2021)   Humiliation, Afraid, Rape, and Kick questionnaire    Fear of Current or Ex-Partner: No    Emotionally Abused: No    Physically Abused: No    Sexually Abused: No     Family History  Problem Relation Age of Onset   Diabetes Mother    Hypertension Mother    Prostate cancer Father 32       age 73s died at 73, also an UNCLE   Heart failure Maternal Grandmother    Coronary artery disease Paternal Grandmother    Esophageal cancer Paternal Grandfather    Colon cancer Neg Hx    Stroke Neg Hx    Sleep apnea Neg Hx     ROS: no fevers or chills, productive cough, hemoptysis, dysphasia, odynophagia, melena, hematochezia, dysuria, hematuria, rash, seizure activity, orthopnea, PND, pedal edema, claudication. Remaining systems are negative.  Physical Exam: Well-developed well-nourished in no acute distress.  Skin is warm and dry.  HEENT is normal.  Neck is supple.  Chest is clear to auscultation with normal expansion.  Cardiovascular exam is regular rate and rhythm.  Abdominal exam nontender or distended. No masses palpated. Extremities show no edema. neuro grossly intact  ECG-sinus bradycardia at a rate of 59, no ST changes.  Personally reviewed  A/P  1 coronary calcification-minimal on last calcium score. Continue statin.  2 hyperlipidemia-continue statin.  Kirk Ruths, MD

## 2022-02-15 DIAGNOSIS — R3912 Poor urinary stream: Secondary | ICD-10-CM | POA: Diagnosis not present

## 2022-02-15 DIAGNOSIS — N401 Enlarged prostate with lower urinary tract symptoms: Secondary | ICD-10-CM | POA: Diagnosis not present

## 2022-02-22 ENCOUNTER — Encounter: Payer: Self-pay | Admitting: Cardiology

## 2022-02-22 ENCOUNTER — Ambulatory Visit: Payer: Medicare Other | Attending: Cardiology | Admitting: Cardiology

## 2022-02-22 VITALS — BP 134/72 | HR 59 | Ht 70.5 in | Wt 259.2 lb

## 2022-02-22 DIAGNOSIS — I2510 Atherosclerotic heart disease of native coronary artery without angina pectoris: Secondary | ICD-10-CM

## 2022-02-22 DIAGNOSIS — E785 Hyperlipidemia, unspecified: Secondary | ICD-10-CM

## 2022-02-22 NOTE — Patient Instructions (Signed)
  Follow-Up: At [facility], you and your health needs are our priority.  As part of our continuing mission to provide you with exceptional heart care, we have created designated Provider Care Teams.  These Care Teams include your primary Cardiologist (physician) and Advanced Practice Providers (APPs -  Physician Assistants and Nurse Practitioners) who all work together to provide you with the care you need, when you need it.  We recommend signing up for the patient portal called "MyChart".  Sign up information is provided on this After Visit Summary.  MyChart is used to connect with patients for Virtual Visits (Telemedicine).  Patients are able to view lab/test results, encounter notes, upcoming appointments, etc.  Non-urgent messages can be sent to your provider as well.   To learn more about what you can do with MyChart, go to [email or web address].    Your next appointment:   12 month(s)  Provider:   Brian Crenshaw MD    

## 2022-03-19 ENCOUNTER — Other Ambulatory Visit: Payer: Self-pay | Admitting: Internal Medicine

## 2022-04-11 DIAGNOSIS — L821 Other seborrheic keratosis: Secondary | ICD-10-CM | POA: Diagnosis not present

## 2022-04-11 DIAGNOSIS — Z85828 Personal history of other malignant neoplasm of skin: Secondary | ICD-10-CM | POA: Diagnosis not present

## 2022-04-11 DIAGNOSIS — L570 Actinic keratosis: Secondary | ICD-10-CM | POA: Diagnosis not present

## 2022-05-24 ENCOUNTER — Encounter: Payer: Self-pay | Admitting: Internal Medicine

## 2022-05-24 ENCOUNTER — Ambulatory Visit (INDEPENDENT_AMBULATORY_CARE_PROVIDER_SITE_OTHER): Payer: Medicare Other | Admitting: Internal Medicine

## 2022-05-24 VITALS — BP 126/68 | HR 58 | Temp 97.6°F | Resp 18 | Ht 70.5 in | Wt 255.4 lb

## 2022-05-24 DIAGNOSIS — R399 Unspecified symptoms and signs involving the genitourinary system: Secondary | ICD-10-CM | POA: Diagnosis not present

## 2022-05-24 DIAGNOSIS — M25552 Pain in left hip: Secondary | ICD-10-CM

## 2022-05-24 DIAGNOSIS — E785 Hyperlipidemia, unspecified: Secondary | ICD-10-CM | POA: Diagnosis not present

## 2022-05-24 DIAGNOSIS — M159 Polyosteoarthritis, unspecified: Secondary | ICD-10-CM | POA: Diagnosis not present

## 2022-05-24 LAB — CBC WITH DIFFERENTIAL/PLATELET
Basophils Absolute: 0 10*3/uL (ref 0.0–0.1)
Basophils Relative: 0.7 % (ref 0.0–3.0)
Eosinophils Absolute: 0.4 10*3/uL (ref 0.0–0.7)
Eosinophils Relative: 7.2 % — ABNORMAL HIGH (ref 0.0–5.0)
HCT: 40.2 % (ref 39.0–52.0)
Hemoglobin: 13.5 g/dL (ref 13.0–17.0)
Lymphocytes Relative: 26.5 % (ref 12.0–46.0)
Lymphs Abs: 1.5 10*3/uL (ref 0.7–4.0)
MCHC: 33.4 g/dL (ref 30.0–36.0)
MCV: 85.4 fl (ref 78.0–100.0)
Monocytes Absolute: 0.5 10*3/uL (ref 0.1–1.0)
Monocytes Relative: 9 % (ref 3.0–12.0)
Neutro Abs: 3.2 10*3/uL (ref 1.4–7.7)
Neutrophils Relative %: 56.6 % (ref 43.0–77.0)
Platelets: 164 10*3/uL (ref 150.0–400.0)
RBC: 4.71 Mil/uL (ref 4.22–5.81)
RDW: 13.2 % (ref 11.5–15.5)
WBC: 5.7 10*3/uL (ref 4.0–10.5)

## 2022-05-24 LAB — URINALYSIS, ROUTINE W REFLEX MICROSCOPIC
Bilirubin Urine: NEGATIVE
Hgb urine dipstick: NEGATIVE
Ketones, ur: NEGATIVE
Leukocytes,Ua: NEGATIVE
Nitrite: NEGATIVE
Specific Gravity, Urine: 1.02 (ref 1.000–1.030)
Total Protein, Urine: NEGATIVE
Urine Glucose: NEGATIVE
Urobilinogen, UA: 0.2 (ref 0.0–1.0)
pH: 6 (ref 5.0–8.0)

## 2022-05-24 LAB — BASIC METABOLIC PANEL
BUN: 12 mg/dL (ref 6–23)
CO2: 29 mEq/L (ref 19–32)
Calcium: 9.1 mg/dL (ref 8.4–10.5)
Chloride: 106 mEq/L (ref 96–112)
Creatinine, Ser: 0.91 mg/dL (ref 0.40–1.50)
GFR: 83.27 mL/min (ref >60.00)
Glucose, Bld: 81 mg/dL (ref 70–99)
Potassium: 4.3 mEq/L (ref 3.5–5.1)
Sodium: 142 mEq/L (ref 135–145)

## 2022-05-24 NOTE — Progress Notes (Signed)
Subjective:    Patient ID: Dale [name], DDS, male    DOB: [birth year], 72 y.o.   MRN: [id number]  DOS:  05/24/2022 Type of visit - description: f/u, here with his wife  Last month he had a 2-week episode of urinary frequency.  It self resolved.  At the time did not have dysuria or gross hematuria.  He has chronic difficulty urinating which was not worse.  He is back to baseline.  Has aches and pains particular in the left hip, is worse when he stays still.  No worse with walking. Pain is at the front of the hip and radiate posteriorly. Paresthesias?  Unclear, sometimes his feet get slightly numb.  No clear-cut radiculopathy symptoms. Has taken ibuprofen at x 800 mg 3 times daily. Denies nausea vomiting.  No blood in the stools.  No change in the color of the stools.   Review of Systems See above   Past Medical History:  Diagnosis Date   BCC (basal cell carcinoma of skin)    dr Terri Piedra   BPH (benign prostatic hyperplasia)    Decreased hearing    Left, saw audiologist in 2011 per pt    DJD (degenerative joint disease)    Elevated liver function tests    Hyperlipidemia    mixed   Hypertension    Obesity    Ocular rosacea    on doxy as of 01-2013   OSA (obstructive sleep apnea)    on CPAP   Tubular adenoma of colon 2017    Past Surgical History:  Procedure Laterality Date   COLONOSCOPY W/ POLYPECTOMY  10/20/2015   tubular adenoma   TONSILLECTOMY      Current Outpatient Medications  Medication Instructions   atorvastatin (LIPITOR) 10 MG tablet TAKE ONE TABLET BY MOUTH AT BEDTIME.   Cholecalciferol (VITAMIN D-3) 25 MCG (1000 UT) CAPS Oral   Cyanocobalamin (B-12 PO) Oral   fish oil-omega-3 fatty acids 1 g, Oral, Daily   ibuprofen (ADVIL) 800 mg, Oral, Every 8 hours PRN   tamsulosin (FLOMAX) 0.8 mg, Oral, Daily       Objective:   Physical Exam BP 126/68   Pulse (!) 58   Temp 97.6 F (36.4 C) (Oral)   Resp 18   Ht 5' 10.5" (1.791 m)   Wt 255 lb 6 oz (115.8  kg)   SpO2 96%   BMI 36.12 kg/m  General:   Well developed, NAD, BMI noted.  HEENT:  Normocephalic . Face symmetric, atraumatic Lungs:  CTA B Normal respiratory effort, no intercostal retractions, no accessory muscle use. Heart: RRR,  no murmur.  Abdomen:  Not distended, soft, non-tender. No rebound or rigidity.   Skin: Not pale. Not jaundice Lower extremities: no pretibial edema bilaterally.  No TTP on the trochanter versus.  Hip rotation: Normal bilaterally. Neurologic:  alert & oriented X3.  Speech normal, gait appropriate for age and unassisted.  DTRs symmetrically decreased at the lower extremities Psych--  Cognition and judgment appear intact.  Cooperative with normal attention span and concentration.  Behavior appropriate. No anxious or depressed appearing.     Assessment    Assessment Elevated BP Hyperlipidemia Elevated LFTs DJD Weaker L leg, saw neuro, NCS 01/2018 chronic L 3 radiculopathy Morbid obesity: BMI 36 + OSA OSA, + CPAP BCC, used to see Dr Garner Nash rosacea HOH, L  BPH w/LUTS sees urology Aortic ultrasound: 05/2020, no AAA  PLAN: Hyperlipidemia: Saw cardiology 2/ 2024, recommend to continue statins for  very mild coronary calcification noted on calcium coronary score. DJD: Has aches and pains, mostly at the left hip, exam today is benign, reports this is a "good day" but has states that it really hurts.  Takes ibuprofen 800 mg at x 3 times a day.  Fortunately no GI side effects. Plan: Refer to Ortho, try to minimize ibuprofen use (alternate one dose of Tylenol with one of ibuprofen daily.  See AVS). Check CBC BMP. LUTS: Transient, now back to baseline.  Check a UA urine culture.  He sees urology regularly for BPH. Vaccines I recommend: RSV RTC 5 months

## 2022-05-24 NOTE — Assessment & Plan Note (Signed)
Hyperlipidemia: Saw cardiology 2/ 2024, recommend to continue statins for very mild coronary calcification noted on calcium coronary score. DJD: Has aches and pains, mostly at the left hip, exam today is benign, reports this is a "good day" but has states that it really hurts.  Takes ibuprofen 800 mg at x 3 times a day.  Fortunately no GI side effects. Plan: Refer to Ortho, try to minimize ibuprofen use (alternate one dose of Tylenol with one of ibuprofen daily.  See AVS). Check CBC BMP. LUTS: Transient, now back to baseline.  Check a UA urine culture.  He sees urology regularly for BPH. Vaccines I recommend: RSV RTC 5 months

## 2022-05-24 NOTE — Patient Instructions (Addendum)
Vaccines I recommend:  RSV vaccine  For pain management, combining Tylenol and ibuprofen. Tylenol 500 mg 2 tablets a day Ibuprofen 200 mg: 2 to 3 tablets a day. Always take ibuprofen with food.  We are referring you to the orthopedic doctor    GO TO THE LAB : Get the blood work     GO TO THE FRONT DESK, PLEASE SCHEDULE YOUR APPOINTMENTS Come back for checkup by 10-2022

## 2022-05-25 LAB — URINE CULTURE
MICRO NUMBER:: 14910573
SPECIMEN QUALITY:: ADEQUATE

## 2022-05-30 DIAGNOSIS — M25552 Pain in left hip: Secondary | ICD-10-CM | POA: Diagnosis not present

## 2022-05-30 DIAGNOSIS — M545 Low back pain, unspecified: Secondary | ICD-10-CM | POA: Diagnosis not present

## 2022-06-07 DIAGNOSIS — M48061 Spinal stenosis, lumbar region without neurogenic claudication: Secondary | ICD-10-CM | POA: Diagnosis not present

## 2022-06-13 DIAGNOSIS — M6530 Trigger finger, unspecified finger: Secondary | ICD-10-CM | POA: Diagnosis not present

## 2022-06-13 DIAGNOSIS — M65331 Trigger finger, right middle finger: Secondary | ICD-10-CM | POA: Diagnosis not present

## 2022-06-14 DIAGNOSIS — M48061 Spinal stenosis, lumbar region without neurogenic claudication: Secondary | ICD-10-CM | POA: Diagnosis not present

## 2022-06-21 DIAGNOSIS — M48061 Spinal stenosis, lumbar region without neurogenic claudication: Secondary | ICD-10-CM | POA: Diagnosis not present

## 2022-06-28 DIAGNOSIS — M48061 Spinal stenosis, lumbar region without neurogenic claudication: Secondary | ICD-10-CM | POA: Diagnosis not present

## 2022-07-05 DIAGNOSIS — M48061 Spinal stenosis, lumbar region without neurogenic claudication: Secondary | ICD-10-CM | POA: Diagnosis not present

## 2022-07-19 DIAGNOSIS — M48061 Spinal stenosis, lumbar region without neurogenic claudication: Secondary | ICD-10-CM | POA: Diagnosis not present

## 2022-07-26 DIAGNOSIS — M48061 Spinal stenosis, lumbar region without neurogenic claudication: Secondary | ICD-10-CM | POA: Diagnosis not present

## 2022-08-02 DIAGNOSIS — M48061 Spinal stenosis, lumbar region without neurogenic claudication: Secondary | ICD-10-CM | POA: Diagnosis not present

## 2022-08-09 DIAGNOSIS — M48061 Spinal stenosis, lumbar region without neurogenic claudication: Secondary | ICD-10-CM | POA: Diagnosis not present

## 2022-08-14 ENCOUNTER — Telehealth: Payer: Self-pay | Admitting: Internal Medicine

## 2022-08-14 NOTE — Telephone Encounter (Signed)
Copied from CRM [id number]. Topic: Medicare AWV >> Aug 14, 2022  2:25 PM Payton Doughty wrote: Reason for CRM: Called 08/14/2022 to sched AWV - NO VOICEMAIL  Verlee Rossetti; Care Guide Ambulatory Clinical Support [place] l [facility] Direct Dial: [phone]

## 2022-08-30 DIAGNOSIS — M48061 Spinal stenosis, lumbar region without neurogenic claudication: Secondary | ICD-10-CM | POA: Diagnosis not present

## 2022-09-06 ENCOUNTER — Other Ambulatory Visit: Payer: Self-pay | Admitting: Internal Medicine

## 2022-09-06 DIAGNOSIS — M48061 Spinal stenosis, lumbar region without neurogenic claudication: Secondary | ICD-10-CM | POA: Diagnosis not present

## 2022-11-01 ENCOUNTER — Other Ambulatory Visit (HOSPITAL_BASED_OUTPATIENT_CLINIC_OR_DEPARTMENT_OTHER): Payer: Self-pay

## 2022-11-01 DIAGNOSIS — Z23 Encounter for immunization: Secondary | ICD-10-CM | POA: Diagnosis not present

## 2022-11-01 MED ORDER — INFLUENZA VAC A&B SURF ANT ADJ 0.5 ML IM SUSY
0.5000 mL | PREFILLED_SYRINGE | Freq: Once | INTRAMUSCULAR | 0 refills | Status: AC
  Filled 2022-11-01: qty 0.5, 1d supply, fill #0

## 2022-11-08 ENCOUNTER — Encounter: Payer: Self-pay | Admitting: Internal Medicine

## 2022-11-08 ENCOUNTER — Other Ambulatory Visit (HOSPITAL_BASED_OUTPATIENT_CLINIC_OR_DEPARTMENT_OTHER): Payer: Self-pay

## 2022-11-08 ENCOUNTER — Ambulatory Visit (INDEPENDENT_AMBULATORY_CARE_PROVIDER_SITE_OTHER): Payer: Medicare Other | Admitting: Internal Medicine

## 2022-11-08 VITALS — BP 132/68 | HR 51 | Temp 98.1°F | Resp 18 | Ht 70.5 in | Wt 261.5 lb

## 2022-11-08 DIAGNOSIS — C4491 Basal cell carcinoma of skin, unspecified: Secondary | ICD-10-CM | POA: Diagnosis not present

## 2022-11-08 DIAGNOSIS — G4733 Obstructive sleep apnea (adult) (pediatric): Secondary | ICD-10-CM

## 2022-11-08 DIAGNOSIS — E785 Hyperlipidemia, unspecified: Secondary | ICD-10-CM

## 2022-11-08 DIAGNOSIS — Z23 Encounter for immunization: Secondary | ICD-10-CM | POA: Diagnosis not present

## 2022-11-08 LAB — LIPID PANEL
Cholesterol: 155 mg/dL (ref 0–200)
HDL: 43 mg/dL (ref >39.00)
LDL Cholesterol: 83 mg/dL (ref 0–99)
NonHDL: 112.34
Total CHOL/HDL Ratio: 4
Triglycerides: 148 mg/dL (ref 0.0–149.0)
VLDL: 29.6 mg/dL (ref 0.0–40.0)

## 2022-11-08 LAB — ALT: ALT: 44 U/L (ref 0–53)

## 2022-11-08 LAB — AST: AST: 40 U/L — ABNORMAL HIGH (ref 0–37)

## 2022-11-08 MED ORDER — COVID-19 MRNA VAC-TRIS(PFIZER) 30 MCG/0.3ML IM SUSY
0.3000 mL | PREFILLED_SYRINGE | Freq: Once | INTRAMUSCULAR | 0 refills | Status: AC
  Filled 2022-11-08: qty 0.3, 1d supply, fill #0

## 2022-11-08 NOTE — Progress Notes (Unsigned)
Subjective:    Patient ID: Dale [name], DDS, male    DOB: 1950/07/25, 72 y.o.   MRN: [id number]  DOS:  11/08/2022 Type of visit - description: f/u, here with his wife  Chronic medical problems addressed. History of DJD, doing physical therapy. No recent ambulatory BPs. LUTS mild, at baseline.   Review of Systems See above   Past Medical History:  Diagnosis Date   BCC (basal cell carcinoma of skin)    dr Terri Piedra   BPH (benign prostatic hyperplasia)    Decreased hearing    Left, saw audiologist in 2011 per pt    DJD (degenerative joint disease)    Elevated liver function tests    Hyperlipidemia    mixed   Hypertension    Obesity    Ocular rosacea    on doxy as of 01-2013   OSA (obstructive sleep apnea)    on CPAP   Tubular adenoma of colon 2017    Past Surgical History:  Procedure Laterality Date   COLONOSCOPY W/ POLYPECTOMY  10/20/2015   tubular adenoma   TONSILLECTOMY      Current Outpatient Medications  Medication Instructions   atorvastatin (LIPITOR) 10 mg, Oral, Daily at bedtime   Cholecalciferol (VITAMIN D-3) 25 MCG (1000 UT) CAPS Oral   Cyanocobalamin (B-12 PO) Oral   fish oil-omega-3 fatty acids 1 g, Oral, Daily   ibuprofen (ADVIL) 800 mg, Oral, Every 8 hours PRN   tamsulosin (FLOMAX) 0.8 mg, Oral, Daily       Objective:   Physical Exam BP 132/68   Pulse (!) 51   Temp 98.1 F (36.7 C) (Oral)   Resp 18   Ht 5' 10.5" (1.791 m)   Wt 261 lb 8 oz (118.6 kg)   SpO2 97%   BMI 36.99 kg/m  General:   Well developed, NAD, BMI noted. HEENT:  Normocephalic . Face symmetric, atraumatic Lungs:  CTA B Normal respiratory effort, no intercostal retractions, no accessory muscle use. Heart: RRR,  no murmur.  Lower extremities: no pretibial edema bilaterally  Skin: Not pale. Not jaundice Neurologic:  alert & oriented X3.  Speech normal, gait appropriate for age and unassisted Psych--  Cognition and judgment appear intact.  Cooperative with  normal attention span and concentration.  Behavior appropriate. No anxious or depressed appearing.      Assessment   Assessment Elevated BP Hyperlipidemia Elevated LFTs DJD Weaker L leg, saw neuro, NCS 01/2018 chronic L 3 radiculopathy Morbid obesity: BMI 36 + OSA OSA, + CPAP BCC- sees derm    Ocular rosacea-  HOH, L  BPH w/LUTS sees urology Aortic ultrasound: 05/2020, no AAA  PLAN:  Elevated BP: BP is normal today. Hyperlipidemia: On Lipitor, check AST ALT FLP. Chronic increased LFTs: Checking labs DJD: Doing PT regularly. Dermatology: Had Mohs surgery left ear, recuperated well. BPH with LUTS: Sees urology regularly, symptoms at baseline. OSA: Good CPAP compliance Preventive care reviewed  - Tdap 09-2018 -zostavax ~ 2010; s/p shingrix x 2 - PNM 23:2016, 2022 - PNM 13: 2017  -Had a flu shot  - Rec RSV and Covid booster  -CCS: Cscope w/ Dr Loreta Ave ([facility])  ~ 2008, had polyps, redundant colon Cscope 07/2015, wnl per pt, cscope 06/2021  -Sees urology regulalrly -Advance care planning info provid  RTC 6 months

## 2022-11-08 NOTE — Patient Instructions (Addendum)
Vaccines I recommend: Covid booster RSV vaccine       GO TO THE LAB : Get the blood work     Next visit with me in 6 months Please schedule it at the front desk        "Health Care Power of attorney" ,  "Living will" (Advance care planning documents)  If you already have a living will or healthcare power of attorney, is recommended you bring the copy to be scanned in your chart.   The document will be available to all the doctors you see in the system.  Advance care planning is a process that supports adults in  understanding and sharing their preferences regarding future medical care.  The patient's preferences are recorded in documents called Advance Directives and the can be modified at any time while the patient is in full mental capacity.   If you don't have one, please consider create one.      More information at: [email or web address]

## 2022-11-09 NOTE — Assessment & Plan Note (Signed)
Elevated BP: BP is normal today. Hyperlipidemia: On Lipitor, check AST ALT FLP. Chronic increased LFTs: Checking labs DJD: Doing PT regularly. Dermatology: Had Mohs surgery left ear, recuperated well. BPH with LUTS: Sees urology regularly, symptoms at baseline. OSA: Good CPAP compliance Preventive care reviewed RTC 6 months

## 2022-11-09 NOTE — Assessment & Plan Note (Signed)
Preventive care reviewed - Tdap 09-2018 -zostavax ~ 2010; s/p shingrix x 2 - PNM 23:2016, 2022 - PNM 13: 2017  -Had a flu shot  - Rec RSV and Covid booster  -CCS: Cscope w/ Dr Loreta Ave ([facility])  ~ 2008, had polyps, redundant colon Cscope 07/2015, wnl per pt, cscope 06/2021 , next per GI -Sees urology regulalrly -Advance care planning info provid

## 2022-11-15 ENCOUNTER — Other Ambulatory Visit (HOSPITAL_BASED_OUTPATIENT_CLINIC_OR_DEPARTMENT_OTHER): Payer: Self-pay

## 2022-11-15 MED ORDER — RSVPREF3 VAC RECOMB ADJUVANTED 120 MCG/0.5ML IM SUSR
0.5000 mL | Freq: Once | INTRAMUSCULAR | 0 refills | Status: AC
  Filled 2022-11-15: qty 0.5, 1d supply, fill #0

## 2023-01-02 ENCOUNTER — Ambulatory Visit: Payer: Medicare Other | Admitting: Neurology

## 2023-01-02 ENCOUNTER — Encounter: Payer: Self-pay | Admitting: Neurology

## 2023-01-02 VITALS — BP 145/72 | HR 63 | Ht 70.0 in | Wt 260.0 lb

## 2023-01-02 DIAGNOSIS — G4733 Obstructive sleep apnea (adult) (pediatric): Secondary | ICD-10-CM

## 2023-01-02 NOTE — Progress Notes (Signed)
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 male.  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  The patient is unaccompanied today and presents for his yearly check up.  I last saw him on 12/27/2021, at which time he was doing well, he was compliant with treatment, machine was working fine.   Today, 01/02/2023: I reviewed his CPAP compliance data from 12/02/2022 through 12/31/2022, which is a total of 30 days, during which time he used his machine every night with percent use days greater than 4 hours at 100%, indicating superb compliance with an average usage of 7 hours and 45 minutes, residual AHI at goal at 0.4/h, leak acceptable, intact, no side, with the 95th percentile at 0.8 L/min on a pressure of 11 cm with EPR of 3. He reports doing well doing well.  Works keeps him very busy.  Other than that he is doing fine.  He has not had any trouble with his machine, no error message.  He reports full compliance and ongoing benefit.   The patient's allergies, current medications, family history, past medical history, past social history, past surgical history and problem list were reviewed and updated as appropriate.    Previously:  I saw him on 12/21/2020, at which time he was compliant with his CPAP of 11 cm.  He still had some residual snoring but he has had some issues with leaking from the mask and we mutually agreed to continue with his CPAP pressure at 11 cm.  He was doing well.  He was advised to follow-up routinely in 1 year.   I reviewed his CPAP compliance data from 11/27/2021 through 12/26/2021, which is a total of 30 days, during which time he used his machine every night  with percent use days greater than 4 hours at 100%, indicating superb compliance, average usage of 7 hours and 51 minutes, residual AHI at goal at 0.3/h, leak on the low side with the 95th percentile at 0.2 L/min on a pressure of 11 cm with EPR of 3.     I saw him on 10/14/2019, at which time he was compliant with his CPAP and doing well.  He did have some residual snoring and we mutually agreed to increase his pressure from 10 cm to 11 cm.  He was advised to follow-up routinely in 1 year.   I reviewed his CPAP compliance data from the past 30 days, between 11/21/2020 through 12/20/2020, during which time he used his machine every night with percent use days greater than 4 hours at 100%, indicating superb compliance, average usage of 8 hours and 27 minutes, residual AHI low at 0.3/h, leak low with a 95th percentile at 1.9 L/min on a pressure of 11 cm with EPR of 3.       I saw him on 10/08/2018, at which time he was fully compliant with his CPAP.  He had some residual snoring and we mutually agreed to increase his pressure from 9 cm to 10 cm at the time.   I reviewed his CPAP compliance data from 09/13/2019 through 10/12/2019, which is a total of 30 days, during which time he used his machine every night with percent use days greater than 4 hours at  100%, indicating superb compliance with an average usage of 8 hours and 5 minutes, residual AHI at goal at 0.3/h, leak on the low side with a 95th percentile at 1.5 L/min on a pressure of 10 cm with EPR of 3.     10/02/2017, at which time he had started using his new CPAP machine.  He was compliant with treatment.  He was advised to follow-up yearly.     I reviewed his CPAP compliance data from 09/07/2018 through 10/06/2018 which is a total of 30 days, during which time he used his machine every night with percent use days greater than 4 hours at 100%, indicating superb compliance with an average usage of 7 hours and 49 minutes, residual AHI 0.5/h, at goal, leak on  the low side with a 95th percentile at 2.4 L/min on a pressure of 9 cm with EPR of 3.      I first met him on 11/14/2016 at the request of his primary care physician, at which time he reported a prior diagnosis of obstructive sleep apnea but needed reevaluation as it had been several years and he had an older CPAP machine. He was advised to proceed with sleep study testing. He had a baseline sleep study, followed by a CPAP titration study. I went over his test results with him in detail today. Baseline sleep study from 01/23/2017 showed a sleep efficiency of 80.1 minutes, sleep latency of 5.5 minutes and REM latency mildly delayed at 147 minutes. He had an increased percentage of slow-wave sleep and a decreased percentage of REM sleep. Total AHI was 12.6 per hour, REM AHI was 78.9 per hour. Average oxygen saturation was 96%, nadir was 83%. He had no significant PLMS. He was advised to proceed with a CPAP titration study. He had this on 03/27/2017. Sleep efficiency was 99.2%, sleep latency 1.5 minutes and REM latency normal at 81 minutes. He had a normal percentage of slow-wave sleep and REM sleep was near normal at 18.4%. He was fitted with a small full face mask and CPAP was titrated from 5 cm to 9 cm. On the final titration pressure his AHI was 0 per hour with brief supine REM sleep achieved an O2 nadir of 91%. Based on his test results I prescribed CPAP therapy for home use with a new machine and new equipment at a pressure of 9 cm.   I reviewed his CPAP compliance data from 09/01/2017 through 09/30/2017 which is a total of 30 days, during which time he used his CPAP 29 days with percent used days greater than 4 hours at 97%, indicating excellent compliance with an average usage of 7 hours and 9 minutes, residual AHI of 0.4 per hour, leak on the low side with the 95th percentile at 5.9 L/m on a pressure of 9 cm with EPR of 3.    11/14/2016: (He) was previously diagnosed with obstructive sleep apnea and  placed on CPAP therapy. His sleep study was over 10 years ago, prior study results are not available for my review today. A CPAP download was not available for my review today as the machine is older. I reviewed your office note from 08/23/2016. His Epworth sleepiness score is 7 out of 24, fatigue score is 10 out of 63. He lives with his wife and son. He has 3 children. He works as a Education officer, community. He quit smoking in 1979 and drinks alcohol about 3 times a week and reports drinking caffeine "a lot": multiple cups of coffee  per day. His wife reports that he is not necessarily all that well rested, when he first started on CPAP some 13 years ago he had a great response to it, felt a big difference in his daytime energy. He does report an approximately 30 pound weight gain since his original diagnosis. He has 2 daughters, ages 55 and 36, their 68 year old son lives with them. He had issues with leg cramps for a while. He temporarily stopped his atorvastatin at the time but has been able to restart it. Denies FHx of OSA and has no Sx of RLS. He has no night to night nocturia and denies morning headaches.     His Past Medical History Is Significant For: Past Medical History:  Diagnosis Date   BCC (basal cell carcinoma of skin)    dr Terri Piedra   BPH (benign prostatic hyperplasia)    Decreased hearing    Left, saw audiologist in 2011 per pt    DJD (degenerative joint disease)    Elevated liver function tests    Hyperlipidemia    mixed   Hypertension    Obesity    Ocular rosacea    on doxy as of 01-2013   OSA (obstructive sleep apnea)    on CPAP   Tubular adenoma of colon 2017    His Past Surgical History Is Significant For: Past Surgical History:  Procedure Laterality Date   COLONOSCOPY W/ POLYPECTOMY  10/20/2015   tubular adenoma   TONSILLECTOMY      His Family History Is Significant For: Family History  Problem Relation Age of Onset   Diabetes Mother    Hypertension Mother    Prostate cancer  Father 74       age 90s died at 30, also an UNCLE   Heart failure Maternal Grandmother    Coronary artery disease Paternal Grandmother    Esophageal cancer Paternal Grandfather    Colon cancer Neg Hx    Stroke Neg Hx    Sleep apnea Neg Hx     His Social History Is Significant For: Social History   Socioeconomic History   Marital status: Married    Spouse name: Not on file   Number of children: 3   Years of education: Not on file   Highest education level: Professional school degree (e.g., MD, DDS, DVM, JD)  Occupational History   Occupation: dentist  Tobacco Use   Smoking status: Former    Current packs/day: 0.00    Types: Cigarettes    Quit date: 11/21/1968    Years since quitting: 54.1   Smokeless tobacco: Never   Tobacco comments:    quit late 70s  Vaping Use   Vaping status: Never Used  Substance and Sexual Activity   Alcohol use: Yes    Comment: 2 cocktails per week   Drug use: No   Sexual activity: Not on file  Other Topics Concern   Not on file  Social History Narrative   Lives with his wife in a 2 story home with a basement.  Has 3 children.  Works as a Education officer, community.  Education: Freight forwarder.    Social Drivers of Corporate investment banker Strain: Low Risk  (01/25/2021)   Overall Financial Resource Strain (CARDIA)    Difficulty of Paying Living Expenses: Not hard at all  Food Insecurity: No Food Insecurity (11/07/2022)   Hunger Vital Sign    Worried About Running Out of Food in the Last Year: Never true    Ran Out  of Food in the Last Year: Never true  Transportation Needs: No Transportation Needs (11/07/2022)   PRAPARE - Administrator, Civil Service (Medical): No    Lack of Transportation (Non-Medical): No  Physical Activity: Unknown (11/07/2022)   Exercise Vital Sign    Days of Exercise per Week: 0 days    Minutes of Exercise per Session: Not on file  Stress: No Stress Concern Present (11/07/2022)   [employer] of Occupational Health -  Occupational Stress Questionnaire    Feeling of Stress : Only a little  Social Connections: Unknown (11/07/2022)   Social Connection and Isolation Panel [NHANES]    Frequency of Communication with Friends and Family: Once a week    Frequency of Social Gatherings with Friends and Family: Patient declined    Attends Religious Services: Never    Database administrator or Organizations: No    Attends Engineer, structural: Not on file    Marital Status: Married    His Allergies Are:  No Known Allergies:   His Current Medications Are:  Outpatient Encounter Medications as of 01/02/2023  Medication Sig   atorvastatin (LIPITOR) 10 MG tablet Take 1 tablet (10 mg total) by mouth at bedtime.   Cholecalciferol (VITAMIN D-3) 25 MCG (1000 UT) CAPS Take by mouth.   Cyanocobalamin (B-12 PO) Take by mouth.   fish oil-omega-3 fatty acids 1000 MG capsule Take 1 g by mouth daily.   tamsulosin (FLOMAX) 0.4 MG CAPS capsule Take 0.8 mg by mouth daily.    ibuprofen (ADVIL,MOTRIN) 800 MG tablet Take 800 mg by mouth every 8 (eight) hours as needed. (Patient not taking: Reported on 01/02/2023)   No facility-administered encounter medications on file as of 01/02/2023.  :  Review of Systems:  Out of a complete 14 point review of systems, all are reviewed and negative with the exception of these symptoms as listed below:   Review of Systems  Neurological:        Pt is here for OSA with CPAP follow up. Pt states he is doing well with CPAP. No concerns. ESS 5    Objective:  Neurological Exam  Physical Exam Physical Examination:   Vitals:   01/02/23 1254  BP: (!) 145/72  Pulse: 63   General Examination: The patient is a very pleasant 72 y.o. male in no acute distress. He appears well-developed and well-nourished and well groomed.   HEENT: Normocephalic, atraumatic, pupils are equal, round and reactive, tracking well-preserved, hearing grossly intact.  Face is symmetric with normal facial  animation, speech is clear without dysarthria, hypophonia or voice tremor.  No carotid bruits.  All stable findings.   Chest: Clear to auscultation without wheezing, rhonchi or crackles noted.   Heart: S1+S2+0, regular and normal without murmurs, rubs or gallops noted.    Abdomen: Soft, non-tender and non-distended.   Extremities: There is no obvious edema in the distal lower extremities bilaterally.     Skin: Warm and dry without trophic changes noted. Rosacea around nose, stable.    Musculoskeletal: exam reveals no obvious joint deformities.    Neurologically:  Mental status: The patient is awake, alert and oriented in all 4 spheres. His immediate and remote memory, attention, language skills and fund of knowledge are appropriate. There is no evidence of aphasia, agnosia, apraxia or anomia. Speech is clear with normal prosody and enunciation. Thought process is linear. Mood is normal and affect is normal.  Cranial nerves II - XII are as described above  under HEENT exam.  Motor exam: Normal bulk, strength and tone is noted. There is no obvious tremor.  Fine motor skills and coordination: grossly intact.  Cerebellar testing: No dysmetria or intention tremor. There is no truncal or gait ataxia.  Sensory exam: intact to light touch in the upper and lower extremities.  Gait, station and balance: He stands easily. No veering to one side is noted. No leaning to one side is noted. Posture is age-appropriate and stance is narrow based. Gait shows normal stride length and normal pace. No problems turning are noted.         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  He  has ongoing great compliance and ongoing benefit from treatment.  He is highly commended for his treatment adherence.  He is not having any trouble with his current machine, not keen on pursuing a new machine at this time.   We may consider a home sleep test next year for reassessment and issuing a new machine but for now he is agreeable to maintaining treatment on this machine.  He has not seen an error message on it.  I answered all his questions today and he was in agreement with our plan.

## 2023-01-02 NOTE — Patient Instructions (Signed)
It was good to see you again and to catch up.  You are fully compliant with your CPAP and we can see you in a year.  If you have trouble with your CPAP machine, especially if you see an error message on it, let us know and may be time to get you a new CPAP.

## 2023-02-22 ENCOUNTER — Other Ambulatory Visit: Payer: Self-pay | Admitting: Internal Medicine

## 2023-04-17 DIAGNOSIS — R350 Frequency of micturition: Secondary | ICD-10-CM | POA: Diagnosis not present

## 2023-04-17 DIAGNOSIS — R3912 Poor urinary stream: Secondary | ICD-10-CM | POA: Diagnosis not present

## 2023-04-17 DIAGNOSIS — R3915 Urgency of urination: Secondary | ICD-10-CM | POA: Diagnosis not present

## 2023-04-17 DIAGNOSIS — N401 Enlarged prostate with lower urinary tract symptoms: Secondary | ICD-10-CM | POA: Diagnosis not present

## 2023-05-16 ENCOUNTER — Ambulatory Visit: Payer: Medicare Other | Admitting: Internal Medicine

## 2023-06-13 NOTE — Progress Notes (Signed)
 HPI: Follow-up coronary calcification. Abdominal ultrasound May 2022 showed no abdominal aortic aneurysm. Calcium  score June 2022 1 which was 13th percentile. Since last seen the patient denies any dyspnea on exertion, orthopnea, PND, pedal edema, palpitations, syncope or chest pain.   Current Outpatient Medications  Medication Sig Dispense Refill   atorvastatin  (LIPITOR) 10 MG tablet Take 1 tablet (10 mg total) by mouth at bedtime. 90 tablet 1   Cholecalciferol (VITAMIN D-3) 25 MCG (1000 UT) CAPS Take by mouth.     Cyanocobalamin  (B-12 PO) Take by mouth.     fish oil-omega-3 fatty acids 1000 MG capsule Take 1 g by mouth daily.     ibuprofen (ADVIL,MOTRIN) 800 MG tablet Take 800 mg by mouth every 8 (eight) hours as needed.     tamsulosin  (FLOMAX ) 0.4 MG CAPS capsule Take 0.8 mg by mouth daily.      No current facility-administered medications for this visit.     Past Medical History:  Diagnosis Date   BCC (basal cell carcinoma of skin)    dr Fleurette Humbles   BPH (benign prostatic hyperplasia)    Decreased hearing    Left, saw audiologist in 2011 per pt    DJD (degenerative joint disease)    Elevated liver function tests    Hyperlipidemia    mixed   Hypertension    Obesity    Ocular rosacea    on doxy as of 01-2013   OSA (obstructive sleep apnea)    on CPAP   Tubular adenoma of colon 2017    Past Surgical History:  Procedure Laterality Date   COLONOSCOPY W/ POLYPECTOMY  10/20/2015   tubular adenoma   TONSILLECTOMY      Social History   Socioeconomic History   Marital status: Married    Spouse name: Not on file   Number of children: 3   Years of education: Not on file   Highest education level: Professional school degree (e.g., MD, DDS, DVM, JD)  Occupational History   Occupation: dentist  Tobacco Use   Smoking status: Former    Current packs/day: 0.00    Types: Cigarettes    Quit date: 11/21/1968    Years since quitting: 54.6   Smokeless tobacco: Never    Tobacco comments:    quit late 70s  Vaping Use   Vaping status: Never Used  Substance and Sexual Activity   Alcohol use: Yes    Comment: 2 cocktails per week   Drug use: No   Sexual activity: Not on file  Other Topics Concern   Not on file  Social History Narrative   Lives with his wife in a 2 story home with a basement.  Has 3 children.  Works as a Education officer, community.  Education: Freight forwarder.    Social Drivers of Corporate investment banker Strain: Low Risk  (01/25/2021)   Overall Financial Resource Strain (CARDIA)    Difficulty of Paying Living Expenses: Not hard at all  Food Insecurity: No Food Insecurity (11/07/2022)   Hunger Vital Sign    Worried About Running Out of Food in the Last Year: Never true    Ran Out of Food in the Last Year: Never true  Transportation Needs: No Transportation Needs (11/07/2022)   PRAPARE - Administrator, Civil Service (Medical): No    Lack of Transportation (Non-Medical): No  Physical Activity: Unknown (11/07/2022)   Exercise Vital Sign    Days of Exercise per Week: 0 days  Minutes of Exercise per Session: Not on file  Stress: No Stress Concern Present (11/07/2022)   [employer] of Occupational Health - Occupational Stress Questionnaire    Feeling of Stress : Only a little  Social Connections: Unknown (11/07/2022)   Social Connection and Isolation Panel [NHANES]    Frequency of Communication with Friends and Family: Once a week    Frequency of Social Gatherings with Friends and Family: Patient declined    Attends Religious Services: Never    Database administrator or Organizations: No    Attends Engineer, structural: Not on file    Marital Status: Married  Intimate Partner Violence: Unknown (04/25/2021)   Received from [employer], [facility]   HITS    Physically Hurt: Not on file    Insult or Talk Down To: Not on file    Threaten Physical Harm: Not on file    Scream or Curse: Not on file    Family History   Problem Relation Age of Onset   Diabetes Mother    Hypertension Mother    Prostate cancer Father 84       age 42s died at 62, also an UNCLE   Heart failure Maternal Grandmother    Coronary artery disease Paternal Grandmother    Esophageal cancer Paternal Grandfather    Colon cancer Neg Hx    Stroke Neg Hx    Sleep apnea Neg Hx     ROS: no fevers or chills, productive cough, hemoptysis, dysphasia, odynophagia, melena, hematochezia, dysuria, hematuria, rash, seizure activity, orthopnea, PND, pedal edema, claudication. Remaining systems are negative.  Physical Exam: Well-developed well-nourished in no acute distress.  Skin is warm and dry.  HEENT is normal.  Neck is supple.  Chest is clear to auscultation with normal expansion.  Cardiovascular exam is regular rate and rhythm.  Abdominal exam nontender or distended. No masses palpated. Extremities show no edema. neuro grossly intact  EKG Interpretation Date/Time:  Friday June 27 2023 08:53:24 EDT Ventricular Rate:  52 PR Interval:  182 QRS Duration:  84 QT Interval:  418 QTC Calculation: 388 R Axis:   -15  Text Interpretation: Sinus bradycardia Confirmed by Alexandria Angel ([id number]) on 06/27/2023 8:54:11 AM    A/P  1 coronary calcification-minimal calcium  noted on previous calcium  score.  Continue statin.  He denies chest pain.  2 hyperlipidemia-last LDL October 2024 83.  Increase Lipitor to 40 mg daily.  Check lipids and liver in 8 weeks.   Alexandria Angel, MD

## 2023-06-27 ENCOUNTER — Ambulatory Visit: Payer: Medicare Other | Attending: Cardiology | Admitting: Cardiology

## 2023-06-27 ENCOUNTER — Encounter: Payer: Self-pay | Admitting: Cardiology

## 2023-06-27 VITALS — BP 120/58 | HR 52 | Ht 70.0 in | Wt 258.0 lb

## 2023-06-27 DIAGNOSIS — E785 Hyperlipidemia, unspecified: Secondary | ICD-10-CM | POA: Insufficient documentation

## 2023-06-27 DIAGNOSIS — I2510 Atherosclerotic heart disease of native coronary artery without angina pectoris: Secondary | ICD-10-CM | POA: Diagnosis not present

## 2023-06-27 MED ORDER — ATORVASTATIN CALCIUM 40 MG PO TABS
40.0000 mg | ORAL_TABLET | Freq: Every day | ORAL | 3 refills | Status: AC
Start: 2023-06-27 — End: ?

## 2023-06-27 NOTE — Patient Instructions (Signed)
 Medication Instructions:   INCREASE ATORVASTATIN  TO 40 MG ONCE DAILY=4 OF THE 10 MG TABLETS ONCE DAILY  *If you need a refill on your cardiac medications before your next appointment, please call your pharmacy*  Lab Work:  Your physician recommends that you return for lab work in: [facility]  If you have labs (blood work) drawn today and your tests are completely normal, you will receive your results only by: MyChart Message (if you have MyChart) OR A paper copy in the mail If you have any lab test that is abnormal or we need to change your treatment, we will call you to review the results.   Follow-Up: At [facility], you and your health needs are our priority.  As part of our continuing mission to provide you with exceptional heart care, our providers are all part of one team.  This team includes your primary Cardiologist (physician) and Advanced Practice Providers or APPs (Physician Assistants and Nurse Practitioners) who all work together to provide you with the care you need, when you need it.  Your next appointment:   12 month(s)  Provider:   Alexandria Angel MD

## 2023-10-16 ENCOUNTER — Telehealth: Payer: Self-pay | Admitting: Internal Medicine

## 2023-10-16 NOTE — Telephone Encounter (Unsigned)
 Copied from CRM [id number]. Topic: Clinical - Refused Triage >> Oct 16, 2023  1:50 PM Taleah C wrote: Patient/caller voiced complaints of fall a few mo's ago/hip pain. Declined transfer to triage.

## 2023-10-17 NOTE — Telephone Encounter (Signed)
 Patient refused nurse triage. He is scheduled on 10/20/23 for hip pain from fall several months ago.

## 2023-10-20 ENCOUNTER — Encounter: Payer: Self-pay | Admitting: Internal Medicine

## 2023-10-20 ENCOUNTER — Ambulatory Visit (INDEPENDENT_AMBULATORY_CARE_PROVIDER_SITE_OTHER): Admitting: Internal Medicine

## 2023-10-20 ENCOUNTER — Encounter: Payer: Self-pay | Admitting: *Deleted

## 2023-10-20 VITALS — BP 126/64 | HR 66 | Temp 97.8°F | Resp 16 | Ht 70.0 in | Wt 256.1 lb

## 2023-10-20 DIAGNOSIS — Z23 Encounter for immunization: Secondary | ICD-10-CM | POA: Diagnosis not present

## 2023-10-20 DIAGNOSIS — W19XXXA Unspecified fall, initial encounter: Secondary | ICD-10-CM

## 2023-10-20 DIAGNOSIS — M25551 Pain in right hip: Secondary | ICD-10-CM | POA: Diagnosis not present

## 2023-10-20 NOTE — Progress Notes (Unsigned)
 Subjective:    Patient ID: Dale [name], DDS, male    DOB: 07-Oct-1950, 73 y.o.   MRN: [id number]  DOS:  10/20/2023 Type of visit - description: Acute, here with his wife  About 6 weeks ago, was working on his yard, stepped on a wet rock and had a fall, in the process he split his legs wide open.  Since then is having pain on the inner side of the right hip.  Increased with certain movements and at night. He also has a deep ache at the right gluteal area and some tingling on the external aspect of the right foot.  At the time did not suffer any head or neck injury. He did injure his right knee and elbow but they are doing fine now.  Review of Systems See above   Past Medical History:  Diagnosis Date   BCC (basal cell carcinoma of skin)    dr ivin   BPH (benign prostatic hyperplasia)    Decreased hearing    Left, saw audiologist in 2011 per pt    DJD (degenerative joint disease)    Elevated liver function tests    Hyperlipidemia    mixed   Hypertension    Obesity    Ocular rosacea    on doxy as of 01-2013   OSA (obstructive sleep apnea)    on CPAP   Tubular adenoma of colon 2017    Past Surgical History:  Procedure Laterality Date   COLONOSCOPY W/ POLYPECTOMY  10/20/2015   tubular adenoma   TONSILLECTOMY      Current Outpatient Medications  Medication Instructions   atorvastatin  (LIPITOR) 40 mg, Oral, Daily at bedtime   Cholecalciferol (VITAMIN D-3) 25 MCG (1000 UT) CAPS Take by mouth.   Cyanocobalamin  (B-12 PO) Take by mouth.   fish oil-omega-3 fatty acids 1 g, Daily   ibuprofen (ADVIL) 800 mg, Every 8 hours PRN   tamsulosin  (FLOMAX ) 0.8 mg, Daily       Objective:   Physical Exam BP 126/64   Pulse 66   Temp 97.8 F (36.6 C) (Oral)   Resp 16   Ht 5' 10 (1.778 m)   Wt 256 lb 2 [name] (116.2 kg)   SpO2 97%   BMI 36.75 kg/m  General:   Well developed, NAD, BMI noted. HEENT:  Normocephalic . Face symmetric, atraumatic MSK: Slightly TTP on the low  back.  (Not new per patient) Hips: No TTP at the trochanteric bursas, hip rotation normal with some pain triggered by the rotation of the right hip. Lower extremities: no pretibial edema bilaterally  Skin: Not pale. Not jaundice Neurologic:  alert & oriented X3.  Speech normal, gait appropriate for age and unassisted.  Motor symmetric.  DTRs: Decrease in both legs (not new per patient) Psych--  Cognition and judgment appear intact.  Cooperative with normal attention span and concentration.  Behavior appropriate. No anxious or depressed appearing.      Assessment     Assessment Elevated BP Hyperlipidemia Elevated LFTs DJD Weaker L leg, saw neuro, NCS 01/2018 chronic L 3 radiculopathy Morbid obesity: BMI 36 + OSA OSA, + CPAP BCC- sees derm    Ocular rosacea-  HOH, L  BPH w/LUTS sees urology Aortic ultrasound: 05/2020, no AAA  PLAN Hip injury: Had a fall 6 weeks ago with accidental leg split.  Since then is having pain at the inner right hip. Plan: Refer to orthopedics for further evaluation.  We talk about a muscle relaxant but  he does not think he is going to help. Flu shot today RTC for checkup, he is overdue.

## 2023-10-20 NOTE — Patient Instructions (Signed)
 We are referring you to [facility] for the evaluation of the right hip pain. You are getting a flu shot today Recommend to proceed with a COVID booster   Then, go to the front desk for the checkout Please make an appointment for a checkup in the next few weeks

## 2023-10-21 NOTE — Assessment & Plan Note (Signed)
 Hip injury: Had a fall 6 weeks ago with accidental leg split.  Since then is having pain at the inner right hip. Plan: Refer to orthopedics for further evaluation.  We talk about a muscle relaxant but he does not think he is going to help. Flu shot today RTC for checkup, he is overdue.

## 2023-11-06 DIAGNOSIS — M7061 Trochanteric bursitis, right hip: Secondary | ICD-10-CM | POA: Diagnosis not present

## 2023-11-06 DIAGNOSIS — M25551 Pain in right hip: Secondary | ICD-10-CM | POA: Diagnosis not present

## 2023-11-07 ENCOUNTER — Other Ambulatory Visit (HOSPITAL_BASED_OUTPATIENT_CLINIC_OR_DEPARTMENT_OTHER): Payer: Self-pay

## 2023-11-07 ENCOUNTER — Encounter: Payer: Self-pay | Admitting: Internal Medicine

## 2023-11-07 ENCOUNTER — Ambulatory Visit (INDEPENDENT_AMBULATORY_CARE_PROVIDER_SITE_OTHER): Admitting: Internal Medicine

## 2023-11-07 VITALS — BP 116/76 | HR 59 | Temp 97.8°F | Resp 16 | Ht 70.0 in | Wt 254.5 lb

## 2023-11-07 DIAGNOSIS — Z23 Encounter for immunization: Secondary | ICD-10-CM | POA: Diagnosis not present

## 2023-11-07 DIAGNOSIS — G4733 Obstructive sleep apnea (adult) (pediatric): Secondary | ICD-10-CM | POA: Diagnosis not present

## 2023-11-07 DIAGNOSIS — M25551 Pain in right hip: Secondary | ICD-10-CM | POA: Diagnosis not present

## 2023-11-07 MED ORDER — ZEPBOUND 2.5 MG/0.5ML ~~LOC~~ SOAJ: 2.5000 mg | SUBCUTANEOUS | 0 refills | Status: AC

## 2023-11-07 MED ORDER — COMIRNATY 30 MCG/0.3ML IM SUSY
0.3000 mL | PREFILLED_SYRINGE | Freq: Once | INTRAMUSCULAR | 0 refills | Status: AC
  Filled 2023-11-07: qty 0.3, 1d supply, fill #0

## 2023-11-07 NOTE — Patient Instructions (Signed)
 Go to the front desk for the checkout Please make an appointment for a checkup in 4 to 5 months   I will send a prescription for Zepbound 1 injection weekly. This is a GLP-1 that will help you with obesity and sleep apnea. If is covered and you decide to proceed, call us  after your fourth injection to see if you like it and we can get you on a higher dose.     YOUR PLAN:  OBESITY: Your BMI is 36.  We discussed the potential benefits and side effects of GLP-1 agonists like Wegovy for weight management. -A prescription for Zepbound will be sent to you, as it is covered for sleep apnea. -Engage in resistance training to prevent muscle loss.    OBSTRUCTIVE SLEEP APNEA: You have a diagnosis of sleep apnea, and weight loss may help manage this condition.    HYPERLIPIDEMIA: Your cholesterol levels are being managed with atorvastatin  40 mg daily.  Ensure your cholesterol panel is completed with Dr. Pietro.

## 2023-11-07 NOTE — Progress Notes (Signed)
 Subjective:    Patient ID: Dale [name], Dale [name], male    DOB: 1950/11/25, 73 y.o.   MRN: [id number]  DOS:  11/07/2023 Follow-up  Discussed the use of AI scribe software for clinical note transcription with the patient, who gave verbal consent to proceed.  History of Present Illness Dale [name], Dale [name] is a 73 year old male who presents for follow-up after a recent fall and steroid injection.  Musculoskeletal pain and recent fall - Experienced significant discomfort following a recent fall - Received a steroid injection in the right trochanter yesterday, resulting in improved sleep - Uses ibuprofen 800 mg as needed for pain - Primarily using naproxen  200 mg daily since the fall, but feels this dose is subtherapeutic  Obesity - BMI is 36 - Weighs approximately 100 pounds above ideal body weight - Weight has remained stable over the years  Hyperlipidemia - Takes atorvastatin  40 mg daily - Pending blood draw for cholesterol levels  Obstructive sleep apnea - Has a diagnosis of sleep apnea  Medication use - Takes vitamin D regularly - Takes Flomax   Preventive care - Received a recent influenza vaccination - Considering COVID-19 vaccination    Review of Systems See above   Past Medical History:  Diagnosis Date   BCC (basal cell carcinoma of skin)    dr ivin   BPH (benign prostatic hyperplasia)    Decreased hearing    Left, saw audiologist in 2011 per pt    DJD (degenerative joint disease)    Elevated liver function tests    Hyperlipidemia    mixed   Hypertension    Obesity    Ocular rosacea    on doxy as of 01-2013   OSA (obstructive sleep apnea)    on CPAP   Tubular adenoma of colon 2017    Past Surgical History:  Procedure Laterality Date   COLONOSCOPY W/ POLYPECTOMY  10/20/2015   tubular adenoma   TONSILLECTOMY      Current Outpatient Medications  Medication Instructions   atorvastatin  (LIPITOR) 40 mg, Oral, Daily at bedtime    Cholecalciferol (VITAMIN D-3) 25 MCG (1000 UT) CAPS Take by mouth.   Cyanocobalamin  (B-12 PO) Take by mouth.   fish oil-omega-3 fatty acids 1 g, Daily   ibuprofen (ADVIL) 800 mg, Every 8 hours PRN   tamsulosin  (FLOMAX ) 0.8 mg, Daily       Objective:   Physical Exam BP 116/76   Pulse (!) 59   Temp 97.8 F (36.6 C) (Oral)   Resp 16   Ht 5' 10 (1.778 m)   Wt 254 lb 8 [name] (115.4 kg)   SpO2 97%   BMI 36.52 kg/m  General:   Well developed, NAD, BMI noted. HEENT:  Normocephalic . Face symmetric, atraumatic Lungs:  CTA B Normal respiratory effort, no intercostal retractions, no accessory muscle use. Heart: RRR,  no murmur.  Lower extremities: no pretibial edema bilaterally  Skin: Not pale. Not jaundice Neurologic:  alert & oriented X3.  Speech normal, gait appropriate for age and unassisted Psych--  Cognition and judgment appear intact.  Cooperative with normal attention span and concentration.  Behavior appropriate. No anxious or depressed appearing.      Assessment   Assessment Elevated BP Hyperlipidemia Coronary calcium  score 06/2020: Coronary calcium  score 1, 13 percentile Elevated LFTs DJD Weaker L leg, saw neuro, NCS 01/2018 chronic L 3 radiculopathy Morbid obesity: BMI 36 + OSA, dyslipidemia OSA, + CPAP BCC- sees derm    Ocular rosacea-  HOH, L  BPH w/LUTS sees urology Aortic ultrasound: 05/2020, no AAA   Assessment and Plan Assessment & Plan Right hip injury with trochanteric pain Since LOV, saw Ortho, just received a trochanteric bursa injection and is finally better. To start physical therapy soon.  Morbid obesity: BMI 36, dyslipidemia, OSA BMI 36. Discussed GLP-1 agonists   for weight management, potential benefits, and side effects. Emphasized resistance training to prevent muscle loss. - Send prescription for Zepbound, covered for sleep apnea. -If approved and if he decides to proceed, will let me know after the fourth injection if he likes to  increase the dose OSA: Good CPAP compliance.  See above High cholesterol: Saw cardiology, Dr. Pietro, 06/27/2023, LDL was 83, Lipitor was increased to 40 mg daily, plans to get a FLP and LFTs at cardiology. Managed with atorvastatin  40 mg. Pending cholesterol panel with cardiologist. RTC 4 to 5 months

## 2023-11-09 NOTE — Assessment & Plan Note (Signed)
 Right hip injury with trochanteric pain Had a fall, see LOV, subsequently saw Ortho, just received a trochanteric bursa injection and is finally better. To start physical therapy soon. Morbid obesity: BMI 36--dyslipidemia-- OSA Discussed GLP-1 agonists   for weight management, potential benefits, and side effects. Emphasized resistance training to prevent muscle loss. - Send prescription for Zepbound, covered for sleep apnea. -If approved and if he decides to proceed, will let me know after the fourth injection if he likes to increase the dose OSA: Good CPAP compliance.  See above High cholesterol: Saw cardiology, Dr. Pietro, 06/27/2023, LDL was 83, Lipitor was increased to 40 mg daily, plans to get a FLP and LFTs at cardiology. Managed with atorvastatin  40 mg. Pending cholesterol panel with cardiologist. RTC 4 to 5 months

## 2023-11-20 DIAGNOSIS — G8929 Other chronic pain: Secondary | ICD-10-CM | POA: Diagnosis not present

## 2023-11-20 DIAGNOSIS — M545 Low back pain, unspecified: Secondary | ICD-10-CM | POA: Diagnosis not present

## 2023-11-20 DIAGNOSIS — M25551 Pain in right hip: Secondary | ICD-10-CM | POA: Diagnosis not present

## 2023-11-20 DIAGNOSIS — M6281 Muscle weakness (generalized): Secondary | ICD-10-CM | POA: Diagnosis not present

## 2023-11-27 DIAGNOSIS — G8929 Other chronic pain: Secondary | ICD-10-CM | POA: Diagnosis not present

## 2023-11-27 DIAGNOSIS — M545 Low back pain, unspecified: Secondary | ICD-10-CM | POA: Diagnosis not present

## 2023-11-27 DIAGNOSIS — M6281 Muscle weakness (generalized): Secondary | ICD-10-CM | POA: Diagnosis not present

## 2023-11-27 DIAGNOSIS — M25551 Pain in right hip: Secondary | ICD-10-CM | POA: Diagnosis not present

## 2023-12-04 DIAGNOSIS — M25551 Pain in right hip: Secondary | ICD-10-CM | POA: Diagnosis not present

## 2023-12-04 DIAGNOSIS — M6281 Muscle weakness (generalized): Secondary | ICD-10-CM | POA: Diagnosis not present

## 2023-12-04 DIAGNOSIS — M545 Low back pain, unspecified: Secondary | ICD-10-CM | POA: Diagnosis not present

## 2023-12-04 DIAGNOSIS — G8929 Other chronic pain: Secondary | ICD-10-CM | POA: Diagnosis not present

## 2024-01-07 NOTE — Progress Notes (Deleted)
 PATIENT: Dale [name], DDS DOB: 1951-01-15  REASON FOR VISIT: follow up HISTORY FROM: patient  No chief complaint on file.    HISTORY OF PRESENT ILLNESS:  01/07/2024 ALL:  Dale [name], DDS is a 73 y.o. male here today for follow up for OSA on CPAP.    Set up?   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  The patient is unaccompanied today and presents for his yearly check up.  I last saw him on 12/27/2021, at which time he was doing well, he was compliant with treatment, machine was working fine.     Today, 01/02/2023: I reviewed his CPAP compliance data from 12/02/2022 through 12/31/2022, which is a total of 30 days, during which time he used his machine every night with percent use days greater than 4 hours at 100%, indicating superb compliance with an average usage of 7 hours and 45 minutes, residual AHI at goal at 0.4/h, leak acceptable, intact, no side, with the 95th percentile at 0.8 L/min on a pressure of 11 cm with EPR of 3. He reports doing well doing well.  Works keeps him very busy.  Other than that he is doing fine.  He has not had any trouble with his machine, no error message.  He reports full compliance and ongoing benefit.   REVIEW OF SYSTEMS: Out of a complete 14 system review of symptoms, the patient complains only of the following symptoms, and all other reviewed systems are negative.  ESS:  ALLERGIES: Allergies[1]  HOME MEDICATIONS: Outpatient Medications Prior to Visit  Medication Sig Dispense Refill   atorvastatin  (LIPITOR) 40 MG tablet Take 1 tablet (40 mg total) by mouth at bedtime. 90 tablet 3   Cholecalciferol (VITAMIN  D-3) 25 MCG (1000 UT) CAPS Take by mouth.     Cyanocobalamin  (B-12 PO) Take by mouth.     fish oil-omega-3 fatty acids 1000 MG capsule Take 1 g by mouth daily.     ibuprofen (ADVIL,MOTRIN) 800 MG tablet Take 800 mg by mouth every 8 (eight) hours as needed.     tamsulosin  (FLOMAX ) 0.4 MG CAPS capsule Take 0.8 mg by mouth daily.      tirzepatide  (ZEPBOUND ) 2.5 MG/0.5ML Pen Inject 2.5 mg into the skin once a week. 2 mL 0   No facility-administered medications prior to visit.    PAST MEDICAL HISTORY: Past Medical History:  Diagnosis Date   BCC (basal cell carcinoma of skin)    dr ivin   BPH (benign prostatic hyperplasia)    Decreased hearing    Left, saw audiologist in 2011 per pt    DJD (degenerative joint disease)    Elevated liver function tests    Hyperlipidemia    mixed   Hypertension    Obesity    Ocular rosacea    on doxy as of 01-2013   OSA (obstructive sleep apnea)    on CPAP   Tubular adenoma of colon 2017    PAST SURGICAL HISTORY: Past Surgical History:  Procedure Laterality Date   COLONOSCOPY W/ POLYPECTOMY  10/20/2015   tubular adenoma   TONSILLECTOMY      FAMILY HISTORY: Family History  Problem Relation Age of Onset   Diabetes Mother    Hypertension Mother  Prostate cancer Father 15       age 92s died at 42, also an UNCLE   Heart failure Maternal Grandmother    Coronary artery disease Paternal Grandmother    Esophageal cancer Paternal Grandfather    Colon cancer Neg Hx    Stroke Neg Hx    Sleep apnea Neg Hx     SOCIAL HISTORY: Social History   Socioeconomic History   Marital status: Married    Spouse name: Not on file   Number of children: 3   Years of education: Not on file   Highest education level: Professional school degree (e.g., MD, DDS, DVM, JD)  Occupational History   Occupation: dentist  Tobacco Use   Smoking status: Former    Current packs/day: 0.00    Types: Cigarettes    Quit date: 11/21/1968    Years since quitting: 55.1    Smokeless tobacco: Never   Tobacco comments:    quit late 70s  Vaping Use   Vaping status: Never Used  Substance and Sexual Activity   Alcohol use: Yes    Comment: 2 cocktails per week   Drug use: No   Sexual activity: Not on file  Other Topics Concern   Not on file  Social History Narrative   Lives with his wife in a 2 story home with a basement.  Has 3 children.  Works as a education officer, community.  Education: freight forwarder.    Social Drivers of Health   Tobacco Use: Medium Risk (11/07/2023)   Patient History    Smoking Tobacco Use: Former    Smokeless Tobacco Use: Never    Passive Exposure: Not on file  Financial Resource Strain: Low Risk (01/25/2021)   Overall Financial Resource Strain (CARDIA)    Difficulty of Paying Living Expenses: Not hard at all  Food Insecurity: No Food Insecurity (11/07/2022)   Hunger Vital Sign    Worried About Running Out of Food in the Last Year: Never true    Ran Out of Food in the Last Year: Never true  Transportation Needs: No Transportation Needs (11/07/2022)   PRAPARE - Administrator, Civil Service (Medical): No    Lack of Transportation (Non-Medical): No  Physical Activity: Unknown (11/07/2022)   Exercise Vital Sign    Days of Exercise per Week: 0 days    Minutes of Exercise per Session: Not on file  Stress: No Stress Concern Present (11/07/2022)   [employer] of Occupational Health - Occupational Stress Questionnaire    Feeling of Stress : Only a little  Social Connections: Unknown (11/07/2022)   Social Connection and Isolation Panel    Frequency of Communication with Friends and Family: Once a week    Frequency of Social Gatherings with Friends and Family: Patient declined    Attends Religious Services: Never    Database Administrator or Organizations: No    Attends Engineer, Structural: Not on file    Marital Status: Married  Intimate Partner Violence: Unknown (04/25/2021)   Received from [facility]   HITS     Physically Hurt: Not on file    Insult or Talk Down To: Not on file    Threaten Physical Harm: Not on file    Scream or Curse: Not on file  Depression (PHQ2-9): Low Risk (10/20/2023)   Depression (PHQ2-9)    PHQ-2 Score: 2  Alcohol Screen: Low Risk (11/07/2022)   Alcohol Screen    Last Alcohol Screening Score (AUDIT): 3  Housing:  Low Risk (11/07/2022)   Housing    Last Housing Risk Score: 0  Utilities: Not on file  Health Literacy: Not on file     PHYSICAL EXAM  There were no vitals filed for this visit. There is no height or weight on file to calculate BMI.  Generalized: Well developed, in no acute distress  Cardiology: normal rate and rhythm, no murmur noted Respiratory: clear to auscultation bilaterally  Neurological examination  Mentation: Alert oriented to time, place, history taking. Follows all commands speech and language fluent Cranial nerve II-XII: Pupils were equal round reactive to light. Extraocular movements were full, visual field were full  Motor: The motor testing reveals 5 over 5 strength of all 4 extremities. Good symmetric motor tone is noted throughout.  Gait and station: Gait is normal.    DIAGNOSTIC DATA (LABS, IMAGING, TESTING) - I reviewed patient records, labs, notes, testing and imaging myself where available.      No data to display           Lab Results  Component Value Date   WBC 5.7 05/24/2022   HGB 13.5 05/24/2022   HCT 40.2 05/24/2022   MCV 85.4 05/24/2022   PLT 164.0 05/24/2022      Component Value Date/Time   NA 142 05/24/2022 0923   K 4.3 05/24/2022 0923   CL 106 05/24/2022 0923   CO2 29 05/24/2022 0923   GLUCOSE 81 05/24/2022 0923   BUN 12 05/24/2022 0923   CREATININE 0.91 05/24/2022 0923   CALCIUM  9.1 05/24/2022 0923   PROT 6.9 11/09/2021 0924   ALBUMIN 4.5 11/09/2021 0924   AST 40 (H) 11/08/2022 0905   ALT 44 11/08/2022 0905   ALKPHOS 53 11/09/2021 0924   BILITOT 1.1 11/09/2021 0924   GFRNONAA >60 01/01/2021 1435    GFRAA 111 02/20/2007 0956   Lab Results  Component Value Date   CHOL 155 11/08/2022   HDL 43.00 11/08/2022   LDLCALC 83 11/08/2022   LDLDIRECT 168.0 12/09/2008   TRIG 148.0 11/08/2022   CHOLHDL 4 11/08/2022   Lab Results  Component Value Date   HGBA1C 5.5 12/22/2020   Lab Results  Component Value Date   VITAMINB12 479 05/14/2019   Lab Results  Component Value Date   TSH 2.26 11/09/2021     ASSESSMENT AND PLAN 72 y.o. year old male  has a past medical history of BCC (basal cell carcinoma of skin), BPH (benign prostatic hyperplasia), Decreased hearing, DJD (degenerative joint disease), Elevated liver function tests, Hyperlipidemia, Hypertension, Obesity, Ocular rosacea, OSA (obstructive sleep apnea), and Tubular adenoma of colon (2017). here with   No diagnosis found.    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 Compliance report reveals ***. *** was encouraged to continue using CPAP nightly and for greater than 4 hours each night. We will update supply orders as indicated. Risks of untreated sleep apnea review and education materials provided. Healthy lifestyle habits encouraged. *** will follow up in ***, sooner if needed. *** verbalizes understanding and agreement with this plan.    No orders of the defined types were placed in this encounter.    No orders of the defined types were placed in this encounter.     Greig Forbes, FNP-C 01/07/2024, 9:33 AM [facility] [address], [address] [place], [place] [address] [phone]     [1] No Known Allergies

## 2024-01-07 NOTE — Progress Notes (Deleted)
 SABRA

## 2024-01-08 ENCOUNTER — Encounter: Payer: Self-pay | Admitting: Family Medicine

## 2024-01-08 ENCOUNTER — Ambulatory Visit: Payer: Medicare Other | Admitting: Family Medicine

## 2024-01-12 ENCOUNTER — Ambulatory Visit: Admitting: Family Medicine

## 2024-04-09 ENCOUNTER — Ambulatory Visit: Admitting: Internal Medicine

## 2024-04-09 ENCOUNTER — Ambulatory Visit

## 2024-05-06 ENCOUNTER — Ambulatory Visit: Admitting: Neurology
